# Patient Record
Sex: Female | Born: 1975 | Race: White | Hispanic: No | Marital: Married | Smoking: Former smoker
Health system: Southern US, Community
[De-identification: ages and names within clinical notes are randomized; demographics above are authoritative.]

## PROBLEM LIST (undated history)

## (undated) DIAGNOSIS — E162 Hypoglycemia, unspecified: Secondary | ICD-10-CM

## (undated) DIAGNOSIS — I10 Essential (primary) hypertension: Secondary | ICD-10-CM

## (undated) DIAGNOSIS — J302 Other seasonal allergic rhinitis: Secondary | ICD-10-CM

## (undated) DIAGNOSIS — G43109 Migraine with aura, not intractable, without status migrainosus: Secondary | ICD-10-CM

## (undated) DIAGNOSIS — A630 Anogenital (venereal) warts: Secondary | ICD-10-CM

## (undated) DIAGNOSIS — J45909 Unspecified asthma, uncomplicated: Secondary | ICD-10-CM

## (undated) DIAGNOSIS — Z973 Presence of spectacles and contact lenses: Secondary | ICD-10-CM

## (undated) DIAGNOSIS — E236 Other disorders of pituitary gland: Secondary | ICD-10-CM

## (undated) DIAGNOSIS — T7840XA Allergy, unspecified, initial encounter: Secondary | ICD-10-CM

## (undated) DIAGNOSIS — F419 Anxiety disorder, unspecified: Secondary | ICD-10-CM

## (undated) DIAGNOSIS — F32A Depression, unspecified: Secondary | ICD-10-CM

## (undated) DIAGNOSIS — K219 Gastro-esophageal reflux disease without esophagitis: Secondary | ICD-10-CM

## (undated) DIAGNOSIS — D649 Anemia, unspecified: Secondary | ICD-10-CM

## (undated) DIAGNOSIS — R002 Palpitations: Secondary | ICD-10-CM

## (undated) DIAGNOSIS — J189 Pneumonia, unspecified organism: Secondary | ICD-10-CM

## (undated) HISTORY — DX: Unspecified asthma, uncomplicated: J45.909

## (undated) HISTORY — DX: Allergy, unspecified, initial encounter: T78.40XA

## (undated) HISTORY — DX: Depression, unspecified: F32.A

## (undated) HISTORY — DX: Hypoglycemia, unspecified: E16.2

## (undated) HISTORY — DX: Migraine with aura, not intractable, without status migrainosus: G43.109

## (undated) HISTORY — PX: APPENDECTOMY: SHX54

## (undated) HISTORY — DX: Anogenital (venereal) warts: A63.0

## (undated) HISTORY — DX: Essential (primary) hypertension: I10

## (undated) HISTORY — DX: Gastro-esophageal reflux disease without esophagitis: K21.9

## (undated) HISTORY — PX: TONSILLECTOMY AND ADENOIDECTOMY: SUR1326

## (undated) HISTORY — DX: Anemia, unspecified: D64.9

## (undated) HISTORY — DX: Anxiety disorder, unspecified: F41.9

## (undated) HISTORY — DX: Other seasonal allergic rhinitis: J30.2

## (undated) HISTORY — PX: ABDOMINAL HYSTERECTOMY: SHX81

---
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
## 2001-04-24 HISTORY — PX: APPENDECTOMY: SHX54

## 2018-05-14 ENCOUNTER — Other Ambulatory Visit: Payer: Self-pay | Admitting: Family Medicine

## 2018-05-14 DIAGNOSIS — Z1231 Encounter for screening mammogram for malignant neoplasm of breast: Secondary | ICD-10-CM

## 2018-06-19 ENCOUNTER — Ambulatory Visit
Admission: RE | Admit: 2018-06-19 | Discharge: 2018-06-19 | Disposition: A | Discharge status: Home / Self Care | Payer: BLUE CROSS/BLUE SHIELD | Source: Ambulatory Visit | Attending: Family Medicine | Admitting: Family Medicine

## 2018-06-19 DIAGNOSIS — Z1231 Encounter for screening mammogram for malignant neoplasm of breast: Secondary | ICD-10-CM

## 2018-07-03 DIAGNOSIS — Z0001 Encounter for general adult medical examination with abnormal findings: Secondary | ICD-10-CM | POA: Diagnosis not present

## 2018-07-07 LAB — HM PAP SMEAR

## 2018-10-28 ENCOUNTER — Other Ambulatory Visit: Payer: Self-pay

## 2018-10-28 ENCOUNTER — Encounter: Payer: Self-pay | Admitting: Obstetrics and Gynecology

## 2018-10-28 ENCOUNTER — Ambulatory Visit: Payer: BC Managed Care – PPO | Admitting: Obstetrics and Gynecology

## 2018-10-28 VITALS — BP 120/80 | HR 80 | Temp 97.9°F | Resp 14 | Ht 65.25 in | Wt 180.0 lb

## 2018-10-28 DIAGNOSIS — R140 Abdominal distension (gaseous): Secondary | ICD-10-CM | POA: Diagnosis not present

## 2018-10-28 DIAGNOSIS — R1030 Lower abdominal pain, unspecified: Secondary | ICD-10-CM | POA: Diagnosis not present

## 2018-10-28 LAB — POCT URINALYSIS DIPSTICK
Bilirubin, UA: NEGATIVE
Blood, UA: NEGATIVE
Glucose, UA: NEGATIVE
Ketones, UA: NEGATIVE
Leukocytes, UA: NEGATIVE
Nitrite, UA: NEGATIVE
Protein, UA: NEGATIVE
Urobilinogen, UA: 0.2 E.U./dL
pH, UA: 5 (ref 5.0–8.0)

## 2018-10-28 MED ORDER — NORETHINDRONE 0.35 MG PO TABS: 1.0000 | ORAL_TABLET | Freq: Every day | ORAL | 0 refills | Status: DC

## 2018-10-28 NOTE — Patient Instructions (Signed)
Endometriosis  Endometriosis is a condition in which the tissue that lines the uterus (endometrium) grows outside of its normal location. The tissue may grow in many locations close to the uterus, but it commonly grows on the ovaries, fallopian tubes, vagina, or bowel. When the uterus sheds the endometrium every menstrual cycle, there is bleeding wherever the endometrial tissue is located. This can cause pain because blood is irritating to tissues that are not normally exposed to it. What are the causes? The cause of endometriosis is not known. What increases the risk? You may be more likely to develop endometriosis if you:  Have a family history of endometriosis.  Have never given birth.  Started your period at age 10 or younger.  Have high levels of estrogen in your body.  Were exposed to a certain medicine (diethylstilbestrol) before you were born (in utero).  Had low birth weight.  Were born as a twin, triplet, or other multiple.  Have a BMI of less than 25. BMI is an estimate of body fat and is calculated from height and weight. What are the signs or symptoms? Often, there are no symptoms of this condition. If you do have symptoms, they may:  Vary depending on where your endometrial tissue is growing.  Occur during your menstrual period (most common) or midcycle.  Come and go, or you may go months with no symptoms at all.  Stop with menopause. Symptoms may include:  Pain in the back or abdomen.  Heavier bleeding during periods.  Pain during sex.  Painful bowel movements.  Infertility.  Pelvic pain.  Bleeding more than once a month. How is this diagnosed? This condition is diagnosed based on your symptoms and a physical exam. You may have tests, such as:  Blood tests and urine tests. These may be done to help rule out other possible causes of your symptoms.  Ultrasound, to look for abnormal tissues.  An X-ray of the lower bowel (barium enema).  An  ultrasound that is done through the vagina (transvaginally).  CT scan.  MRI.  Laparoscopy. In this procedure, a lighted, pencil-sized instrument called a laparoscope is inserted into your abdomen through an incision. The laparoscope allows your health care provider to look at the organs inside your body and check for abnormal tissue to confirm the diagnosis. If abnormal tissue is found, your health care provider may remove a small piece of tissue (biopsy) to be examined under a microscope. How is this treated? Treatment for this condition may include:  Medicines to relieve pain, such as NSAIDs.  Hormone therapy. This involves using artificial (synthetic) hormones to reduce endometrial tissue growth. Your health care provider may recommend using a hormonal form of birth control, or other medicines.  Surgery. This may be done to remove abnormal endometrial tissue. ? In some cases, tissue may be removed using a laparoscope and a laser (laparoscopic laser treatment). ? In severe cases, surgery may be done to remove the fallopian tubes, uterus, and ovaries (hysterectomy). Follow these instructions at home:  Take over-the-counter and prescription medicines only as told by your health care provider.  Do not drive or use heavy machinery while taking prescription pain medicine.  Try to avoid activities that cause pain, including sexual activity.  Keep all follow-up visits as told by your health care provider. This is important. Contact a health care provider if:  You have pain in the area between your hip bones (pelvic area) that occurs: ? Before, during, or after your period. ?   In between your period and gets worse during your period. ? During or after sex. ? With bowel movements or urination, especially during your period.  You have problems getting pregnant.  You have a fever. Get help right away if:  You have severe pain that does not get better with medicine.  You have severe  nausea and vomiting, or you cannot eat without vomiting.  You have pain that affects only the lower, right side of your abdomen.  You have abdominal pain that gets worse.  You have abdominal swelling.  You have blood in your stool. This information is not intended to replace advice given to you by your health care provider. Make sure you discuss any questions you have with your health care provider. Document Released: 04/07/2000 Document Revised: 03/23/2017 Document Reviewed: 09/11/2015 Elsevier Patient Education  2020 [place]. Pelvic Pain, Female Pelvic pain is pain in your lower abdomen, below your belly button and between your hips. The pain may start suddenly (be acute), keep coming back (be recurring), or last a long time (become chronic). Pelvic pain that lasts longer than 6 months is considered chronic. Pelvic pain may affect your:  Reproductive organs.  Urinary system.  Digestive tract.  Musculoskeletal system. There are many potential causes of pelvic pain. Sometimes, the pain can be a result of digestive or urinary conditions, strained muscles or ligaments, or reproductive conditions. Sometimes the cause of pelvic pain is not known. Follow these instructions at home:   Take over-the-counter and prescription medicines only as told by your health care provider.  Rest as told by your health care provider.  Do not have sex if it hurts.  Keep a journal of your pelvic pain. Write down: ? When the pain started. ? Where the pain is located. ? What seems to make the pain better or worse, such as food or your period (menstrual cycle). ? Any symptoms you have along with the pain.  Keep all follow-up visits as told by your health care provider. This is important. Contact a health care provider if:  Medicine does not help your pain.  Your pain comes back.  You have new symptoms.  You have abnormal vaginal discharge or bleeding, including bleeding after menopause.   You have a fever or chills.  You are constipated.  You have blood in your urine or stool.  You have foul-smelling urine.  You feel weak or light-headed. Get help right away if:  You have sudden severe pain.  Your pain gets steadily worse.  You have severe pain along with fever, nausea, vomiting, or excessive sweating.  You lose consciousness. Summary  Pelvic pain is pain in your lower abdomen, below your belly button and between your hips.  There are many potential causes of pelvic pain.  Keep a journal of your pelvic pain. This information is not intended to replace advice given to you by your health care provider. Make sure you discuss any questions you have with your health care provider. Document Released: 03/07/2004 Document Revised: 09/26/2017 Document Reviewed: 09/26/2017 Elsevier Patient Education  2020 [employer].

## 2018-10-28 NOTE — Progress Notes (Signed)
GYNECOLOGY  VISIT   HPI: 43 y.o.   Married  Caucasian  female   G2P0002 with Patient's last menstrual period was 10/07/2018.   here as a new patient for lower abdominal and pelvic pain with bloating. Some frequency with urination.    States her symptoms since the beginning of the year.  RLQ pain since pandemic started.  Increased physical activity makes her aware that something is going on.   Some nausea with started her new OCP.  No vomiting.  No diarrhea.  Off and constipation, but not currently.  Lots of heartburn.   States she is not hungry ever.  No aversion to food.  Maintaining her weight.  She feels like her legs are thinning but the scale is the same.  She is running a lot.   Taking OCPs, low dose COCs, for the last 3 weeks.  She is spotting in between periods.  She has switched her pills to try to improve her cycles.  She states she feels like she ovulates every month.   She did a trial of Camilla, but it did not help enough.  She had mood swings and huge clots.  She does not want an IUD.   Had a pelvic US 1.5 years ago and it was normal.   No change in partner.  Same partner for 20 years.   She takes iron and vitamin D every day due to deficiencies of these.  She had labs with Dr. Darron Doom in Jan or Feb, and these were normal.  Vaginal delivery x 2.   Was living in [place] for many years.   Urine: negative  GYNECOLOGIC HISTORY: Patient's last menstrual period was 10/07/2018. Contraception:  Sronyx Menopausal hormone therapy:  none Last mammogram:  06/19/18 BIRADS 1 negative/density b Last pap smear:  About February 2020; normal per patient; done at [facility].         OB History    Gravida  2   Para  2   Term  0   Preterm  0   AB  0   Living  2     SAB  0   TAB  0   Ectopic  0   Multiple  0   Live Births  0              There are no active problems to display for this patient.   Past Medical History:   Diagnosis Date  . Genital warts    as a teen  . Hypoglycemia     Past Surgical History:  Procedure Laterality Date  . APPENDECTOMY    . TONSILLECTOMY AND ADENOIDECTOMY      Current Outpatient Medications  Medication Sig Dispense Refill  . Multiple Vitamin (MULTIVITAMIN) tablet Take 1 tablet by mouth daily.    . SRONYX 0.1-20 MG-MCG tablet Take 1 tablet by mouth daily.     No current facility-administered medications for this visit.      ALLERGIES: Erythromycin  Family History  Problem Relation Age of Onset  . Breast cancer Maternal Aunt   . Diabetes Maternal Aunt   . Hypertension Maternal Aunt   . Breast cancer Maternal Grandmother   . Breast cancer Paternal Grandmother   . Heart attack Father     Social History   Socioeconomic History  . Marital status: Married    Spouse name: Not on file  . Number of children: Not on file  . Years of education: Not on file  .  Highest education level: Not on file  Occupational History  . Not on file  Social Needs  . Financial resource strain: Not on file  . Food insecurity    Worry: Not on file    Inability: Not on file  . Transportation needs    Medical: Not on file    Non-medical: Not on file  Tobacco Use  . Smoking status: Former Smoker    Types: Cigarettes    Quit date: 04/24/2005    Years since quitting: 13.5  . Smokeless tobacco: Never Used  Substance and Sexual Activity  . Alcohol use: Yes    Comment: occ glass of wine  . Drug use: Never  . Sexual activity: Yes    Birth control/protection: Pill, Surgical    Comment: Sronyx/vasectomy  Lifestyle  . Physical activity    Days per week: Not on file    Minutes per session: Not on file  . Stress: Not on file  Relationships  . Social Herbalist on phone: Not on file    Gets together: Not on file    Attends religious service: Not on file    Active member of club or organization: Not on file    Attends meetings of clubs or organizations: Not on file     Relationship status: Not on file  . Intimate partner violence    Fear of current or ex partner: Not on file    Emotionally abused: Not on file    Physically abused: Not on file    Forced sexual activity: Not on file  Other Topics Concern  . Not on file  Social History Narrative  . Not on file    Review of Systems  Constitutional: Negative.   HENT: Negative.   Eyes: Negative.   Respiratory: Negative.   Cardiovascular: Negative.   Gastrointestinal: Positive for abdominal pain.  Endocrine: Negative.   Genitourinary: Positive for frequency.  Musculoskeletal: Negative.   Skin: Negative.   Allergic/Immunologic: Negative.   Neurological: Negative.   Hematological: Negative.   Psychiatric/Behavioral: Negative.     PHYSICAL EXAMINATION:    BP 120/80 (BP Location: Right Arm, Patient Position: Sitting, Cuff Size: Normal)   Pulse 80   Temp 97.9 F (36.6 C) (Temporal)   Resp 14   Ht 5' 5.25" (1.657 m)   Wt 180 lb (81.6 kg)   LMP 10/07/2018   BMI 29.72 kg/m     General appearance: alert, cooperative and appears stated age Head: Normocephalic, without obvious abnormality, atraumatic Neck: no adenopathy, supple, symmetrical, trachea midline and thyroid normal to inspection and palpation Lungs: clear to auscultation bilaterally Heart: regular rate and rhythm Abdomen: soft, non-tender, no masses,  no organomegaly Extremities: extremities normal, atraumatic, no cyanosis or edema Skin: Skin color, texture, turgor normal. No rashes or lesions No abnormal inguinal nodes palpated Neurologic: Grossly normal  Pelvic: External genitalia:  no lesions              Urethra:  normal appearing urethra with no masses, tenderness or lesions              Bartholins and Skenes: normal                 Vagina: normal appearing vagina with normal color and discharge, no lesions              Cervix: no lesions                Bimanual Exam:  Uterus:  normal size, contour, position, consistency,  mobility, non-tender              Adnexa: no mass, fullness, tenderness          Chaperone was present for exam.  ASSESSMENT  Pelvic pain.  RLQ pain.  Abdominal bloating.  Migraine with aura.   PLAN  We discussed pelvic pain and possible etiologies.  We focused on possible endometriosis.  Stop combined oral contraceptives due to hx of migraine with aura and increased risk of stroke. Return to Micronor, at least temporarily. Alternatives discussed.   Return for pelvic ultrasound.    An After Visit Summary was printed and given to the patient.  __45____ minutes face to face time of which over 50% was spent in counseling.

## 2018-10-29 ENCOUNTER — Other Ambulatory Visit: Payer: Self-pay

## 2018-10-31 ENCOUNTER — Ambulatory Visit (INDEPENDENT_AMBULATORY_CARE_PROVIDER_SITE_OTHER): Payer: BC Managed Care – PPO

## 2018-10-31 ENCOUNTER — Other Ambulatory Visit: Payer: Self-pay

## 2018-10-31 ENCOUNTER — Ambulatory Visit (INDEPENDENT_AMBULATORY_CARE_PROVIDER_SITE_OTHER): Payer: BC Managed Care – PPO | Admitting: Obstetrics and Gynecology

## 2018-10-31 VITALS — BP 126/62 | HR 59 | Ht 65.25 in | Wt 180.2 lb

## 2018-10-31 DIAGNOSIS — R102 Pelvic and perineal pain: Secondary | ICD-10-CM | POA: Diagnosis not present

## 2018-10-31 DIAGNOSIS — N926 Irregular menstruation, unspecified: Secondary | ICD-10-CM | POA: Diagnosis not present

## 2018-10-31 DIAGNOSIS — R1030 Lower abdominal pain, unspecified: Secondary | ICD-10-CM

## 2018-10-31 DIAGNOSIS — R140 Abdominal distension (gaseous): Secondary | ICD-10-CM

## 2018-10-31 DIAGNOSIS — R1031 Right lower quadrant pain: Secondary | ICD-10-CM

## 2018-10-31 MED ORDER — NORETHINDRONE 0.35 MG PO TABS: 1.0000 | ORAL_TABLET | Freq: Every day | ORAL | 0 refills | Status: DC

## 2018-10-31 NOTE — Progress Notes (Signed)
GYNECOLOGY  VISIT   HPI: 43 y.o.   Married  Caucasian  female   G2P0002 with Patient's last menstrual period was 10/07/2018.   here for   Pelvic ultrasound   Has RLQ pain since beginning of year.  Also reports pelvic pain and bloating.  She was on COCs but she has migraine with aura, so I recommend she stop this.   She used Camilla in the past and still had clots and mood swings.  She received an Rx for this at her office visit here this week.   This was to get her through until her [place] visit, which turned out to be today.   She has had some irregular bleeding when she was using her recent low dose COCs.  GYNECOLOGIC HISTORY: Patient's last menstrual period was 10/07/2018. Contraception:  POP. Menopausal hormone therapy:  none Last mammogram:  06/19/18 Bi-rads 1 Neg  Last pap smear:  10/28/18 not resulted.          OB History    Gravida  2   Para  2   Term  0   Preterm  0   AB  0   Living  2     SAB  0   TAB  0   Ectopic  0   Multiple  0   Live Births  0              There are no active problems to display for this patient.   Past Medical History:  Diagnosis Date  . Genital warts    as a teen  . Hypoglycemia     Past Surgical History:  Procedure Laterality Date  . APPENDECTOMY    . TONSILLECTOMY AND ADENOIDECTOMY      Current Outpatient Medications  Medication Sig Dispense Refill  . Multiple Vitamin (MULTIVITAMIN) tablet Take 1 tablet by mouth daily.    . norethindrone (MICRONOR) 0.35 MG tablet Take 1 tablet (0.35 mg total) by mouth daily. 1 Package 0   No current facility-administered medications for this visit.      ALLERGIES: Erythromycin  Family History  Problem Relation Age of Onset  . Breast cancer Maternal Aunt   . Diabetes Maternal Aunt   . Hypertension Maternal Aunt   . Breast cancer Maternal Grandmother   . Breast cancer Paternal Grandmother   . Heart attack Father     Social History   Socioeconomic History  . Marital  status: Married    Spouse name: Not on file  . Number of children: Not on file  . Years of education: Not on file  . Highest education level: Not on file  Occupational History  . Not on file  Social Needs  . Financial resource strain: Not on file  . Food insecurity    Worry: Not on file    Inability: Not on file  . Transportation needs    Medical: Not on file    Non-medical: Not on file  Tobacco Use  . Smoking status: Former Smoker    Types: Cigarettes    Quit date: 04/24/2005    Years since quitting: 13.5  . Smokeless tobacco: Never Used  Substance and Sexual Activity  . Alcohol use: Yes    Comment: occ glass of wine  . Drug use: Never  . Sexual activity: Yes    Birth control/protection: Pill, Surgical    Comment: Sronyx/vasectomy  Lifestyle  . Physical activity    Days per week: Not on file  Minutes per session: Not on file  . Stress: Not on file  Relationships  . Social Herbalist on phone: Not on file    Gets together: Not on file    Attends religious service: Not on file    Active member of club or organization: Not on file    Attends meetings of clubs or organizations: Not on file    Relationship status: Not on file  . Intimate partner violence    Fear of current or ex partner: Not on file    Emotionally abused: Not on file    Physically abused: Not on file    Forced sexual activity: Not on file  Other Topics Concern  . Not on file  Social History Narrative  . Not on file    Review of Systems  See HPI.  PHYSICAL EXAMINATION:    BP 126/62   Pulse (!) 59   Ht 5' 5.25" (1.657 m)   Wt 180 lb 3.2 oz (81.7 kg)   LMP 10/07/2018   SpO2 97%   BMI 29.76 kg/m     General appearance: alert, cooperative and appears stated age  Pelvic US Uterus no masses. EMS 3.67 mm. Left ovary with 21 mm cyst consistent with hemorrhagic CL.  Right ovary normal.  No free fluid.   ASSESSMENT RLQ pain.  Hx appendectomy.  Pelvic pain.  Bloating.  Irregular  menses.  Migraine with aura.   PLAN  We discussed her normal ultrasound findings but still the possibility of endometriosis.  Rx for Camilla 3 month.  Return for EMB if irregular bleeding persists.  She will call back in 3 months to give a progress report.   An After Visit Summary was printed and given to the patient.  __15____ minutes face to face time of which over 50% was spent in counseling.

## 2018-10-31 NOTE — Progress Notes (Signed)
Encounter reviewed by Dr. Aundria Rud.    Addendum; the left ovary w/ thin walled cyst - 21 mm mean,  containing scant debris, all avascular, c/w hemorrhagic c.l.

## 2018-11-03 ENCOUNTER — Encounter: Payer: Self-pay | Admitting: Obstetrics and Gynecology

## 2018-12-16 ENCOUNTER — Other Ambulatory Visit: Payer: Self-pay | Admitting: Obstetrics and Gynecology

## 2018-12-16 ENCOUNTER — Encounter: Payer: Self-pay | Admitting: Obstetrics and Gynecology

## 2018-12-17 MED ORDER — NORETHINDRONE 0.35 MG PO TABS: 1.0000 | ORAL_TABLET | Freq: Every day | ORAL | 0 refills | Status: DC

## 2018-12-17 NOTE — Telephone Encounter (Signed)
Call placed to [facility], spoke with Verdis Frederickson. Confirmed 2 refills on file for Micronor; however, their system is reporting an error, unable to fill, new RX is needed for 2 packages. 10/31/18 Rx cancelled for remaining 2 packages, new RX sent for Micronor 0.35 mg packages #2/0RF.     Call placed to patient. Patient states she is in her 2nd week of her second pack. One pack was sent on 7/6 and 3pkg sent on 7/9. Advised as seen above per pharmacy, patient to f/u with pharmacy for filling, return call to office if any further assistance is needed. OV scheduled for 3 mo f/u on 01/23/19 at 4pm with Dr. Quincy Simmonds.   Routing to provider for final review. Patient is agreeable to disposition. Will close encounter.

## 2018-12-17 NOTE — Telephone Encounter (Signed)
Hi! I wanted to let you know the birth control pills seem to be good. I haven't had any issues so far and my period was better this last month. Also less pain. Can I get a refill on them to last until a yearly visit?  Thank you!  Shirley Fleming

## 2018-12-17 NOTE — Telephone Encounter (Signed)
Please check on the status of this request.  I sent a 3 month prescription in to this pharmacy in July.  Patient needs a recheck prior to finishing her third pack of the Micronor.

## 2018-12-23 DIAGNOSIS — M530 Cervicocranial syndrome: Secondary | ICD-10-CM | POA: Diagnosis not present

## 2018-12-23 DIAGNOSIS — M9901 Segmental and somatic dysfunction of cervical region: Secondary | ICD-10-CM | POA: Diagnosis not present

## 2018-12-24 DIAGNOSIS — M530 Cervicocranial syndrome: Secondary | ICD-10-CM | POA: Diagnosis not present

## 2018-12-24 DIAGNOSIS — M9901 Segmental and somatic dysfunction of cervical region: Secondary | ICD-10-CM | POA: Diagnosis not present

## 2018-12-25 DIAGNOSIS — M9901 Segmental and somatic dysfunction of cervical region: Secondary | ICD-10-CM | POA: Diagnosis not present

## 2018-12-25 DIAGNOSIS — M530 Cervicocranial syndrome: Secondary | ICD-10-CM | POA: Diagnosis not present

## 2018-12-31 DIAGNOSIS — M530 Cervicocranial syndrome: Secondary | ICD-10-CM | POA: Diagnosis not present

## 2018-12-31 DIAGNOSIS — M9901 Segmental and somatic dysfunction of cervical region: Secondary | ICD-10-CM | POA: Diagnosis not present

## 2019-01-01 DIAGNOSIS — M530 Cervicocranial syndrome: Secondary | ICD-10-CM | POA: Diagnosis not present

## 2019-01-01 DIAGNOSIS — M9901 Segmental and somatic dysfunction of cervical region: Secondary | ICD-10-CM | POA: Diagnosis not present

## 2019-01-06 DIAGNOSIS — M530 Cervicocranial syndrome: Secondary | ICD-10-CM | POA: Diagnosis not present

## 2019-01-06 DIAGNOSIS — M9901 Segmental and somatic dysfunction of cervical region: Secondary | ICD-10-CM | POA: Diagnosis not present

## 2019-01-08 DIAGNOSIS — M9901 Segmental and somatic dysfunction of cervical region: Secondary | ICD-10-CM | POA: Diagnosis not present

## 2019-01-08 DIAGNOSIS — M530 Cervicocranial syndrome: Secondary | ICD-10-CM | POA: Diagnosis not present

## 2019-01-13 DIAGNOSIS — M530 Cervicocranial syndrome: Secondary | ICD-10-CM | POA: Diagnosis not present

## 2019-01-13 DIAGNOSIS — M9901 Segmental and somatic dysfunction of cervical region: Secondary | ICD-10-CM | POA: Diagnosis not present

## 2019-01-15 DIAGNOSIS — M530 Cervicocranial syndrome: Secondary | ICD-10-CM | POA: Diagnosis not present

## 2019-01-15 DIAGNOSIS — M9901 Segmental and somatic dysfunction of cervical region: Secondary | ICD-10-CM | POA: Diagnosis not present

## 2019-01-20 DIAGNOSIS — M9901 Segmental and somatic dysfunction of cervical region: Secondary | ICD-10-CM | POA: Diagnosis not present

## 2019-01-20 DIAGNOSIS — M530 Cervicocranial syndrome: Secondary | ICD-10-CM | POA: Diagnosis not present

## 2019-01-21 ENCOUNTER — Other Ambulatory Visit: Payer: Self-pay

## 2019-01-23 ENCOUNTER — Encounter: Payer: Self-pay | Admitting: Obstetrics and Gynecology

## 2019-01-23 ENCOUNTER — Ambulatory Visit: Payer: BC Managed Care – PPO | Admitting: Obstetrics and Gynecology

## 2019-01-23 ENCOUNTER — Other Ambulatory Visit: Payer: Self-pay

## 2019-01-23 VITALS — BP 120/74 | HR 70 | Temp 98.1°F | Ht 65.25 in | Wt 181.0 lb

## 2019-01-23 DIAGNOSIS — Z3041 Encounter for surveillance of contraceptive pills: Secondary | ICD-10-CM | POA: Diagnosis not present

## 2019-01-23 MED ORDER — NORETHINDRONE 0.35 MG PO TABS: 1.0000 | ORAL_TABLET | Freq: Every day | ORAL | 4 refills | Status: DC

## 2019-01-23 NOTE — Progress Notes (Signed)
GYNECOLOGY  VISIT   HPI: 43 y.o.   Married  Caucasian  female   G2P0002 with Patient's last menstrual period was 01/06/2019 (exact date).   here for 3 month follow up on POP.   Her menses were a week late this month.  No bleeding in between menses.  One day of moderately heavy bleeding.  No pain.  Bloating is better.  Acne is better than when she used other pills.  Moods more stable. Had a migraine x 3 days during her last menses.  Remembering to take her pills on time.   Dealing with vertigo.  Will see PCP tomorrow.  GYNECOLOGIC HISTORY: Patient's last menstrual period was 01/06/2019 (exact date). Contraception:  POP Menopausal hormone therapy:  n/a Last mammogram: 06/19/18 Bi-rads 1 Neg  Last pap smear: 05/2018 normal per patient        OB History    Gravida  2   Para  2   Term  0   Preterm  0   AB  0   Living  2     SAB  0   TAB  0   Ectopic  0   Multiple  0   Live Births  0              There are no active problems to display for this patient.   Past Medical History:  Diagnosis Date  . Genital warts    as a teen  . Hypoglycemia   . Migraine with aura     Past Surgical History:  Procedure Laterality Date  . APPENDECTOMY    . TONSILLECTOMY AND ADENOIDECTOMY      Current Outpatient Medications  Medication Sig Dispense Refill  . cholecalciferol (VITAMIN D3) 25 MCG (1000 UT) tablet Take 1,000 Units by mouth daily.    . ferrous sulfate 324 MG TBEC Take 65 mg by mouth.    . Multiple Vitamin (MULTIVITAMIN) tablet Take 1 tablet by mouth daily.    . norethindrone (MICRONOR) 0.35 MG tablet Take 1 tablet (0.35 mg total) by mouth daily. 1 Package 4   No current facility-administered medications for this visit.      ALLERGIES: Erythromycin  Family History  Problem Relation Age of Onset  . Breast cancer Maternal Aunt   . Diabetes Maternal Aunt   . Hypertension Maternal Aunt   . Breast cancer Maternal Grandmother   . Breast cancer Paternal  Grandmother   . Heart attack Father     Social History   Socioeconomic History  . Marital status: Married    Spouse name: Not on file  . Number of children: Not on file  . Years of education: Not on file  . Highest education level: Not on file  Occupational History  . Not on file  Social Needs  . Financial resource strain: Not on file  . Food insecurity    Worry: Not on file    Inability: Not on file  . Transportation needs    Medical: Not on file    Non-medical: Not on file  Tobacco Use  . Smoking status: Former Smoker    Types: Cigarettes    Quit date: 04/24/2005    Years since quitting: 13.7  . Smokeless tobacco: Never Used  Substance and Sexual Activity  . Alcohol use: Yes    Comment: occ glass of wine  . Drug use: Never  . Sexual activity: Yes    Birth control/protection: Pill, Surgical    Comment: Sronyx/vasectomy  Lifestyle  .  Physical activity    Days per week: Not on file    Minutes per session: Not on file  . Stress: Not on file  Relationships  . Social Herbalist on phone: Not on file    Gets together: Not on file    Attends religious service: Not on file    Active member of club or organization: Not on file    Attends meetings of clubs or organizations: Not on file    Relationship status: Not on file  . Intimate partner violence    Fear of current or ex partner: Not on file    Emotionally abused: Not on file    Physically abused: Not on file    Forced sexual activity: Not on file  Other Topics Concern  . Not on file  Social History Narrative  . Not on file    Review of Systems  All other systems reviewed and are negative.   PHYSICAL EXAMINATION:    BP 120/74   Pulse 70   Temp 98.1 F (36.7 C) (Temporal)   Ht 5' 5.25" (1.657 m)   Wt 181 lb (82.1 kg)   LMP 01/06/2019 (Exact Date)   BMI 29.89 kg/m     General appearance: alert, cooperative and appears stated age   ASSESSMENT  Migraine with aura.  Hx pelvic pain,  controlled on Micronor. Tolerating Micronor well.  Menses regular now.   PLAN  Continue Micronor.  No EMB needed. FU in March for annual exam.    An After Visit Summary was printed and given to the patient.  _15_____ minutes face to face time of which over 50% was spent in counseling.

## 2019-01-24 DIAGNOSIS — H6693 Otitis media, unspecified, bilateral: Secondary | ICD-10-CM | POA: Diagnosis not present

## 2019-01-26 ENCOUNTER — Encounter: Payer: Self-pay | Admitting: Obstetrics and Gynecology

## 2019-01-27 DIAGNOSIS — M9901 Segmental and somatic dysfunction of cervical region: Secondary | ICD-10-CM | POA: Diagnosis not present

## 2019-01-27 DIAGNOSIS — M530 Cervicocranial syndrome: Secondary | ICD-10-CM | POA: Diagnosis not present

## 2019-02-03 DIAGNOSIS — M9901 Segmental and somatic dysfunction of cervical region: Secondary | ICD-10-CM | POA: Diagnosis not present

## 2019-02-03 DIAGNOSIS — M530 Cervicocranial syndrome: Secondary | ICD-10-CM | POA: Diagnosis not present

## 2019-02-04 DIAGNOSIS — Z23 Encounter for immunization: Secondary | ICD-10-CM | POA: Diagnosis not present

## 2019-02-04 DIAGNOSIS — J309 Allergic rhinitis, unspecified: Secondary | ICD-10-CM | POA: Diagnosis not present

## 2019-02-10 DIAGNOSIS — M9901 Segmental and somatic dysfunction of cervical region: Secondary | ICD-10-CM | POA: Diagnosis not present

## 2019-02-10 DIAGNOSIS — M530 Cervicocranial syndrome: Secondary | ICD-10-CM | POA: Diagnosis not present

## 2019-02-12 DIAGNOSIS — M530 Cervicocranial syndrome: Secondary | ICD-10-CM | POA: Diagnosis not present

## 2019-02-12 DIAGNOSIS — M9901 Segmental and somatic dysfunction of cervical region: Secondary | ICD-10-CM | POA: Diagnosis not present

## 2019-05-06 DIAGNOSIS — H8112 Benign paroxysmal vertigo, left ear: Secondary | ICD-10-CM | POA: Diagnosis not present

## 2019-05-20 DIAGNOSIS — I10 Essential (primary) hypertension: Secondary | ICD-10-CM | POA: Diagnosis not present

## 2019-05-20 DIAGNOSIS — R232 Flushing: Secondary | ICD-10-CM | POA: Diagnosis not present

## 2019-05-20 DIAGNOSIS — G43109 Migraine with aura, not intractable, without status migrainosus: Secondary | ICD-10-CM | POA: Diagnosis not present

## 2019-05-20 DIAGNOSIS — F419 Anxiety disorder, unspecified: Secondary | ICD-10-CM | POA: Diagnosis not present

## 2019-05-21 LAB — LIPID PANEL
Cholesterol: 161 (ref 0–200)
HDL: 47 (ref 35–70)
LDL Cholesterol: 97
Triglycerides: 87 (ref 40–160)

## 2019-05-26 DIAGNOSIS — R232 Flushing: Secondary | ICD-10-CM | POA: Diagnosis not present

## 2019-05-29 DIAGNOSIS — R232 Flushing: Secondary | ICD-10-CM | POA: Diagnosis not present

## 2019-06-03 DIAGNOSIS — H8112 Benign paroxysmal vertigo, left ear: Secondary | ICD-10-CM | POA: Diagnosis not present

## 2019-06-24 ENCOUNTER — Ambulatory Visit: Payer: BC Managed Care – PPO | Admitting: Obstetrics and Gynecology

## 2019-06-25 ENCOUNTER — Other Ambulatory Visit: Payer: Self-pay | Admitting: *Deleted

## 2019-06-25 MED ORDER — NORETHINDRONE 0.35 MG PO TABS: 1.0000 | ORAL_TABLET | Freq: Every day | ORAL | 0 refills | Status: DC

## 2019-06-25 NOTE — Telephone Encounter (Signed)
Medication refill request: Norethindrone  Last OV:  01-23-2019 BS Next AEX: 07-08-19 Last MMG (if hormonal medication request): 06-19-2018 density B/BIRADS 1 negative  Refill authorized: Today, please advise.    Medication pended for #1, 0RF. Please refill if appropriate.

## 2019-07-08 ENCOUNTER — Encounter: Payer: Self-pay | Admitting: Obstetrics and Gynecology

## 2019-07-08 ENCOUNTER — Other Ambulatory Visit (HOSPITAL_COMMUNITY)
Admission: RE | Admit: 2019-07-08 | Discharge: 2019-07-08 | Disposition: A | Discharge status: Home / Self Care | Payer: BC Managed Care – PPO | Source: Ambulatory Visit | Attending: Obstetrics and Gynecology | Admitting: Obstetrics and Gynecology

## 2019-07-08 ENCOUNTER — Ambulatory Visit: Payer: BC Managed Care – PPO | Admitting: Obstetrics and Gynecology

## 2019-07-08 ENCOUNTER — Other Ambulatory Visit: Payer: Self-pay

## 2019-07-08 VITALS — BP 124/82 | HR 60 | Temp 97.0°F | Resp 14 | Ht 65.0 in | Wt 190.6 lb

## 2019-07-08 DIAGNOSIS — N760 Acute vaginitis: Secondary | ICD-10-CM | POA: Diagnosis not present

## 2019-07-08 DIAGNOSIS — Z01419 Encounter for gynecological examination (general) (routine) without abnormal findings: Secondary | ICD-10-CM | POA: Insufficient documentation

## 2019-07-08 MED ORDER — NORETHINDRONE 0.35 MG PO TABS: 1.0000 | ORAL_TABLET | Freq: Every day | ORAL | 3 refills | Status: DC

## 2019-07-08 NOTE — Patient Instructions (Signed)

## 2019-07-08 NOTE — Progress Notes (Signed)
44 y.o. G49P0002 Married Caucasian female here for annual exam.    Patient complaining of greenish vaginal discharge. No itching or odor.  Wants evaluation.   Reports midcycle ovulatory mucous usually.   She thinks she had Covid in February, 2020 before testing was available.  She had 2 periods that month.   She is now taking Micronor and having regular menses, but it took a few months to regulate.   PCP: Horald Pollen, MD    Patient's last menstrual period was 06/14/2019 (approximate).           Sexually active: Yes.    The current method of family planning is oral progesterone-only contraceptive.    Exercising: Yes.    walks or runs 2-3 miles daily Smoker:  Former  Health Maintenance: Pap: 2019?     History of abnormal Pap:  Yes, hx cryotherapy age 77.  Last abnormal pap was in her 91s.  She had regular paps following this.  MMG:06-19-18 Neg/density B/BiRads1-- needs to scheduled Colonoscopy:  n/a BMD:   n/a  Result  n/a TDaP: will [facility]--going to get COVID vaccine Gardasil:   no HIV: Neg in pregnancy Hep C:unsure Screening Labs:  PCP.    reports that she quit smoking about 14 years ago. Her smoking use included cigarettes. She has never used smokeless tobacco. She reports current alcohol use of about 2.0 standard drinks of alcohol per week. She reports that she does not use drugs.  Past Medical History:  Diagnosis Date  . Genital warts    as a teen  . Hypertension   . Hypoglycemia   . Migraine with aura     Past Surgical History:  Procedure Laterality Date  . APPENDECTOMY    . TONSILLECTOMY AND ADENOIDECTOMY      Current Outpatient Medications  Medication Sig Dispense Refill  . ALPRAZolam (XANAX) 0.25 MG tablet Take 0.25 mg by mouth every 8 (eight) hours as needed.    Marland Kitchen BYSTOLIC 5 MG tablet Take 5 mg by mouth daily.    . cholecalciferol (VITAMIN D3) 25 MCG (1000 UT) tablet Take 1,000 Units by mouth daily.    . diazepam (VALIUM) 2 MG tablet Take 2 mg by  mouth 3 (three) times daily as needed.    . ferrous sulfate 324 MG TBEC Take 65 mg by mouth.    . Multiple Vitamin (MULTIVITAMIN) tablet Take 1 tablet by mouth daily.    . norethindrone (MICRONOR) 0.35 MG tablet Take 1 tablet (0.35 mg total) by mouth daily. 1 Package 0   No current facility-administered medications for this visit.    Family History  Problem Relation Age of Onset  . Breast cancer Maternal Aunt   . Diabetes Maternal Aunt   . Hypertension Maternal Aunt   . Breast cancer Maternal Grandmother   . Breast cancer Paternal Grandmother   . Heart attack Father     Review of Systems  All other systems reviewed and are negative.   Exam:   BP 124/82   Pulse 60   Temp (!) 97 F (36.1 C) (Temporal)   Resp 14   Ht 5\' 5"  (1.651 m)   Wt 190 lb 9.6 oz (86.5 kg)   LMP 06/14/2019 (Approximate)   BMI 31.72 kg/m     General appearance: alert, cooperative and appears stated age Head: normocephalic, without obvious abnormality, atraumatic Neck: no adenopathy, supple, symmetrical, trachea midline and thyroid normal to inspection and palpation Lungs: clear to auscultation bilaterally Breasts: normal appearance, no  masses or tenderness, No nipple retraction or dimpling, No nipple discharge or bleeding, No axillary adenopathy Heart: regular rate and rhythm Abdomen: soft, non-tender; no masses, no organomegaly Extremities: extremities normal, atraumatic, no cyanosis or edema Skin: skin color, texture, turgor normal. No rashes or lesions Lymph nodes: cervical, supraclavicular, and axillary nodes normal. Neurologic: grossly normal  Pelvic: External genitalia:  no lesions              No abnormal inguinal nodes palpated.              Urethra:  normal appearing urethra with no masses, tenderness or lesions              Bartholins and Skenes: normal                 Vagina: normal appearing vagina with normal color and discharge, no lesions              Cervix: no lesions               Pap taken: Yes.   Bimanual Exam:  Uterus:  normal size, contour, position, consistency, mobility, non-tender              Adnexa: no mass, fullness, tenderness              Rectal exam: Yes.  .  Confirms.              Anus:  normal sphincter tone, no lesions  Chaperone was present for exam.  Assessment:   Well woman visit with normal exam. Hx cryotherapy to cervix in remote past.  Vaginitis.  FH breast cancer.  HTN.  Taking Bystolic.  Anxiety.     Plan: Mammogram screening discussed. Self breast awareness reviewed. Pap and HR HPV as above. Guidelines for Calcium, Vitamin D, regular exercise program including cardiovascular and weight bearing exercise. Refill of Micronor. Rx for 90 day and 3 refills. Affirm. Labs with PCP.  We discussed potential genetic counseling and testing.  She will consider. Follow up annually and prn.   After visit summary provided.

## 2019-07-09 LAB — VAGINITIS/VAGINOSIS, DNA PROBE
Candida Species: NEGATIVE
Gardnerella vaginalis: NEGATIVE
Trichomonas vaginosis: NEGATIVE

## 2019-07-09 LAB — CYTOLOGY - PAP
Comment: NEGATIVE
Diagnosis: NEGATIVE
High risk HPV: NEGATIVE

## 2019-10-28 DIAGNOSIS — F419 Anxiety disorder, unspecified: Secondary | ICD-10-CM | POA: Diagnosis not present

## 2019-11-04 DIAGNOSIS — M9902 Segmental and somatic dysfunction of thoracic region: Secondary | ICD-10-CM | POA: Diagnosis not present

## 2019-11-04 DIAGNOSIS — M47812 Spondylosis without myelopathy or radiculopathy, cervical region: Secondary | ICD-10-CM | POA: Diagnosis not present

## 2019-11-04 DIAGNOSIS — M9901 Segmental and somatic dysfunction of cervical region: Secondary | ICD-10-CM | POA: Diagnosis not present

## 2019-11-04 DIAGNOSIS — M9903 Segmental and somatic dysfunction of lumbar region: Secondary | ICD-10-CM | POA: Diagnosis not present

## 2019-11-05 DIAGNOSIS — M47812 Spondylosis without myelopathy or radiculopathy, cervical region: Secondary | ICD-10-CM | POA: Diagnosis not present

## 2019-11-05 DIAGNOSIS — M9903 Segmental and somatic dysfunction of lumbar region: Secondary | ICD-10-CM | POA: Diagnosis not present

## 2019-11-05 DIAGNOSIS — M9902 Segmental and somatic dysfunction of thoracic region: Secondary | ICD-10-CM | POA: Diagnosis not present

## 2019-11-05 DIAGNOSIS — M9901 Segmental and somatic dysfunction of cervical region: Secondary | ICD-10-CM | POA: Diagnosis not present

## 2019-11-10 DIAGNOSIS — M47812 Spondylosis without myelopathy or radiculopathy, cervical region: Secondary | ICD-10-CM | POA: Diagnosis not present

## 2019-11-10 DIAGNOSIS — M9903 Segmental and somatic dysfunction of lumbar region: Secondary | ICD-10-CM | POA: Diagnosis not present

## 2019-11-10 DIAGNOSIS — M9901 Segmental and somatic dysfunction of cervical region: Secondary | ICD-10-CM | POA: Diagnosis not present

## 2019-11-10 DIAGNOSIS — M9902 Segmental and somatic dysfunction of thoracic region: Secondary | ICD-10-CM | POA: Diagnosis not present

## 2019-11-12 DIAGNOSIS — M47812 Spondylosis without myelopathy or radiculopathy, cervical region: Secondary | ICD-10-CM | POA: Diagnosis not present

## 2019-11-12 DIAGNOSIS — M9901 Segmental and somatic dysfunction of cervical region: Secondary | ICD-10-CM | POA: Diagnosis not present

## 2019-11-12 DIAGNOSIS — M9902 Segmental and somatic dysfunction of thoracic region: Secondary | ICD-10-CM | POA: Diagnosis not present

## 2019-11-12 DIAGNOSIS — M9903 Segmental and somatic dysfunction of lumbar region: Secondary | ICD-10-CM | POA: Diagnosis not present

## 2019-11-24 DIAGNOSIS — M9903 Segmental and somatic dysfunction of lumbar region: Secondary | ICD-10-CM | POA: Diagnosis not present

## 2019-11-24 DIAGNOSIS — M9901 Segmental and somatic dysfunction of cervical region: Secondary | ICD-10-CM | POA: Diagnosis not present

## 2019-11-24 DIAGNOSIS — M47812 Spondylosis without myelopathy or radiculopathy, cervical region: Secondary | ICD-10-CM | POA: Diagnosis not present

## 2019-11-24 DIAGNOSIS — M9902 Segmental and somatic dysfunction of thoracic region: Secondary | ICD-10-CM | POA: Diagnosis not present

## 2019-12-02 DIAGNOSIS — M47812 Spondylosis without myelopathy or radiculopathy, cervical region: Secondary | ICD-10-CM | POA: Diagnosis not present

## 2019-12-02 DIAGNOSIS — M9901 Segmental and somatic dysfunction of cervical region: Secondary | ICD-10-CM | POA: Diagnosis not present

## 2019-12-02 DIAGNOSIS — M9902 Segmental and somatic dysfunction of thoracic region: Secondary | ICD-10-CM | POA: Diagnosis not present

## 2019-12-02 DIAGNOSIS — M9903 Segmental and somatic dysfunction of lumbar region: Secondary | ICD-10-CM | POA: Diagnosis not present

## 2019-12-09 DIAGNOSIS — M9901 Segmental and somatic dysfunction of cervical region: Secondary | ICD-10-CM | POA: Diagnosis not present

## 2019-12-09 DIAGNOSIS — M9903 Segmental and somatic dysfunction of lumbar region: Secondary | ICD-10-CM | POA: Diagnosis not present

## 2019-12-09 DIAGNOSIS — M47812 Spondylosis without myelopathy or radiculopathy, cervical region: Secondary | ICD-10-CM | POA: Diagnosis not present

## 2019-12-09 DIAGNOSIS — M9902 Segmental and somatic dysfunction of thoracic region: Secondary | ICD-10-CM | POA: Diagnosis not present

## 2019-12-16 DIAGNOSIS — M9901 Segmental and somatic dysfunction of cervical region: Secondary | ICD-10-CM | POA: Diagnosis not present

## 2019-12-16 DIAGNOSIS — M9903 Segmental and somatic dysfunction of lumbar region: Secondary | ICD-10-CM | POA: Diagnosis not present

## 2019-12-16 DIAGNOSIS — M9902 Segmental and somatic dysfunction of thoracic region: Secondary | ICD-10-CM | POA: Diagnosis not present

## 2019-12-16 DIAGNOSIS — M47812 Spondylosis without myelopathy or radiculopathy, cervical region: Secondary | ICD-10-CM | POA: Diagnosis not present

## 2019-12-30 DIAGNOSIS — M9902 Segmental and somatic dysfunction of thoracic region: Secondary | ICD-10-CM | POA: Diagnosis not present

## 2019-12-30 DIAGNOSIS — M9901 Segmental and somatic dysfunction of cervical region: Secondary | ICD-10-CM | POA: Diagnosis not present

## 2019-12-30 DIAGNOSIS — M9903 Segmental and somatic dysfunction of lumbar region: Secondary | ICD-10-CM | POA: Diagnosis not present

## 2019-12-30 DIAGNOSIS — M47812 Spondylosis without myelopathy or radiculopathy, cervical region: Secondary | ICD-10-CM | POA: Diagnosis not present

## 2020-01-13 DIAGNOSIS — M9902 Segmental and somatic dysfunction of thoracic region: Secondary | ICD-10-CM | POA: Diagnosis not present

## 2020-01-13 DIAGNOSIS — M47812 Spondylosis without myelopathy or radiculopathy, cervical region: Secondary | ICD-10-CM | POA: Diagnosis not present

## 2020-01-13 DIAGNOSIS — M9901 Segmental and somatic dysfunction of cervical region: Secondary | ICD-10-CM | POA: Diagnosis not present

## 2020-01-13 DIAGNOSIS — M9903 Segmental and somatic dysfunction of lumbar region: Secondary | ICD-10-CM | POA: Diagnosis not present

## 2020-01-14 DIAGNOSIS — M9903 Segmental and somatic dysfunction of lumbar region: Secondary | ICD-10-CM | POA: Diagnosis not present

## 2020-01-14 DIAGNOSIS — M9901 Segmental and somatic dysfunction of cervical region: Secondary | ICD-10-CM | POA: Diagnosis not present

## 2020-01-14 DIAGNOSIS — M47812 Spondylosis without myelopathy or radiculopathy, cervical region: Secondary | ICD-10-CM | POA: Diagnosis not present

## 2020-01-14 DIAGNOSIS — M9902 Segmental and somatic dysfunction of thoracic region: Secondary | ICD-10-CM | POA: Diagnosis not present

## 2020-01-20 DIAGNOSIS — M9902 Segmental and somatic dysfunction of thoracic region: Secondary | ICD-10-CM | POA: Diagnosis not present

## 2020-01-20 DIAGNOSIS — M47812 Spondylosis without myelopathy or radiculopathy, cervical region: Secondary | ICD-10-CM | POA: Diagnosis not present

## 2020-01-20 DIAGNOSIS — M9903 Segmental and somatic dysfunction of lumbar region: Secondary | ICD-10-CM | POA: Diagnosis not present

## 2020-01-20 DIAGNOSIS — M9901 Segmental and somatic dysfunction of cervical region: Secondary | ICD-10-CM | POA: Diagnosis not present

## 2020-02-03 DIAGNOSIS — M9901 Segmental and somatic dysfunction of cervical region: Secondary | ICD-10-CM | POA: Diagnosis not present

## 2020-02-03 DIAGNOSIS — M47812 Spondylosis without myelopathy or radiculopathy, cervical region: Secondary | ICD-10-CM | POA: Diagnosis not present

## 2020-02-03 DIAGNOSIS — M9902 Segmental and somatic dysfunction of thoracic region: Secondary | ICD-10-CM | POA: Diagnosis not present

## 2020-02-03 DIAGNOSIS — M9903 Segmental and somatic dysfunction of lumbar region: Secondary | ICD-10-CM | POA: Diagnosis not present

## 2020-03-04 ENCOUNTER — Telehealth: Payer: Self-pay

## 2020-03-04 NOTE — Telephone Encounter (Signed)
AEX 06/2019- BS  H/o migraines with aura  H/o pelvic pain, PUS 10/2018-normal, with possibility of endometriosis.   Spoke with pt. Pt states having irregular bleeding with having a cycle or BTB vaginal bleeding evert 2 weeks since mid August. Pt states started back on Micronor 10/2018. Pt reports having abd bloating and cramping every day, " all the time". Pt states on BTB days, has light pink bleeding that will last up to 5 days, changing a panty liner or light tampon twice a day. On heavier cycle days, changing pad or tampon every 1-2 hours. Denies any soaking pads or tampons, clots, feeling dizzy or weak. Pt denies any skipped or missed pills. Did miss 1 pill in Sept. Pt denies any pregnancy chances due to husband's vasectomy. Pt denies using any OTC meds for treatment.   Advised pt to have OV for follow up. Pt agreeable. Pt scheduled with Dr Quincy Simmonds on 11/18 at 830 am. Pt agreeable to date and time of appt.  Routing to Dr Quincy Simmonds for update Encounter closed.

## 2020-03-04 NOTE — Telephone Encounter (Signed)
Patient is calling in regards to having irregular bleeding.

## 2020-03-09 NOTE — Progress Notes (Signed)
GYNECOLOGY  VISIT   HPI: 44 y.o.   Married  Caucasian  female   G2P0002 with Patient's last menstrual period was 03/07/2020.   here for irregular bleeding on POP.   Patient began having irregular bleeding late spring, early summer. She continues to have BTB on POP. Menses every 3 - 4 weeks and in between.  Only three days this month she has not bled.   6/7 - 6/14 7/3 - 7/10 7/18 - 7/24 Spotted 7/25 - 7/29 8/14- 8/22 9/9 - 9/16 10/1 - 10/10 Spotting 10/17 - 10/20 10/30 - 11/3 Spotting 11/6 -11/10 Spotting 11/14 - today  Only one missed pill in September, and had spotting that day.  Otherwise no late pills.  Cycles are really heavy off birth control.   Has bloating and cramping all of the time.  Feels uncomfortable.   Normal pelvic US 10/31/19.  She had EMS 3.67 mm.  Left hemorrhagic corpus luteum cyst.  She had breakthrough bleeding on COCs also.  Not interested in Mirena IUD.   Occasional migraine during her menses treated with Excedrin migraine.  Has visual floaters prior to her HA.   Patient was started on antiHTN when she had elevated blood pressure while taking pseudoephedrine.  She takes her antiHTN every 3 days and it makes her feel really tired.   She is looking for a new PCP.   UPT:Neg  GYNECOLOGIC HISTORY: Patient's last menstrual period was 03/07/2020. Contraception: Vasectomy/POP Menopausal hormone therapy:  none Last mammogram: 06-19-18 Neg/density B/BiRads1 Last pap smear: 07-08-19 Neg:Neg HR HPV, 2019? normal        OB History    Gravida  2   Para  2   Term  0   Preterm  0   AB  0   Living  2     SAB  0   TAB  0   Ectopic  0   Multiple  0   Live Births  0              There are no problems to display for this patient.   Past Medical History:  Diagnosis Date  . Genital warts    as a teen  . Hypertension   . Hypoglycemia   . Migraine with aura     Past Surgical History:  Procedure Laterality Date  . APPENDECTOMY     . TONSILLECTOMY AND ADENOIDECTOMY      Current Outpatient Medications  Medication Sig Dispense Refill  . ALPRAZolam (XANAX) 0.25 MG tablet Take 0.25 mg by mouth every 8 (eight) hours as needed.    Marland Kitchen BYSTOLIC 5 MG tablet Take 5 mg by mouth daily.    . cholecalciferol (VITAMIN D3) 25 MCG (1000 UT) tablet Take 1,000 Units by mouth daily.    . diazepam (VALIUM) 2 MG tablet Take 2 mg by mouth 3 (three) times daily as needed.    . ferrous sulfate 324 MG TBEC Take 65 mg by mouth.    . magnesium 30 MG tablet Take 30 mg by mouth daily.    . Multiple Vitamin (MULTIVITAMIN) tablet Take 1 tablet by mouth daily.    . norethindrone (MICRONOR) 0.35 MG tablet Take 1 tablet (0.35 mg total) by mouth daily. 3 Package 3   No current facility-administered medications for this visit.     ALLERGIES: Erythromycin  Family History  Problem Relation Age of Onset  . Breast cancer Maternal Aunt   . Diabetes Maternal Aunt   . Hypertension Maternal Aunt   .  Breast cancer Maternal Grandmother   . Breast cancer Paternal Grandmother   . Heart attack Father     Social History   Socioeconomic History  . Marital status: Married    Spouse name: Not on file  . Number of children: Not on file  . Years of education: Not on file  . Highest education level: Not on file  Occupational History  . Not on file  Tobacco Use  . Smoking status: Former Smoker    Types: Cigarettes    Quit date: 04/24/2005    Years since quitting: 14.8  . Smokeless tobacco: Never Used  Vaping Use  . Vaping Use: Never used  Substance and Sexual Activity  . Alcohol use: Yes    Alcohol/week: 2.0 standard drinks    Types: 2 Glasses of wine per week    Comment: occ glass of wine  . Drug use: Never  . Sexual activity: Yes    Birth control/protection: Pill, Surgical    Comment: Micronor/vasectomy  Other Topics Concern  . Not on file  Social History Narrative  . Not on file   Social Determinants of Health   Financial Resource  Strain:   . Difficulty of Paying Living Expenses: Not on file  Food Insecurity:   . Worried About Charity fundraiser in the Last Year: Not on file  . Ran Out of Food in the Last Year: Not on file  Transportation Needs:   . Lack of Transportation (Medical): Not on file  . Lack of Transportation (Non-Medical): Not on file  Physical Activity:   . Days of Exercise per Week: Not on file  . Minutes of Exercise per Session: Not on file  Stress:   . Feeling of Stress : Not on file  Social Connections:   . Frequency of Communication with Friends and Family: Not on file  . Frequency of Social Gatherings with Friends and Family: Not on file  . Attends Religious Services: Not on file  . Active Member of Clubs or Organizations: Not on file  . Attends Archivist Meetings: Not on file  . Marital Status: Not on file  Intimate Partner Violence:   . Fear of Current or Ex-Partner: Not on file  . Emotionally Abused: Not on file  . Physically Abused: Not on file  . Sexually Abused: Not on file    Review of Systems  All other systems reviewed and are negative.   PHYSICAL EXAMINATION:    BP 100/70   Pulse 72   Ht 5' 5.25" (1.657 m)   Wt 184 lb (83.5 kg)   LMP 03/07/2020   SpO2 98%   BMI 30.39 kg/m     General appearance: alert, cooperative and appears stated age   Pelvic: External genitalia:  no lesions              Urethra:  normal appearing urethra with no masses, tenderness or lesions              Bartholins and Skenes: normal                 Vagina: normal appearing vagina with normal color and discharge, no lesions              Cervix: no lesions.  mucousy blood noted.                 Bimanual Exam:  Uterus:  normal size, contour, position, consistency, mobility, non-tender  Adnexa: no mass, fullness, tenderness               Chaperone was present for exam.  ASSESSMENT  Irregular menses on COCs and POPs.  Abdominal bloating. Migraine with aura? HTN.    PLAN  We discussed possible reasons for abnormal bleeding - polyps, cysts, fibroids, endometriosis.  Will return for sonohysterogram, EMB.  We discussed treatment options - stopping POPs, Depo Provera, endometrial ablation, hysterectomy.  She will continue her POPs for now.  List of PCPs to patient.    28 min total time was spent for this patient encounter, including preparation, face-to-face counseling with the patient, coordination of care, and documentation of the encounter.

## 2020-03-11 ENCOUNTER — Other Ambulatory Visit: Payer: Self-pay

## 2020-03-11 ENCOUNTER — Encounter: Payer: Self-pay | Admitting: Obstetrics and Gynecology

## 2020-03-11 ENCOUNTER — Ambulatory Visit: Payer: BC Managed Care – PPO | Admitting: Obstetrics and Gynecology

## 2020-03-11 VITALS — BP 100/70 | HR 72 | Ht 65.25 in | Wt 184.0 lb

## 2020-03-11 DIAGNOSIS — N921 Excessive and frequent menstruation with irregular cycle: Secondary | ICD-10-CM

## 2020-03-11 LAB — POCT URINE PREGNANCY: Preg Test, Ur: NEGATIVE

## 2020-04-29 DIAGNOSIS — Z20822 Contact with and (suspected) exposure to covid-19: Secondary | ICD-10-CM | POA: Diagnosis not present

## 2020-05-12 DIAGNOSIS — M9901 Segmental and somatic dysfunction of cervical region: Secondary | ICD-10-CM | POA: Diagnosis not present

## 2020-05-12 DIAGNOSIS — S43421A Sprain of right rotator cuff capsule, initial encounter: Secondary | ICD-10-CM | POA: Diagnosis not present

## 2020-05-12 DIAGNOSIS — M5412 Radiculopathy, cervical region: Secondary | ICD-10-CM | POA: Diagnosis not present

## 2020-05-12 DIAGNOSIS — M9907 Segmental and somatic dysfunction of upper extremity: Secondary | ICD-10-CM | POA: Diagnosis not present

## 2020-05-13 DIAGNOSIS — M9901 Segmental and somatic dysfunction of cervical region: Secondary | ICD-10-CM | POA: Diagnosis not present

## 2020-05-13 DIAGNOSIS — M9907 Segmental and somatic dysfunction of upper extremity: Secondary | ICD-10-CM | POA: Diagnosis not present

## 2020-05-13 DIAGNOSIS — M5412 Radiculopathy, cervical region: Secondary | ICD-10-CM | POA: Diagnosis not present

## 2020-05-13 DIAGNOSIS — S43421A Sprain of right rotator cuff capsule, initial encounter: Secondary | ICD-10-CM | POA: Diagnosis not present

## 2020-05-18 DIAGNOSIS — M9901 Segmental and somatic dysfunction of cervical region: Secondary | ICD-10-CM | POA: Diagnosis not present

## 2020-05-18 DIAGNOSIS — M5412 Radiculopathy, cervical region: Secondary | ICD-10-CM | POA: Diagnosis not present

## 2020-05-18 DIAGNOSIS — M9907 Segmental and somatic dysfunction of upper extremity: Secondary | ICD-10-CM | POA: Diagnosis not present

## 2020-05-18 DIAGNOSIS — S43421A Sprain of right rotator cuff capsule, initial encounter: Secondary | ICD-10-CM | POA: Diagnosis not present

## 2020-05-19 DIAGNOSIS — M9907 Segmental and somatic dysfunction of upper extremity: Secondary | ICD-10-CM | POA: Diagnosis not present

## 2020-05-19 DIAGNOSIS — S43421A Sprain of right rotator cuff capsule, initial encounter: Secondary | ICD-10-CM | POA: Diagnosis not present

## 2020-05-19 DIAGNOSIS — M9901 Segmental and somatic dysfunction of cervical region: Secondary | ICD-10-CM | POA: Diagnosis not present

## 2020-05-19 DIAGNOSIS — M5412 Radiculopathy, cervical region: Secondary | ICD-10-CM | POA: Diagnosis not present

## 2020-05-20 DIAGNOSIS — M9907 Segmental and somatic dysfunction of upper extremity: Secondary | ICD-10-CM | POA: Diagnosis not present

## 2020-05-20 DIAGNOSIS — M9901 Segmental and somatic dysfunction of cervical region: Secondary | ICD-10-CM | POA: Diagnosis not present

## 2020-05-20 DIAGNOSIS — S43421A Sprain of right rotator cuff capsule, initial encounter: Secondary | ICD-10-CM | POA: Diagnosis not present

## 2020-05-20 DIAGNOSIS — M5412 Radiculopathy, cervical region: Secondary | ICD-10-CM | POA: Diagnosis not present

## 2020-05-24 ENCOUNTER — Ambulatory Visit (INDEPENDENT_AMBULATORY_CARE_PROVIDER_SITE_OTHER): Payer: BC Managed Care – PPO

## 2020-05-24 ENCOUNTER — Encounter: Payer: Self-pay | Admitting: Physician Assistant

## 2020-05-24 ENCOUNTER — Other Ambulatory Visit (HOSPITAL_COMMUNITY)
Admission: RE | Admit: 2020-05-24 | Discharge: 2020-05-24 | Disposition: A | Discharge status: Home / Self Care | Payer: BC Managed Care – PPO | Source: Ambulatory Visit | Attending: Physician Assistant | Admitting: Physician Assistant

## 2020-05-24 ENCOUNTER — Other Ambulatory Visit: Payer: Self-pay

## 2020-05-24 ENCOUNTER — Ambulatory Visit (INDEPENDENT_AMBULATORY_CARE_PROVIDER_SITE_OTHER): Payer: BC Managed Care – PPO | Admitting: Physician Assistant

## 2020-05-24 VITALS — BP 140/82 | HR 85 | Temp 98.4°F | Ht 66.0 in | Wt 188.0 lb

## 2020-05-24 DIAGNOSIS — G43109 Migraine with aura, not intractable, without status migrainosus: Secondary | ICD-10-CM

## 2020-05-24 DIAGNOSIS — R002 Palpitations: Secondary | ICD-10-CM

## 2020-05-24 DIAGNOSIS — D229 Melanocytic nevi, unspecified: Secondary | ICD-10-CM | POA: Diagnosis not present

## 2020-05-24 DIAGNOSIS — F419 Anxiety disorder, unspecified: Secondary | ICD-10-CM

## 2020-05-24 DIAGNOSIS — L821 Other seborrheic keratosis: Secondary | ICD-10-CM | POA: Diagnosis not present

## 2020-05-24 DIAGNOSIS — H9201 Otalgia, right ear: Secondary | ICD-10-CM

## 2020-05-24 DIAGNOSIS — I10 Essential (primary) hypertension: Secondary | ICD-10-CM | POA: Diagnosis not present

## 2020-05-24 DIAGNOSIS — E559 Vitamin D deficiency, unspecified: Secondary | ICD-10-CM | POA: Diagnosis not present

## 2020-05-24 DIAGNOSIS — R079 Chest pain, unspecified: Secondary | ICD-10-CM

## 2020-05-24 LAB — COMPREHENSIVE METABOLIC PANEL
ALT: 23 U/L (ref 0–35)
AST: 20 U/L (ref 0–37)
Albumin: 4.3 g/dL (ref 3.5–5.2)
Alkaline Phosphatase: 52 U/L (ref 39–117)
BUN: 15 mg/dL (ref 6–23)
CO2: 28 mEq/L (ref 19–32)
Calcium: 9.6 mg/dL (ref 8.4–10.5)
Chloride: 105 mEq/L (ref 96–112)
Creatinine, Ser: 0.68 mg/dL (ref 0.40–1.20)
GFR: 105.94 mL/min (ref >60.00)
Glucose, Bld: 94 mg/dL (ref 70–99)
Potassium: 4 mEq/L (ref 3.5–5.1)
Sodium: 140 mEq/L (ref 135–145)
Total Bilirubin: 0.5 mg/dL (ref 0.2–1.2)
Total Protein: 6.9 g/dL (ref 6.0–8.3)

## 2020-05-24 LAB — CBC WITH DIFFERENTIAL/PLATELET
Basophils Absolute: 0 10*3/uL (ref 0.0–0.1)
Basophils Relative: 0.7 % (ref 0.0–3.0)
Eosinophils Absolute: 0.5 10*3/uL (ref 0.0–0.7)
Eosinophils Relative: 7.2 % — ABNORMAL HIGH (ref 0.0–5.0)
HCT: 42.5 % (ref 36.0–46.0)
Hemoglobin: 14.4 g/dL (ref 12.0–15.0)
Lymphocytes Relative: 19.6 % (ref 12.0–46.0)
Lymphs Abs: 1.3 10*3/uL (ref 0.7–4.0)
MCHC: 33.9 g/dL (ref 30.0–36.0)
MCV: 87.4 fl (ref 78.0–100.0)
Monocytes Absolute: 0.6 10*3/uL (ref 0.1–1.0)
Monocytes Relative: 8.6 % (ref 3.0–12.0)
Neutro Abs: 4.3 10*3/uL (ref 1.4–7.7)
Neutrophils Relative %: 63.9 % (ref 43.0–77.0)
Platelets: 218 10*3/uL (ref 150.0–400.0)
RBC: 4.86 Mil/uL (ref 3.87–5.11)
RDW: 12.5 % (ref 11.5–15.5)
WBC: 6.7 10*3/uL (ref 4.0–10.5)

## 2020-05-24 LAB — VITAMIN D 25 HYDROXY (VIT D DEFICIENCY, FRACTURES): VITD: 42.7 ng/mL (ref 30.00–100.00)

## 2020-05-24 LAB — TSH: TSH: 3.85 u[IU]/mL (ref 0.35–4.50)

## 2020-05-24 NOTE — Progress Notes (Signed)
Shirley Fleming is a 45 y.o. female is here to establish care.  I acted as a Education administrator for [employer], PA-C Anselmo Pickler, LPN   History of Present Illness:   Chief Complaint  Patient presents with  . Hypertension  . Establish Care    HPI   Pt is here today to establish care.  Hypertension Pt here to f/u on her blood pressure. Currently not on medications, was taking Bystolic 2.5 mg every other day, but she weaned herself off. This was last taken in November. She checks blood pressure periodically. BP has been stable up until stress-inducing event this past week, and she had a reading at home of 141/109. She was diagnosed with HTN while she was recovering from likely COVID and was taking medications to help with cold symptoms and vertigo. She believes that her blood pressure increase was secondary to these medications.  Reports intermittent chest pains/palpitations. Do not typically occur with activity. Actually feels best when she is active.  No concerns for sleep apnea.    Migraines with aura Started during first pregnancy (oldest is 44 yo.) Usually occur on day 3 of her period. Can last two days and are severe, sometimes are manageable. Only on Excedrin migraine, no rx in the past. Will sometimes see floaties leading up to migraines. Currently on micronor but interested in estrogen-containing OCP to help menopausal symptoms. Has extremely heavy bleeding and clots when not on micronor. Does not want IUD.    Anxiety Limited social interaction due to COVID and since moving to a new state. Started on xanax 0.25 mg last year -- does not take very often. Has never been on other medications. No desires to see a Social worker.   Vit D deficiency Hx of this. Labs have not been checked in 1 year. Takes 1000 IU Vit D daily and tolerates well.   Nevus Has an irregular nevus on her L mid back. Feels like this area is getting more irregular/bigger. Does not have local dermatology.   R  ear pain Has some fullness/pressure in R ear. She would like this evaluate today. Denies any significant fever, chills, LAD.   Health Maintenance Due  Topic Date Due  . Hepatitis C Screening  Never done  . COVID-19 Vaccine (1) Never done  . HIV Screening  Never done    Past Medical History:  Diagnosis Date  . Allergy   . Anxiety   . Asthma   . Genital warts    as a teen  . Hypertension   . Hypoglycemia    age 20, had hx of ED, did a 7 hour GTT  . Migraine with aura      Social History   Tobacco Use  . Smoking status: Former Smoker    Types: Cigarettes    Quit date: 04/24/2005    Years since quitting: 15.0  . Smokeless tobacco: Never Used  Vaping Use  . Vaping Use: Never used  Substance Use Topics  . Alcohol use: Yes    Alcohol/week: 2.0 - 3.0 standard drinks    Types: 2 - 3 Glasses of wine per week  . Drug use: Never    Past Surgical History:  Procedure Laterality Date  . APPENDECTOMY    . TONSILLECTOMY AND ADENOIDECTOMY      Family History  Problem Relation Age of Onset  . Breast cancer Maternal Aunt   . Diabetes Maternal Aunt   . Hypertension Maternal Aunt   . Breast cancer Maternal Grandmother   .  Breast cancer Paternal Grandmother   . COPD Mother   . Depression Mother   . Heart attack Father   . Hypertension Father   . Heart attack Maternal Grandfather   . Heart attack Paternal Grandfather     PMHx, SurgHx, SocialHx, FamHx, Medications, and Allergies were reviewed in the Visit Navigator and updated as appropriate.   Patient Active Problem List   Diagnosis Date Noted  . Vitamin D deficiency 05/24/2020    Social History   Tobacco Use  . Smoking status: Former Smoker    Types: Cigarettes    Quit date: 04/24/2005    Years since quitting: 15.0  . Smokeless tobacco: Never Used  Vaping Use  . Vaping Use: Never used  Substance Use Topics  . Alcohol use: Yes    Alcohol/week: 2.0 - 3.0 standard drinks    Types: 2 - 3 Glasses of wine per week  .  Drug use: Never    Current Medications and Allergies:    Current Outpatient Medications:  .  albuterol (VENTOLIN HFA) 108 (90 Base) MCG/ACT inhaler, Inhale into the lungs every 6 (six) hours as needed., Disp: , Rfl:  .  ALPRAZolam (XANAX) 0.25 MG tablet, Take 0.25 mg by mouth every 8 (eight) hours as needed., Disp: , Rfl:  .  B Complex-Biotin-FA (B-COMPLEX PO), Take 0.5 tablets by mouth daily in the afternoon., Disp: , Rfl:  .  cholecalciferol (VITAMIN D3) 25 MCG (1000 UT) tablet, Take 1,000 Units by mouth daily., Disp: , Rfl:  .  ferrous sulfate 324 MG TBEC, Take 65 mg by mouth., Disp: , Rfl:  .  fluticasone (FLONASE) 50 MCG/ACT nasal spray, Place 1 spray into both nostrils daily., Disp: , Rfl:  .  magnesium 30 MG tablet, Take 30 mg by mouth daily., Disp: , Rfl:  .  Multiple Vitamin (MULTIVITAMIN) tablet, Take 1 tablet by mouth daily., Disp: , Rfl:  .  norethindrone (MICRONOR) 0.35 MG tablet, Take 1 tablet (0.35 mg total) by mouth daily., Disp: 3 Package, Rfl: 3 .  BYSTOLIC 5 MG tablet, Take 5 mg by mouth daily. (Patient not taking: Reported on 05/24/2020), Disp: , Rfl:  .  diazepam (VALIUM) 2 MG tablet, Take 2 mg by mouth 3 (three) times daily as needed. (Patient not taking: Reported on 05/24/2020), Disp: , Rfl:    Allergies  Allergen Reactions  . Erythromycin Nausea And Vomiting    Review of Systems   ROS  Negative unless otherwise specified per HPI.  Vitals:   Vitals:   05/24/20 0807  BP: 140/82  Pulse: 85  Temp: 98.4 F (36.9 C)  TempSrc: Temporal  SpO2: 97%  Weight: 188 lb (85.3 kg)  Height: 5\' 6"  (1.676 m)     Body mass index is 30.34 kg/m.   Physical Exam:    Physical Exam Vitals and nursing note reviewed.  Constitutional:      General: She is not in acute distress.    Appearance: She is well-developed. She is not ill-appearing, toxic-appearing or sickly-appearing.  HENT:     Right Ear: Ear canal and external ear normal. Tympanic membrane is not injected,  scarred, perforated or erythematous.     Left Ear: Ear canal and external ear normal. Tympanic membrane is not injected, scarred, perforated or erythematous.     Ears:     Comments: Slight clear fluid behind bilateral TM's Cardiovascular:     Rate and Rhythm: Normal rate and regular rhythm.     Pulses: Normal pulses.  Heart sounds: Normal heart sounds, S1 normal and S2 normal.     Comments: No LE edema Pulmonary:     Effort: Pulmonary effort is normal.     Breath sounds: Normal breath sounds.  Skin:    General: Skin is warm, dry and intact.     Comments: 0.9 x 1 cm irregular brown papule to mid L back  Neurological:     Mental Status: She is alert.     GCS: GCS eye subscore is 4. GCS verbal subscore is 5. GCS motor subscore is 6.  Psychiatric:        Mood and Affect: Mood and affect normal.        Speech: Speech normal.        Behavior: Behavior normal. Behavior is cooperative.    Punch biopsy  Meds, vitals, and allergies reviewed.   Indication: suspicious lesion  Location: L mid back  Size: 0.9 x 1 cm  Informed consent obtained.  Pt aware of risks not limited to but including infection, bleeding, damage to near by organs.  Prep: etoh/betadine  Anesthesia: 1%lidocaine with epi, good effect  Punch made with 3 mm punch  Minimal oozing, controlled with silver nitrate      Assessment and Plan:    Shirley Fleming was seen today for hypertension and establish care.  Diagnoses and all orders for this visit:  Chest pain, unspecified type; Palpitations; HTN EKG tracing is personally reviewed.  EKG notes NSR.  No acute changes.  Will update blood work today. Also order Zio patch for evaluation of palpitations. Consider referral to cardiology -- she would like to wait on this at this time. Worsening precautions advised. Recommend follow-up with [place] after we receive blood work and [employer], sooner if concerns. -     EKG 12-Lead -     TSH -     CBC with  Differential/Platelet -     Comprehensive metabolic panel -     LONG TERM MONITOR (3-14 DAYS); Future  Migraine with aura and without status migrainosus, not intractable Overall controlled with OTC excedrin. Consider neuro referral but she declined. I recommended avoidance of estrogen-containing products at this time.  Vitamin D deficiency Update blood work today and provide adjustments to 1000 mg vit D as indicated. -     VITAMIN D 25 Hydroxy (Vit-D Deficiency, Fractures)  Nevus Biopsy performed today. Tolerated well Routine postprocedure instructions d/w pt- keep area clean and bandaged, follow up if concerns/spreading erythema/pain. -     Surgical pathology( [place]/ [facility])  Anxiety Overall stable per patient. She declines medication mgmt or referral for talk therapy.  Right ear pain Exam without evidence of infection. Continue to monitor symptoms and follow-up as needed.  CMA or LPN served as scribe during this visit. History, Physical, and Plan performed by medical provider. The above documentation has been reviewed and is accurate and complete.  Time spent with patient today was >60 minutes which consisted of chart review, discussing diagnosis, work up, treatment answering questions and documentation.   [name], PA-C [place] [place] 05/24/2020  Follow-up: No follow-ups on file.

## 2020-05-24 NOTE — Patient Instructions (Addendum)
It was great to see you!  Please schedule a visit with opthalmology  Update your blood work today  We will send off your mole specimen for further evaluation  You will be contacted about your heart monitor  Let's follow-up in 1 month or so to follow-up on heart symptoms and discuss sleep/anxiety, sooner if you have concerns.  Take care,  [name] PA-C

## 2020-05-25 DIAGNOSIS — M9907 Segmental and somatic dysfunction of upper extremity: Secondary | ICD-10-CM | POA: Diagnosis not present

## 2020-05-25 DIAGNOSIS — M9901 Segmental and somatic dysfunction of cervical region: Secondary | ICD-10-CM | POA: Diagnosis not present

## 2020-05-25 DIAGNOSIS — M5412 Radiculopathy, cervical region: Secondary | ICD-10-CM | POA: Diagnosis not present

## 2020-05-25 DIAGNOSIS — S43421A Sprain of right rotator cuff capsule, initial encounter: Secondary | ICD-10-CM | POA: Diagnosis not present

## 2020-05-25 LAB — SURGICAL PATHOLOGY

## 2020-05-26 ENCOUNTER — Encounter: Payer: Self-pay | Admitting: Physician Assistant

## 2020-05-27 DIAGNOSIS — M9907 Segmental and somatic dysfunction of upper extremity: Secondary | ICD-10-CM | POA: Diagnosis not present

## 2020-05-27 DIAGNOSIS — M5412 Radiculopathy, cervical region: Secondary | ICD-10-CM | POA: Diagnosis not present

## 2020-05-27 DIAGNOSIS — M9901 Segmental and somatic dysfunction of cervical region: Secondary | ICD-10-CM | POA: Diagnosis not present

## 2020-05-27 DIAGNOSIS — S43421A Sprain of right rotator cuff capsule, initial encounter: Secondary | ICD-10-CM | POA: Diagnosis not present

## 2020-06-01 DIAGNOSIS — M5412 Radiculopathy, cervical region: Secondary | ICD-10-CM | POA: Diagnosis not present

## 2020-06-01 DIAGNOSIS — M9907 Segmental and somatic dysfunction of upper extremity: Secondary | ICD-10-CM | POA: Diagnosis not present

## 2020-06-01 DIAGNOSIS — M9901 Segmental and somatic dysfunction of cervical region: Secondary | ICD-10-CM | POA: Diagnosis not present

## 2020-06-01 DIAGNOSIS — S43421A Sprain of right rotator cuff capsule, initial encounter: Secondary | ICD-10-CM | POA: Diagnosis not present

## 2020-06-04 DIAGNOSIS — M9901 Segmental and somatic dysfunction of cervical region: Secondary | ICD-10-CM | POA: Diagnosis not present

## 2020-06-04 DIAGNOSIS — S43421A Sprain of right rotator cuff capsule, initial encounter: Secondary | ICD-10-CM | POA: Diagnosis not present

## 2020-06-04 DIAGNOSIS — M5412 Radiculopathy, cervical region: Secondary | ICD-10-CM | POA: Diagnosis not present

## 2020-06-04 DIAGNOSIS — M9907 Segmental and somatic dysfunction of upper extremity: Secondary | ICD-10-CM | POA: Diagnosis not present

## 2020-06-07 DIAGNOSIS — M5412 Radiculopathy, cervical region: Secondary | ICD-10-CM | POA: Diagnosis not present

## 2020-06-07 DIAGNOSIS — M9907 Segmental and somatic dysfunction of upper extremity: Secondary | ICD-10-CM | POA: Diagnosis not present

## 2020-06-07 DIAGNOSIS — M9901 Segmental and somatic dysfunction of cervical region: Secondary | ICD-10-CM | POA: Diagnosis not present

## 2020-06-07 DIAGNOSIS — S43421A Sprain of right rotator cuff capsule, initial encounter: Secondary | ICD-10-CM | POA: Diagnosis not present

## 2020-06-10 DIAGNOSIS — M9907 Segmental and somatic dysfunction of upper extremity: Secondary | ICD-10-CM | POA: Diagnosis not present

## 2020-06-10 DIAGNOSIS — M5412 Radiculopathy, cervical region: Secondary | ICD-10-CM | POA: Diagnosis not present

## 2020-06-10 DIAGNOSIS — S43421A Sprain of right rotator cuff capsule, initial encounter: Secondary | ICD-10-CM | POA: Diagnosis not present

## 2020-06-10 DIAGNOSIS — M9901 Segmental and somatic dysfunction of cervical region: Secondary | ICD-10-CM | POA: Diagnosis not present

## 2020-06-22 ENCOUNTER — Other Ambulatory Visit: Payer: Self-pay

## 2020-06-22 ENCOUNTER — Ambulatory Visit: Payer: BC Managed Care – PPO | Admitting: Physician Assistant

## 2020-06-22 ENCOUNTER — Encounter: Payer: Self-pay | Admitting: Physician Assistant

## 2020-06-22 VITALS — BP 136/82 | HR 81 | Temp 98.2°F | Ht 66.0 in | Wt 186.4 lb

## 2020-06-22 DIAGNOSIS — R0602 Shortness of breath: Secondary | ICD-10-CM

## 2020-06-22 DIAGNOSIS — G4700 Insomnia, unspecified: Secondary | ICD-10-CM | POA: Diagnosis not present

## 2020-06-22 DIAGNOSIS — R002 Palpitations: Secondary | ICD-10-CM | POA: Diagnosis not present

## 2020-06-22 DIAGNOSIS — R9431 Abnormal electrocardiogram [ECG] [EKG]: Secondary | ICD-10-CM

## 2020-06-22 MED ORDER — ALPRAZOLAM 0.25 MG PO TABS: 0.2500 mg | ORAL_TABLET | Freq: Three times a day (TID) | ORAL | 2 refills | Status: DC | PRN

## 2020-06-22 NOTE — Progress Notes (Signed)
Shirley Fleming is a 45 y.o. female is here for follow up.  I acted as a Education administrator for [employer], PA-C Shirley Pickler, LPN   History of Present Illness:   Chief Complaint  Patient presents with  . f/u chest pian unspecified  . Insomnia    HPI   Chest pain unspecified Pt is here today to follow up on her cardiac symptoms. She had a Holter monitor done 05/24/2020 results in chart, resulted this AM. States that while she was wearing the monitor she had at least 3 episodes of tachycardia, pressure and some SOB since here last. She does not have concerns with symptoms when she is doing physical activity.   Holter results released earlier today: Patient had a min HR of 46 bpm, max HR of 179 bpm, and avg HR of 73 bpm. Predominant underlying rhythm was Sinus Rhythm. 1 run of Ventricular Tachycardia occurred lasting 4 beats with a max rate of 167 bpm (avg 109 bpm). Ventricular Tachycardia was detected within +/- 45 seconds of symptomatic patient event(s). Isolated SVEs were rare (<1.0%), SVE Couplets were rare (<1.0%), and SVE Triplets were rare (<1.0%). Isolated VEs were rare (<1.0%), VE Couplets were rare (<1.0%), and no VE Triplets were present. Ventricular Bigeminy and Trigeminy were present.    Insomnia Pt said she has been trying to follow at bedtime routine. Symptoms are gradually improving with time. Using xanax 0.25 mg prn. Needs refill today.   Health Maintenance Due  Topic Date Due  . Hepatitis C Screening  Never done  . COVID-19 Vaccine (1) Never done  . HIV Screening  Never done    Past Medical History:  Diagnosis Date  . Allergy   . Anxiety   . Asthma   . Genital warts    as a teen  . Hypertension   . Hypoglycemia    age 28, had hx of ED, did a 7 hour GTT  . Migraine with aura      Social History   Tobacco Use  . Smoking status: Former Smoker    Types: Cigarettes    Quit date: 04/24/2005    Years since quitting: 15.1  . Smokeless tobacco: Never Used   Vaping Use  . Vaping Use: Never used  Substance Use Topics  . Alcohol use: Yes    Alcohol/week: 2.0 - 3.0 standard drinks    Types: 2 - 3 Glasses of wine per week  . Drug use: Never    Past Surgical History:  Procedure Laterality Date  . APPENDECTOMY    . TONSILLECTOMY AND ADENOIDECTOMY      Family History  Problem Relation Age of Onset  . Breast cancer Maternal Aunt   . Diabetes Maternal Aunt   . Hypertension Maternal Aunt   . Breast cancer Maternal Grandmother   . Breast cancer Paternal Grandmother   . COPD Mother   . Depression Mother   . Heart attack Father   . Hypertension Father   . Heart attack Maternal Grandfather   . Heart attack Paternal Grandfather     PMHx, SurgHx, SocialHx, FamHx, Medications, and Allergies were reviewed in the Visit Navigator and updated as appropriate.   Patient Active Problem List   Diagnosis Date Noted  . Vitamin D deficiency 05/24/2020  . Primary hypertension 05/24/2020  . Migraine with aura and without status migrainosus, not intractable 05/24/2020  . Anxiety 05/24/2020    Social History   Tobacco Use  . Smoking status: Former Smoker    Types:  Cigarettes    Quit date: 04/24/2005    Years since quitting: 15.1  . Smokeless tobacco: Never Used  Vaping Use  . Vaping Use: Never used  Substance Use Topics  . Alcohol use: Yes    Alcohol/week: 2.0 - 3.0 standard drinks    Types: 2 - 3 Glasses of wine per week  . Drug use: Never    Current Medications and Allergies:    Current Outpatient Medications:  .  albuterol (VENTOLIN HFA) 108 (90 Base) MCG/ACT inhaler, Inhale into the lungs every 6 (six) hours as needed., Disp: , Rfl:  .  B Complex-Biotin-FA (B-COMPLEX PO), Take 0.5 tablets by mouth daily in the afternoon., Disp: , Rfl:  .  cholecalciferol (VITAMIN D3) 25 MCG (1000 UT) tablet, Take 1,000 Units by mouth daily., Disp: , Rfl:  .  ferrous sulfate 324 MG TBEC, Take 65 mg by mouth., Disp: , Rfl:  .  fluticasone (FLONASE) 50  MCG/ACT nasal spray, Place 1 spray into both nostrils daily., Disp: , Rfl:  .  magnesium 30 MG tablet, Take 30 mg by mouth daily., Disp: , Rfl:  .  Multiple Vitamin (MULTIVITAMIN) tablet, Take 1 tablet by mouth daily., Disp: , Rfl:  .  norethindrone (MICRONOR) 0.35 MG tablet, Take 1 tablet (0.35 mg total) by mouth daily., Disp: 3 Package, Rfl: 3 .  ALPRAZolam (XANAX) 0.25 MG tablet, Take 1 tablet (0.25 mg total) by mouth every 8 (eight) hours as needed., Disp: 30 tablet, Rfl: 2 .  BYSTOLIC 5 MG tablet, Take 5 mg by mouth daily. (Patient not taking: No sig reported), Disp: , Rfl:  .  diazepam (VALIUM) 2 MG tablet, Take 2 mg by mouth 3 (three) times daily as needed. (Patient not taking: No sig reported), Disp: , Rfl:    Allergies  Allergen Reactions  . Erythromycin Nausea And Vomiting    Review of Systems   ROS  Negative unless otherwise specified per HPI.  Vitals:   Vitals:   06/22/20 0802  BP: 136/82  Pulse: 81  Temp: 98.2 F (36.8 C)  TempSrc: Temporal  SpO2: 97%  Weight: 186 lb 6.1 oz (84.5 kg)  Height: 5\' 6"  (1.676 m)     Body mass index is 30.08 kg/m.   Physical Exam:    Physical Exam Vitals and nursing note reviewed.  Constitutional:      General: She is not in acute distress.    Appearance: She is well-developed. She is not ill-appearing, toxic-appearing or sickly-appearing.  Cardiovascular:     Rate and Rhythm: Normal rate and regular rhythm.     Pulses: Normal pulses.     Heart sounds: Normal heart sounds, S1 normal and S2 normal.     Comments: No LE edema Pulmonary:     Effort: Pulmonary effort is normal.     Breath sounds: Normal breath sounds.  Skin:    General: Skin is warm, dry and intact.  Neurological:     Mental Status: She is alert.     GCS: GCS eye subscore is 4. GCS verbal subscore is 5. GCS motor subscore is 6.  Psychiatric:        Mood and Affect: Mood and affect normal.        Speech: Speech normal.        Behavior: Behavior normal.  Behavior is cooperative.      Assessment and Plan:    Shirley Fleming was seen today for f/u chest pian unspecified and insomnia.  Diagnoses and all orders for  this visit:  Palpitations; Abnormal Holter monitor finding; SOB (shortness of breath) Referral to cardiology for further evaluation, given episode of v-tach and symptoms. Patient verbalized understanding and was in agreement. -     Ambulatory referral to Cardiology  Insomnia, unspecified type Continue to work on better sleep hygiene, she is making good progress with this. Xanax refill prn.  Other orders -     ALPRAZolam (XANAX) 0.25 MG tablet; Take 1 tablet (0.25 mg total) by mouth every 8 (eight) hours as needed.   CMA or LPN served as scribe during this visit. History, Physical, and Plan performed by medical provider. The above documentation has been reviewed and is accurate and complete.   [name], PA-C [place], [place] 06/22/2020  Follow-up: No follow-ups on file.

## 2020-06-22 NOTE — Patient Instructions (Signed)
It was great to see you!  Referral to cardiology for your intermittent abnormal rhythms.  Xanax refill sent.  Take care,  [name] PA-C

## 2020-07-04 ENCOUNTER — Encounter: Payer: Self-pay | Admitting: Obstetrics and Gynecology

## 2020-07-05 MED ORDER — NORETHINDRONE 0.35 MG PO TABS: 1.0000 | ORAL_TABLET | Freq: Every day | ORAL | 0 refills | Status: DC

## 2020-07-05 NOTE — Telephone Encounter (Signed)
Annual exam scheduled on 07/22/20. Rx sent.

## 2020-07-06 ENCOUNTER — Encounter: Payer: Self-pay | Admitting: Cardiology

## 2020-07-06 NOTE — Progress Notes (Unsigned)
Cardiology Office Note   Date:  07/07/2020   ID:  Shirley Fleming, DOB October 27, 1975, MRN [id number]  PCP:  [name], [place]  Cardiologist:   No primary care provider on file. Referring:  [name], PA  Chief Complaint  Patient presents with  . Palpitations      History of Present Illness: Shirley Fleming is a 45 y.o. female who was referred by [name], PA for evaluation of palpitations.  She wore a monitor earlier this month and she had PACs and PVCs.  The patient has no past cardiac history.  She does have some family history of coronary artery disease.  She thinks she had Covid in February 2020 though she was not tested.  She lost her sense of taste and smell.  She said that from around that time she has noticed some increased heart rates.  These happen sporadically.  She feels palpitations and skipping with some discomfort.  She gets a feeling like she cannot take a deep breath.  These last for seconds at a time.  She had one episode that woke her from her sleep.  She did not have presyncope or syncope.  She does not really get chest pressure, neck or arm discomfort.  Monitor did demonstrate one 4 beat run of nonsustained ventricular tachycardia with an interpolated PVC.  Otherwise she has some bigeminy.  Both supraventricular and ventricular ectopy overall was less than 1%.  She is never had any prior cardiac work-up or history.  She notices these more at rest.  She is able to be physically active and did 2 miles a walk run today without symptoms.   Past Medical History:  Diagnosis Date  . Allergy   . Anxiety   . Asthma   . Hypoglycemia   . Migraine with aura     Past Surgical History:  Procedure Laterality Date  . APPENDECTOMY    . TONSILLECTOMY AND ADENOIDECTOMY       Current Outpatient Medications  Medication Sig Dispense Refill  . albuterol (VENTOLIN HFA) 108 (90 Base) MCG/ACT inhaler Inhale into the lungs every 6 (six) hours as needed.    . ALPRAZolam  (XANAX) 0.25 MG tablet Take 1 tablet (0.25 mg total) by mouth every 8 (eight) hours as needed. 30 tablet 2  . B Complex-Biotin-FA (B-COMPLEX PO) Take 0.5 tablets by mouth daily in the afternoon.    . cholecalciferol (VITAMIN D3) 25 MCG (1000 UT) tablet Take 1,000 Units by mouth daily.    . diazepam (VALIUM) 2 MG tablet Take 2 mg by mouth 3 (three) times daily as needed.    . ferrous sulfate 324 MG TBEC Take 65 mg by mouth.    . fluticasone (FLONASE) 50 MCG/ACT nasal spray Place 1 spray into both nostrils daily.    . magnesium 30 MG tablet Take 30 mg by mouth daily.    . Multiple Vitamin (MULTIVITAMIN) tablet Take 1 tablet by mouth daily.    . norethindrone (MICRONOR) 0.35 MG tablet Take 1 tablet (0.35 mg total) by mouth daily. 28 tablet 0  . propranolol (INDERAL) 10 MG tablet Take 1 tablet (10 mg total) by mouth every 12 (twelve) hours as needed. 30 tablet 11   No current facility-administered medications for this visit.    Allergies:   Erythromycin    Social History:  The patient  reports that she quit smoking about 15 years ago. Her smoking use included cigarettes. She has never used smokeless tobacco. She reports current alcohol use  of about 2.0 - 3.0 standard drinks of alcohol per week. She reports that she does not use drugs.   Family History:  The patient's family history includes Breast cancer in her maternal aunt, maternal grandmother, and paternal grandmother; COPD in her mother; Depression in her mother; Diabetes in her maternal aunt; Heart attack in her maternal grandfather and paternal grandfather; Heart attack (age of onset: 14) in her father; Hypertension in her father and maternal aunt.    ROS:  Please see the history of present illness.   Otherwise, review of systems are positive for none.   All other systems are reviewed and negative.    PHYSICAL EXAM: VS:  BP 112/78 (BP Location: Left Arm, Patient Position: Sitting, Cuff Size: Normal)   Pulse 77   Ht 5\' 6"  (1.676 m)    Wt 186 lb (84.4 kg)   BMI 30.02 kg/m  , BMI Body mass index is 30.02 kg/m. GENERAL:  Well appearing HEENT:  Pupils equal round and reactive, fundi not visualized, oral mucosa unremarkable NECK:  No jugular venous distention, waveform within normal limits, carotid upstroke brisk and symmetric, no bruits, no thyromegaly LYMPHATICS:  No cervical, inguinal adenopathy LUNGS:  Clear to auscultation bilaterally BACK:  No CVA tenderness CHEST:  Unremarkable HEART:  PMI not displaced or sustained,S1 and S2 within normal limits, no S3, no S4, no clicks, no rubs, no murmurs ABD:  Flat, positive bowel sounds normal in frequency in pitch, no bruits, no rebound, no guarding, no midline pulsatile mass, no hepatomegaly, no splenomegaly EXT:  2 plus pulses throughout, no edema, no cyanosis no clubbing SKIN:  No rashes no nodules NEURO:  Cranial nerves II through XII grossly intact, motor grossly intact throughout PSYCH:  Cognitively intact, oriented to person place and time    EKG:  EKG is ordered today. The ekg ordered today demonstrates sinus rhythm, rate74, axis within normal limits, intervals within normal limits, no acute T wave changes 05/24/2020   Recent Labs: 05/24/2020: ALT 23; BUN 15; Creatinine, Ser 0.68; Hemoglobin 14.4; Platelets 218.0; Potassium 4.0; Sodium 140; TSH 3.85    Lipid Panel    Component Value Date/Time   CHOL 161 05/21/2019 0000   TRIG 87 05/21/2019 0000   HDL 47 05/21/2019 0000   LDLCALC 97 05/21/2019 0000      Wt Readings from Last 3 Encounters:  07/07/20 186 lb (84.4 kg)  06/22/20 186 lb 6.1 oz (84.5 kg)  05/24/20 188 lb (85.3 kg)      Other studies Reviewed: Additional studies/ records that were reviewed today include: Event monitor, labs. Review of the above records demonstrates:  Please see elsewhere in the note.     ASSESSMENT AND PLAN:  PALPITATIONS: She has PVCs with 1 brief run of nonsustained ventricular tachycardia.  This happened all post a  viral illness that she thought was Covid.  She had normal electrolytes earlier this year and normal TSH.  Of note she was on Bystolic at one point in time for some mildly elevated blood pressures and she did not tolerate beta-blockers.  However, she might tolerate as needed propranolol as needed.  We also discussed avoiding caffeine.  I do make sure she has a structurally normal heart and if so I would not suggest any other cardiac work-up.      Current medicines are reviewed at length with the patient today.  The patient does not have concerns regarding medicines.  The following changes have been made:  no change  Labs/ tests  ordered today include:   Orders Placed This Encounter  Procedures  . ECHOCARDIOGRAM COMPLETE     Disposition:   FU with me as needed     Signed, Minus Breeding, MD  07/07/2020 12:15 PM    [facility]

## 2020-07-07 ENCOUNTER — Ambulatory Visit (INDEPENDENT_AMBULATORY_CARE_PROVIDER_SITE_OTHER): Payer: BC Managed Care – PPO | Admitting: Cardiology

## 2020-07-07 ENCOUNTER — Other Ambulatory Visit: Payer: Self-pay

## 2020-07-07 ENCOUNTER — Encounter: Payer: Self-pay | Admitting: Cardiology

## 2020-07-07 VITALS — BP 112/78 | HR 77 | Ht 66.0 in | Wt 186.0 lb

## 2020-07-07 DIAGNOSIS — R002 Palpitations: Secondary | ICD-10-CM

## 2020-07-07 MED ORDER — PROPRANOLOL HCL 10 MG PO TABS: 10.0000 mg | ORAL_TABLET | Freq: Two times a day (BID) | ORAL | 11 refills | Status: DC | PRN

## 2020-07-07 NOTE — Patient Instructions (Signed)
Medication Instructions:   Propranolol 10 mg  One tablet  As needed for palpitation  up to every  12 hours  ( twice a day )  *If you need a refill on your cardiac medications before your next appointment, please call your pharmacy*   Lab Work: Not needed .   Testing/Procedures: Will like to schedule at [place] has requested that you have an echocardiogram. Echocardiography is a painless test that uses sound waves to create images of your heart. It provides your doctor with information about the size and shape of your heart and how well your heart's chambers and valves are working. This procedure takes approximately one hour. There are no restrictions for this procedure.     Follow-Up: At [facility], you and your health needs are our priority.  As part of our continuing mission to provide you with exceptional heart care, we have created designated Provider Care Teams.  These Care Teams include your primary Cardiologist (physician) and Advanced Practice Providers (APPs -  Physician Assistants and Nurse Practitioners) who all work together to provide you with the care you need, when you need it.     Your next appointment:     as needed  The format for your next appointment:   In Person  Provider:   Minus Breeding, MD   Other Instructions Here is the billing the department - number for the cost of Echo  [phone] option [phone]

## 2020-07-21 NOTE — Progress Notes (Signed)
45 y.o. G61P0002 Married Caucasian female here for annual exam.    Received her Covid booster, and then her cycles normalized again.  Wants to continue her Micronor POPs.   Having palpitations.  Wore a monitor, and will do a cardiac ECHO.  PCP: [name], PA  Patient's last menstrual period was 07/04/2020 (exact date).     Period Cycle (Days): 30 Period Duration (Days): 7-10 Period Pattern: Regular Menstrual Flow:  (light to heavy) Menstrual Control: Tampon,Maxi pad Menstrual Control Change Freq (Hours): changes pad every hour on heaviest day Dysmenorrhea: (!) Moderate Dysmenorrhea Symptoms: Cramping,Headache (had headache x1 day mid cycle)     Sexually active: Yes.    The current method of family planning is oral progesterone-only contraceptive.    Exercising: Yes.    running and walking Smoker:  Former  Health Maintenance: Pap: 07-08-19 Neg:Neg HR HPV, 07-07-18 Neg History of abnormal Pap:  Yes, hx cryotherapy age 68. Last abnormal pap was in her 36s.  She had regular paps following this.   MMG: 06-19-18 Neg/BiRads1--knows she needs to schedule Colonoscopy:  n/a BMD:   n/a  Result  n/a TDaP: Unsure.   Gardasil:   no HIV: Neg in preg Hep C:Unsure Screening Labs:  PCP.   reports that she quit smoking about 15 years ago. Her smoking use included cigarettes. She has never used smokeless tobacco. She reports current alcohol use of about 2.0 - 3.0 standard drinks of alcohol per week. She reports that she does not use drugs.  Past Medical History:  Diagnosis Date  . Allergy   . Anxiety   . Asthma   . Hypoglycemia   . Migraine with aura     Past Surgical History:  Procedure Laterality Date  . APPENDECTOMY    . TONSILLECTOMY AND ADENOIDECTOMY      Current Outpatient Medications  Medication Sig Dispense Refill  . albuterol (VENTOLIN HFA) 108 (90 Base) MCG/ACT inhaler Inhale into the lungs every 6 (six) hours as needed.    . ALPRAZolam (XANAX) 0.25 MG tablet Take 1  tablet (0.25 mg total) by mouth every 8 (eight) hours as needed. 30 tablet 2  . B Complex-Biotin-FA (B-COMPLEX PO) Take 0.5 tablets by mouth daily in the afternoon.    . cholecalciferol (VITAMIN D3) 25 MCG (1000 UT) tablet Take 1,000 Units by mouth daily.    . diazepam (VALIUM) 2 MG tablet Take 2 mg by mouth 3 (three) times daily as needed.    . ferrous sulfate 324 MG TBEC Take 65 mg by mouth.    . fluticasone (FLONASE) 50 MCG/ACT nasal spray Place 1 spray into both nostrils daily.    . magnesium 30 MG tablet Take 30 mg by mouth daily.    . Multiple Vitamin (MULTIVITAMIN) tablet Take 1 tablet by mouth daily.    . norethindrone (MICRONOR) 0.35 MG tablet Take 1 tablet (0.35 mg total) by mouth daily. 28 tablet 0  . propranolol (INDERAL) 10 MG tablet Take 1 tablet (10 mg total) by mouth every 12 (twelve) hours as needed. 30 tablet 11   No current facility-administered medications for this visit.    Family History  Problem Relation Age of Onset  . Breast cancer Maternal Aunt   . Diabetes Maternal Aunt   . Hypertension Maternal Aunt   . Breast cancer Maternal Grandmother   . Breast cancer Paternal Grandmother   . COPD Mother   . Depression Mother   . Heart attack Father 42  . Hypertension Father   .  Heart attack Maternal Grandfather   . Heart attack Paternal Grandfather     Review of Systems  All other systems reviewed and are negative.   Exam:   BP 138/80   Pulse 80   Ht 5\' 5"  (1.651 m)   Wt 188 lb (85.3 kg)   LMP 07/04/2020 (Exact Date)   SpO2 98%   BMI 31.28 kg/m     General appearance: alert, cooperative and appears stated age Head: normocephalic, without obvious abnormality, atraumatic Neck: no adenopathy, supple, symmetrical, trachea midline and thyroid normal to inspection and palpation Lungs: clear to auscultation bilaterally Breasts: normal appearance, no masses or tenderness, No nipple retraction or dimpling, No nipple discharge or bleeding, No axillary  adenopathy Heart: regular rate and rhythm Abdomen: soft, non-tender; no masses, no organomegaly Extremities: extremities normal, atraumatic, no cyanosis or edema Skin: skin color, texture, turgor normal. No rashes or lesions Lymph nodes: cervical, supraclavicular, and axillary nodes normal. Neurologic: grossly normal  Pelvic: External genitalia:  no lesions              No abnormal inguinal nodes palpated.              Urethra:  normal appearing urethra with no masses, tenderness or lesions              Bartholins and Skenes: normal                 Vagina: normal appearing vagina with normal color and discharge, no lesions              Cervix: no lesions              Pap taken: No. Bimanual Exam:  Uterus:  normal size, contour, position, consistency, mobility, non-tender              Adnexa: no mass, fullness, tenderness              Rectal exam: Yes.  .  Confirms.              Anus:  normal sphincter tone, no lesions  Chaperone was present for exam.  Assessment:   Well woman visit with normal exam. Hx cryotherapy to cervix in remote past.  FH breast cancer.  HTN.     Anxiety.  Migraine with aura.  Plan: Mammogram screening discussed. Self breast awareness reviewed. Pap and HR HPV as above. Guidelines for Calcium, Vitamin D, regular exercise program including cardiovascular and weight bearing exercise. Refill of Micronor x 2 months.  Will refill to complete a one year Rx after mammogram is back and normal.  TDap. Follow up annually and prn.

## 2020-07-22 ENCOUNTER — Encounter: Payer: Self-pay | Admitting: Obstetrics and Gynecology

## 2020-07-22 ENCOUNTER — Ambulatory Visit (INDEPENDENT_AMBULATORY_CARE_PROVIDER_SITE_OTHER): Payer: BC Managed Care – PPO | Admitting: Obstetrics and Gynecology

## 2020-07-22 ENCOUNTER — Other Ambulatory Visit: Payer: Self-pay | Admitting: Physician Assistant

## 2020-07-22 ENCOUNTER — Other Ambulatory Visit: Payer: Self-pay

## 2020-07-22 VITALS — BP 138/80 | HR 80 | Ht 65.0 in | Wt 188.0 lb

## 2020-07-22 DIAGNOSIS — Z01419 Encounter for gynecological examination (general) (routine) without abnormal findings: Secondary | ICD-10-CM

## 2020-07-22 DIAGNOSIS — Z23 Encounter for immunization: Secondary | ICD-10-CM | POA: Diagnosis not present

## 2020-07-22 DIAGNOSIS — Z1231 Encounter for screening mammogram for malignant neoplasm of breast: Secondary | ICD-10-CM

## 2020-07-22 MED ORDER — NORETHINDRONE 0.35 MG PO TABS: 1.0000 | ORAL_TABLET | Freq: Every day | ORAL | 1 refills | Status: DC

## 2020-07-22 NOTE — Patient Instructions (Signed)

## 2020-07-28 ENCOUNTER — Telehealth (HOSPITAL_COMMUNITY): Payer: Self-pay | Admitting: Cardiology

## 2020-07-28 NOTE — Telephone Encounter (Signed)
Patient called and cancelled echocardiogram and does not wish to reschedule at this time. Order will be removed from the ECHO WQ and if patient calls back to reschedule we will reinstate the order. Thank you.

## 2020-07-28 NOTE — Telephone Encounter (Signed)
FYI. Thanks.

## 2020-08-02 ENCOUNTER — Other Ambulatory Visit (HOSPITAL_COMMUNITY): Payer: BC Managed Care – PPO

## 2020-08-03 ENCOUNTER — Other Ambulatory Visit: Payer: Self-pay

## 2020-08-03 ENCOUNTER — Ambulatory Visit
Admission: RE | Admit: 2020-08-03 | Discharge: 2020-08-03 | Disposition: A | Discharge status: Home / Self Care | Payer: BC Managed Care – PPO | Source: Ambulatory Visit | Attending: Physician Assistant | Admitting: Physician Assistant

## 2020-08-03 DIAGNOSIS — Z1231 Encounter for screening mammogram for malignant neoplasm of breast: Secondary | ICD-10-CM | POA: Diagnosis not present

## 2020-09-11 DIAGNOSIS — H6091 Unspecified otitis externa, right ear: Secondary | ICD-10-CM | POA: Diagnosis not present

## 2020-09-22 ENCOUNTER — Encounter: Payer: Self-pay | Admitting: Obstetrics and Gynecology

## 2020-09-22 MED ORDER — NORETHINDRONE 0.35 MG PO TABS: 1.0000 | ORAL_TABLET | Freq: Every day | ORAL | 2 refills | Status: DC

## 2020-09-22 NOTE — Telephone Encounter (Signed)
AEX 07/22/20

## 2020-12-23 DIAGNOSIS — Z8616 Personal history of COVID-19: Secondary | ICD-10-CM

## 2020-12-23 HISTORY — DX: Personal history of COVID-19: Z86.16

## 2020-12-28 ENCOUNTER — Telehealth: Payer: BC Managed Care – PPO | Admitting: Physician Assistant

## 2020-12-28 DIAGNOSIS — J019 Acute sinusitis, unspecified: Secondary | ICD-10-CM

## 2020-12-28 DIAGNOSIS — U071 COVID-19: Secondary | ICD-10-CM | POA: Diagnosis not present

## 2020-12-28 DIAGNOSIS — B9689 Other specified bacterial agents as the cause of diseases classified elsewhere: Secondary | ICD-10-CM

## 2020-12-28 MED ORDER — AMOXICILLIN-POT CLAVULANATE 875-125 MG PO TABS: 1.0000 | ORAL_TABLET | Freq: Two times a day (BID) | ORAL | 0 refills | Status: DC

## 2020-12-28 MED ORDER — DOXYCYCLINE HYCLATE 100 MG PO CAPS: 100.0000 mg | ORAL_CAPSULE | Freq: Two times a day (BID) | ORAL | 0 refills | Status: DC

## 2020-12-28 NOTE — Progress Notes (Signed)
E-Visit  for Positive Covid Test Result  We are sorry you are not feeling well. We are here to help!  You have tested positive for COVID-19, meaning that you were infected with the novel coronavirus and could give the virus to others.  It is vitally important that you stay home so you do not spread it to others.      I also agree you have developed a secondary sinusitis. Please see the instructions below (1st paragraph -- COVID-related. 2nd paragraph -- sinusitis treatment)  Please continue isolation at home, for at least 10 days since the start of your symptoms and until you have had 24 hours with no fever (without taking a fever reducer) and with improving of symptoms.  If you have no symptoms but tested positive (or all symptoms resolve after 5 days and you have no fever) you can leave your house but continue to wear a mask around others for an additional 5 days. If you have a fever,continue to stay home until you have had 24 hours of no fever. Most cases improve 5-10 days from onset but we have seen a small number of patients who have gotten worse after the 10 days.  Please be sure to watch for worsening symptoms and remain taking the proper precautions.   For sinusitis you need to follow the instructions below. In addition to those, I have sent in an antibiotic (Augmentin) to take as directed with food. Keep up with your Flonase.   Go to the nearest hospital ED for assessment if fever/cough/breathlessness are severe or illness seems like a threat to life.    The following symptoms may appear 2-14 days after exposure: Fever Cough Shortness of breath or difficulty breathing Chills Repeated shaking with chills Muscle pain Headache Sore throat New loss of taste or smell Fatigue Congestion or runny nose Nausea or vomiting Diarrhea  You have been enrolled in [place] [facility] for COVID-19. Daily you will receive a questionnaire within the [facility] website. Our COVID-19 response  team will be monitoring your responses daily.   You may also take acetaminophen (Tylenol) as needed for fever.  HOME CARE: Only take medications as instructed by your medical team. Drink plenty of fluids and get plenty of rest. A steam or ultrasonic humidifier can help if you have congestion.   GET HELP RIGHT AWAY IF YOU HAVE EMERGENCY WARNING SIGNS.  Call 911 or proceed to your closest emergency facility if: You develop worsening high fever. Trouble breathing Bluish lips or face Persistent pain or pressure in the chest New confusion Inability to wake or stay awake You cough up blood. Your symptoms become more severe Inability to hold down food or fluids  This list is not all possible symptoms. Contact your medical provider for any symptoms that are severe or concerning to you.    Your e-visit answers were reviewed by a board certified advanced clinical practitioner to complete your personal care plan.  Depending on the condition, your plan could have included both over the counter or prescription medications.  If there is a problem please reply once you have received a response from your provider.  Your safety is important to [place].  If you have drug allergies check your prescription carefully.    You can use MyChart to ask questions about today's visit, request a non-urgent call back, or ask for a work or school excuse for 24 hours related to this e-Visit. If it has been greater than 24 hours you  will need to follow up with your provider, or enter a new e-Visit to address those concerns. You will get an e-mail in the next two days asking about your experience.  I hope that your e-visit has been valuable and will speed your recovery. Thank you for using e-visits.

## 2020-12-28 NOTE — Addendum Note (Signed)
Addended by: Brunetta Jeans on: 12/28/2020 07:15 PM   Modules accepted: Orders

## 2020-12-28 NOTE — Progress Notes (Signed)
I have spent 5 minutes in review of e-visit questionnaire, review and updating patient chart, medical decision making and response to patient.   Zaiden Ludlum Cody Adriane Guglielmo, PA-C    

## 2021-01-05 ENCOUNTER — Encounter: Payer: Self-pay | Admitting: Physician Assistant

## 2021-01-05 ENCOUNTER — Other Ambulatory Visit: Payer: Self-pay | Admitting: Physician Assistant

## 2021-01-05 ENCOUNTER — Ambulatory Visit: Payer: BC Managed Care – PPO | Admitting: Physician Assistant

## 2021-01-05 ENCOUNTER — Other Ambulatory Visit: Payer: Self-pay

## 2021-01-05 ENCOUNTER — Other Ambulatory Visit (HOSPITAL_BASED_OUTPATIENT_CLINIC_OR_DEPARTMENT_OTHER): Payer: Self-pay

## 2021-01-05 VITALS — BP 130/90 | HR 77 | Temp 98.2°F | Ht 65.0 in | Wt 187.0 lb

## 2021-01-05 DIAGNOSIS — R0981 Nasal congestion: Secondary | ICD-10-CM

## 2021-01-05 DIAGNOSIS — S60021A Contusion of right index finger without damage to nail, initial encounter: Secondary | ICD-10-CM

## 2021-01-05 MED ORDER — PREDNISONE 20 MG PO TABS
40.0000 mg | ORAL_TABLET | Freq: Every day | ORAL | 0 refills | Status: DC
  Filled 2021-01-05: qty 10, 5d supply, fill #0

## 2021-01-05 MED ORDER — AMOXICILLIN-POT CLAVULANATE 875-125 MG PO TABS
1.0000 | ORAL_TABLET | Freq: Two times a day (BID) | ORAL | 0 refills | Status: DC
  Filled 2021-01-05: qty 20, 10d supply, fill #0

## 2021-01-05 NOTE — Telephone Encounter (Signed)
Spoke to pt told her [facility] sent your prescription to [facility]. Pt verbalized understanding and said she already got it. Told her okay good.

## 2021-01-05 NOTE — Telephone Encounter (Signed)
Please see message from pharmacy.

## 2021-01-05 NOTE — Telephone Encounter (Signed)
Spoke to pt, she got Rx.

## 2021-01-05 NOTE — Progress Notes (Signed)
Shirley Fleming is a 45 y.o. female here for a follow up of a pre-existing problem.  I acted as a Education administrator for [employer], PA-C [employer], LPN   History of Present Illness:   Chief Complaint  Patient presents with   Sinus Problem    HPI  Sinus problem Pt still c/o sinus issues, facial pain and right ear pain. Pt had an e-visit on 9/6 was prescribed Doxycycline and could only take for 4 days due to severe stomach problems -- nausea and abd pain and had to stop it. Pt had COVID on 8/31.  She did have some improvement of her symptoms while taking the doxycycline, however after she stopped it she noticed that her ear pressure and right-sided sinus pressure was increasing.  Denies: Fever, chills, malaise   Right index finger problem About 1 month ago she was using a bagel slicer and she bent the most distal joint of her right index finger.  It has been a dull achy pain since this time.  It is not getting worse but it is not improving.  She has not taken anything for this.  She has slight decreased range of motion with this when flexing.     Past Medical History:  Diagnosis Date   Allergy    Anxiety    Asthma    Hypoglycemia    Migraine with aura      Social History   Tobacco Use   Smoking status: Former    Types: Cigarettes    Quit date: 04/24/2005    Years since quitting: 15.7   Smokeless tobacco: Never  Vaping Use   Vaping Use: Never used  Substance Use Topics   Alcohol use: Yes    Alcohol/week: 2.0 - 3.0 standard drinks    Types: 2 - 3 Glasses of wine per week   Drug use: Never    Past Surgical History:  Procedure Laterality Date   APPENDECTOMY     TONSILLECTOMY AND ADENOIDECTOMY      Family History  Problem Relation Age of Onset   Breast cancer Maternal Aunt    Diabetes Maternal Aunt    Hypertension Maternal Aunt    Breast cancer Maternal Grandmother    Breast cancer Paternal Grandmother    COPD Mother    Depression Mother    Heart attack Father  49   Hypertension Father    Heart attack Maternal Grandfather    Heart attack Paternal Grandfather     Allergies  Allergen Reactions   Doxycycline Nausea Only   Erythromycin Nausea And Vomiting    Current Medications:   Current Outpatient Medications:    albuterol (VENTOLIN HFA) 108 (90 Base) MCG/ACT inhaler, Inhale into the lungs every 6 (six) hours as needed., Disp: , Rfl:    ALPRAZolam (XANAX) 0.25 MG tablet, Take 1 tablet (0.25 mg total) by mouth every 8 (eight) hours as needed., Disp: 30 tablet, Rfl: 2   B Complex-Biotin-FA (B-COMPLEX PO), Take 0.5 tablets by mouth daily in the afternoon., Disp: , Rfl:    cholecalciferol (VITAMIN D3) 25 MCG (1000 UT) tablet, Take 1,000 Units by mouth daily., Disp: , Rfl:    diazepam (VALIUM) 2 MG tablet, Take 2 mg by mouth 3 (three) times daily as needed., Disp: , Rfl:    ferrous sulfate 324 MG TBEC, Take 65 mg by mouth., Disp: , Rfl:    fluticasone (FLONASE) 50 MCG/ACT nasal spray, Place 1 spray into both nostrils daily., Disp: , Rfl:    magnesium  30 MG tablet, Take 30 mg by mouth daily., Disp: , Rfl:    Multiple Vitamin (MULTIVITAMIN) tablet, Take 1 tablet by mouth daily., Disp: , Rfl:    norethindrone (MICRONOR) 0.35 MG tablet, Take 1 tablet (0.35 mg total) by mouth daily., Disp: 84 tablet, Rfl: 2   propranolol (INDERAL) 10 MG tablet, Take 1 tablet (10 mg total) by mouth every 12 (twelve) hours as needed., Disp: 30 tablet, Rfl: 11   Review of Systems:   ROS Negative unless otherwise specified per HPI.  Vitals:   Vitals:   01/05/21 1057  BP: 130/90  Pulse: 77  Temp: 98.2 F (36.8 C)  TempSrc: Temporal  SpO2: 96%  Weight: 187 lb (84.8 kg)  Height: '5\' 5"'$  (1.651 m)     Body mass index is 31.12 kg/m.  Physical Exam:   Physical Exam Vitals and nursing note reviewed.  Constitutional:      General: She is not in acute distress.    Appearance: She is well-developed. She is not ill-appearing or toxic-appearing.  HENT:     Head:  Normocephalic and atraumatic.     Right Ear: Ear canal and external ear normal. A middle ear effusion is present. Tympanic membrane is not erythematous, retracted or bulging.     Left Ear: Tympanic membrane, ear canal and external ear normal. Tympanic membrane is not erythematous, retracted or bulging.     Nose:     Right Sinus: Maxillary sinus tenderness and frontal sinus tenderness present.     Left Sinus: No maxillary sinus tenderness or frontal sinus tenderness.     Mouth/Throat:     Pharynx: Uvula midline. No posterior oropharyngeal erythema.  Eyes:     General: Lids are normal.     Conjunctiva/sclera: Conjunctivae normal.  Neck:     Trachea: Trachea normal.  Cardiovascular:     Rate and Rhythm: Normal rate and regular rhythm.     Heart sounds: Normal heart sounds, S1 normal and S2 normal.  Pulmonary:     Effort: Pulmonary effort is normal.     Breath sounds: Normal breath sounds. No decreased breath sounds, wheezing, rhonchi or rales.  Musculoskeletal:     Comments: Tenderness to palpation of right index finger DIP joint With flexion of finger has slight decreased range of motion  Lymphadenopathy:     Cervical: No cervical adenopathy.  Skin:    General: Skin is warm and dry.  Neurological:     Mental Status: She is alert.  Psychiatric:        Speech: Speech normal.        Behavior: Behavior normal. Behavior is cooperative.    Assessment and Plan:   1. Sinus congestion Lack of improvement with doxycycline due to medication side effect and having to end course early No red flags on exam Will start Augmentin and prednisone for her symptoms Continue to push fluid Worsening precautions discussed  2. Contusion of right index finger without damage to nail, initial encounter No red flags on exam Discussed options including x-ray, sports medicine referral, watchful waiting Patient would like to continue to watch symptoms and will reach out if she has change of plan or new  symptoms  CMA or LPN served as scribe during this visit. History, Physical, and Plan performed by medical provider. The above documentation has been reviewed and is accurate and complete.   [name], PA-C

## 2021-01-05 NOTE — Telephone Encounter (Signed)
I have sent patient's prescription to [facility], please notify patient

## 2021-02-14 ENCOUNTER — Encounter: Payer: Self-pay | Admitting: Physician Assistant

## 2021-02-14 ENCOUNTER — Other Ambulatory Visit: Payer: Self-pay | Admitting: Physician Assistant

## 2021-02-14 DIAGNOSIS — S60021A Contusion of right index finger without damage to nail, initial encounter: Secondary | ICD-10-CM

## 2021-02-16 ENCOUNTER — Ambulatory Visit (INDEPENDENT_AMBULATORY_CARE_PROVIDER_SITE_OTHER)
Admission: RE | Admit: 2021-02-16 | Discharge: 2021-02-16 | Disposition: A | Discharge status: Home / Self Care | Payer: BC Managed Care – PPO | Source: Ambulatory Visit | Attending: Physician Assistant | Admitting: Physician Assistant

## 2021-02-16 ENCOUNTER — Other Ambulatory Visit: Payer: Self-pay

## 2021-02-16 DIAGNOSIS — S60021A Contusion of right index finger without damage to nail, initial encounter: Secondary | ICD-10-CM | POA: Diagnosis not present

## 2021-02-16 DIAGNOSIS — S6991XA Unspecified injury of right wrist, hand and finger(s), initial encounter: Secondary | ICD-10-CM | POA: Diagnosis not present

## 2021-02-18 ENCOUNTER — Encounter: Payer: Self-pay | Admitting: Physician Assistant

## 2021-02-18 ENCOUNTER — Other Ambulatory Visit: Payer: Self-pay | Admitting: Physician Assistant

## 2021-02-18 DIAGNOSIS — S60021A Contusion of right index finger without damage to nail, initial encounter: Secondary | ICD-10-CM

## 2021-02-21 ENCOUNTER — Ambulatory Visit (INDEPENDENT_AMBULATORY_CARE_PROVIDER_SITE_OTHER): Payer: BC Managed Care – PPO | Admitting: Family Medicine

## 2021-02-21 ENCOUNTER — Other Ambulatory Visit: Payer: Self-pay

## 2021-02-21 ENCOUNTER — Ambulatory Visit: Payer: Self-pay

## 2021-02-21 ENCOUNTER — Encounter: Payer: Self-pay | Admitting: Family Medicine

## 2021-02-21 VITALS — BP 120/86 | HR 65 | Ht 65.0 in | Wt 190.2 lb

## 2021-02-21 DIAGNOSIS — S63630A Sprain of interphalangeal joint of right index finger, initial encounter: Secondary | ICD-10-CM | POA: Diagnosis not present

## 2021-02-21 NOTE — Patient Instructions (Addendum)
Nice to meet you today.  I've referred you to hand therapy.  Please let us know if you don't hear from them in one week regarding scheduling.  Use the Stax splint for your finger.  Please use Voltaren gel (Generic Diclofenac Gel) up to 4x daily for pain as needed.  This is available over-the-counter as both the name brand Voltaren gel and the generic diclofenac gel.   Follow-up: 6 weeks

## 2021-02-21 NOTE — Progress Notes (Signed)
    Subjective:    CC: R index finger pain  I, Shirley Fleming, LAT, ATC, am serving as scribe for Dr. [name].  HPI: Pt is a 45 y/o female presenting w/ c/o R index finger since early Aug 2022 when she hit her R index finger against a bagel slicer.  She locates her pain to her R index finger DIP.  Swelling: yes Mechanical symptoms: No Aggravating factors: writing; fastening her seatbelt; pressure to her R index finger DIP Treatments tried: ice; IBU  Diagnostic testing: R index finger XR- 02/16/21  Pertinent review of Systems: No fevers or chills  Relevant historical information: Hypertension and migraines.   Objective:    Vitals:   02/21/21 0901  BP: 120/86  Pulse: 65  SpO2: 96%   General: Well Developed, well nourished, and in no acute distress.   MSK: Right index finger slight swelling and ulnar side of the DIP normal-appearing otherwise.  Normal hand motion. Flexion and extension strength is intact. No significant laxity to UCL stress test at the DIP. Tender palpation ulnar side of the second DIP. Pulses capillary refill and sensation are intact distally.  Lab and Radiology Results  Diagnostic Limited MSK Ultrasound of: Right second DIP Intact extensor and flexor tendons.  Joint effusion present at ulnar side of DIP.  Difficult to visualize collateral ligaments. Impression: Traumatic synovitis versus tear of ulnar collateral ligament and second DIP  DG Finger Index Right  Result Date: 02/17/2021 CLINICAL DATA:  Index finger injury with ongoing pain. EXAM: RIGHT INDEX FINGER 2+V COMPARISON:  None. FINDINGS: There is no evidence of fracture or dislocation. There is no evidence of arthropathy or other focal bone abnormality. Soft tissues are unremarkable. IMPRESSION: Negative. Electronically Signed   By: Ronney Asters M.D.   On: 02/17/2021 23:19   I, [name], personally (independently) visualized and performed the interpretation of the images attached in this  note.   Impression and Recommendations:    Assessment and Plan: 45 y.o. female with right second DIP pain after an injury occurring over 2 months ago.  Patient has residual traumatic synovitis to the second DIP.  She is a good candidate for hand therapy.  In the meantime we will protect the finger as needed with a STAX splint and use Voltaren gel.  Check back in 6 weeks.  PDMP not reviewed this encounter. Orders Placed This Encounter  Procedures   [place] LIMITED JOINT SPACE STRUCTURES UP RIGHT(NO LINKED CHARGES)    Order Specific Question:   Reason for Exam (SYMPTOM  OR DIAGNOSIS REQUIRED)    Answer:   R index finger pain    Order Specific Question:   Preferred imaging location?    Answer:   [place]   Ambulatory referral to Physical Therapy    Referral Priority:   Routine    Referral Type:   Physical Medicine    Referral Reason:   Specialty Services Required    Requested Specialty:   Physical Therapy    Number of Visits Requested:   1   No orders of the defined types were placed in this encounter.   Discussed warning test signs or symptoms. Please see discharge instructions. Patient expresses understanding.   The above documentation has been reviewed and is accurate and complete [name], M.D.

## 2021-02-23 DIAGNOSIS — M25541 Pain in joints of right hand: Secondary | ICD-10-CM | POA: Diagnosis not present

## 2021-03-02 DIAGNOSIS — M25541 Pain in joints of right hand: Secondary | ICD-10-CM | POA: Diagnosis not present

## 2021-03-05 ENCOUNTER — Encounter: Payer: Self-pay | Admitting: Physician Assistant

## 2021-03-08 NOTE — Progress Notes (Signed)
Shirley Fleming is a 45 y.o. female here for otalgia.   History of Present Illness:   Chief Complaint  Patient presents with  . Otalgia    Pt has been having pain in right ear x several weeks, getting worse. Feels like fluid in it. Also having facial pain and headaches. She is occasionally taking Ibuprofen and Flonase.    HPI  Otalgia Shirley Fleming presents with c/o otalgia of the right ear that she reports has been presents for several weeks. Initially she states she felt an itchy sensation prior to the bottom of her right eye swelling slightly. As time went on the pain started to worsen as well as presenting headaches, post nasal drainage down her throat, and facial pain. She has been using flonase nasal spray in both nostrils one a day, sometimes BID. Shirley Fleming has not tried any decongestants due to them drying out her sinuses. Denies drainage from ear.   Irregular Menstrual Cycle/Anxiety In addition to her ear pain, Shirley Fleming has been experiencing irregular menstrual cycles as well as unstable moods. Pt reports she had started her cycle on 02/20/21 which has continued for two weeks. She has also been experiencing mood swings, including excessive crying and aggression as well as increased hair loss. She does recall an episode of irregularity earlier this year. During that time she was having spotting for three months which resolved itself after she received her COVID vaccination.   At this moment Shirley Fleming expressed she either believed having COVID in September could be related to her current issue or  it could be a hormonal imbalance/perimenopause. She has admitted to having a fhx of premature menopause and hx of postpartum depression following her first pregnancy as well as newly onset migraines. Shirley Fleming heavily believes her migraines are tied to abnormal hormone changes. In regards to her unstable mood, she is not interested in medication intervention for fear it could mask a potential hormonal issue.     Past Medical History:  Diagnosis Date  . Allergy   . Anxiety   . Asthma   . Hypoglycemia   . Migraine with aura      Social History   Tobacco Use  . Smoking status: Former    Types: Cigarettes    Quit date: 04/24/2005    Years since quitting: 15.8  . Smokeless tobacco: Never  Vaping Use  . Vaping Use: Never used  Substance Use Topics  . Alcohol use: Yes    Alcohol/week: 2.0 - 3.0 standard drinks    Types: 2 - 3 Glasses of wine per week  . Drug use: Never    Past Surgical History:  Procedure Laterality Date  . APPENDECTOMY    . TONSILLECTOMY AND ADENOIDECTOMY      Family History  Problem Relation Age of Onset  . Breast cancer Maternal Aunt   . Diabetes Maternal Aunt   . Hypertension Maternal Aunt   . Breast cancer Maternal Grandmother   . Breast cancer Paternal Grandmother   . COPD Mother   . Depression Mother   . Heart attack Father 57  . Hypertension Father   . Heart attack Maternal Grandfather   . Heart attack Paternal Grandfather     Allergies  Allergen Reactions  . Doxycycline Nausea Only  . Erythromycin Nausea And Vomiting    Current Medications:   Current Outpatient Medications:  .  albuterol (VENTOLIN HFA) 108 (90 Base) MCG/ACT inhaler, Inhale into the lungs every 6 (six) hours as needed., Disp: , Rfl:  .  ALPRAZolam (XANAX) 0.25 MG tablet, Take 1 tablet (0.25 mg total) by mouth every 8 (eight) hours as needed., Disp: 30 tablet, Rfl: 2 .  B Complex-Biotin-FA (B-COMPLEX PO), Take 0.5 tablets by mouth daily in the afternoon., Disp: , Rfl:  .  cholecalciferol (VITAMIN D3) 25 MCG (1000 UT) tablet, Take 1,000 Units by mouth daily., Disp: , Rfl:  .  diazepam (VALIUM) 2 MG tablet, Take 2 mg by mouth 3 (three) times daily as needed., Disp: , Rfl:  .  ferrous sulfate 324 MG TBEC, Take 65 mg by mouth., Disp: , Rfl:  .  fluticasone (FLONASE) 50 MCG/ACT nasal spray, Place 1 spray into both nostrils daily., Disp: , Rfl:  .  magnesium 30 MG tablet, Take 30  mg by mouth daily., Disp: , Rfl:  .  Multiple Vitamin (MULTIVITAMIN) tablet, Take 1 tablet by mouth daily., Disp: , Rfl:  .  norethindrone (MICRONOR) 0.35 MG tablet, Take 1 tablet (0.35 mg total) by mouth daily., Disp: 84 tablet, Rfl: 2 .  propranolol (INDERAL) 10 MG tablet, Take 1 tablet (10 mg total) by mouth every 12 (twelve) hours as needed., Disp: 30 tablet, Rfl: 11   Review of Systems:   ROS Negative unless otherwise specified per HPI. Vitals:   Vitals:   03/09/21 0827  BP: 120/80  Pulse: 68  Temp: 98.1 F (36.7 C)  TempSrc: Temporal  SpO2: 96%  Weight: 187 lb 4 oz (84.9 kg)  Height: 5\' 5"  (1.651 m)     Body mass index is 31.16 kg/m.  Physical Exam:   Physical Exam Vitals and nursing note reviewed.  Constitutional:      General: She is not in acute distress.    Appearance: She is well-developed. She is not ill-appearing or toxic-appearing.  HENT:     Right Ear: A middle ear effusion is present.     Left Ear: A middle ear effusion is present.  Cardiovascular:     Rate and Rhythm: Normal rate and regular rhythm.     Pulses: Normal pulses.     Heart sounds: Normal heart sounds, S1 normal and S2 normal.  Pulmonary:     Effort: Pulmonary effort is normal.     Breath sounds: Normal breath sounds.  Skin:    General: Skin is warm and dry.  Neurological:     Mental Status: She is alert.     GCS: GCS eye subscore is 4. GCS verbal subscore is 5. GCS motor subscore is 6.  Psychiatric:        Speech: Speech normal.        Behavior: Behavior normal. Behavior is cooperative.    Assessment and Plan:   Dysfunction of both eustachian tubes Ongoing Stop flonase and trial astelin May consider adding oral prednisone again if needed Will go ahead and put in ENT referral if this is unsuccessful Follow-up with [place] as needed in the interim  Irregular menstruation Symptoms continue to cause significant disruption to her life She would like to consider low dose COC's even  though she has hx of migraines with aura. She is aware of increase stroke risk while on this medication. I will reach out to her gynecologist to see if they can help her with this.  Anxiety Uncontrolled Emotional support provided Therapist recommendation provided Propranolol 10 mg prn has been helpful Follow-up with [place] prn, declines SSRI at this time   I,Havlyn C Ratchford,acting as a scribe for [employer], PA.,have documented all relevant documentation on the behalf of  [name], PA,as directed by  [name], PA while in the presence of [place], [place].  I, [place], [place], have reviewed all documentation for this visit. The documentation on 03/09/21 for the exam, diagnosis, procedures, and orders are all accurate and complete.   [name], PA-C

## 2021-03-09 ENCOUNTER — Other Ambulatory Visit: Payer: Self-pay

## 2021-03-09 ENCOUNTER — Other Ambulatory Visit (HOSPITAL_BASED_OUTPATIENT_CLINIC_OR_DEPARTMENT_OTHER): Payer: Self-pay

## 2021-03-09 ENCOUNTER — Encounter: Payer: Self-pay | Admitting: Physician Assistant

## 2021-03-09 ENCOUNTER — Ambulatory Visit: Payer: BC Managed Care – PPO | Admitting: Physician Assistant

## 2021-03-09 VITALS — BP 120/80 | HR 68 | Temp 98.1°F | Ht 65.0 in | Wt 187.2 lb

## 2021-03-09 DIAGNOSIS — N926 Irregular menstruation, unspecified: Secondary | ICD-10-CM | POA: Diagnosis not present

## 2021-03-09 DIAGNOSIS — M25541 Pain in joints of right hand: Secondary | ICD-10-CM | POA: Diagnosis not present

## 2021-03-09 DIAGNOSIS — H6993 Unspecified Eustachian tube disorder, bilateral: Secondary | ICD-10-CM

## 2021-03-09 DIAGNOSIS — H6983 Other specified disorders of Eustachian tube, bilateral: Secondary | ICD-10-CM

## 2021-03-09 DIAGNOSIS — F419 Anxiety disorder, unspecified: Secondary | ICD-10-CM | POA: Diagnosis not present

## 2021-03-09 MED ORDER — AZELASTINE HCL 0.1 % NA SOLN
2.0000 | Freq: Two times a day (BID) | NASAL | 12 refills | Status: DC
  Filled 2021-03-09: qty 30, 30d supply, fill #0

## 2021-03-09 NOTE — Patient Instructions (Signed)
It was great to see you!  [email or web address]  --Please look up and reach out to her to consider scheduling  I will message Dr Quincy Simmonds within the next 48 hours  Start Astelin nasal spray instead of flonase I will put in referral for ENT in the meantime If you need prednisone, please let me know  Elbert Ewings in there!  Take care,  [name] PA-C

## 2021-03-30 NOTE — Progress Notes (Signed)
   I, Wendy Poet, LAT, ATC, am serving as scribe for Dr. [name].  Shirley Fleming is a 45 y.o. female who presents to [place] at [facility] today for f/u of a R index finger DIP sprain w/ synovitis after striking her finger against a bagel slicer in early Aug 1975.  She was last seen by Dr. Georgina Snell on 02/21/21 and was referred to hand therapy.  Today, pt reports that her R index finger is feeling much better.  She went to a few hand therapy sessions at [facility] and has been d/c.  She has been doing her HEP per PT.    Diagnostic testing: R index finger XR- 02/16/21  Pertinent review of systems: No fevers or chills  Relevant historical information: Hypertension and migraines.  Vitamin D deficiency   Exam:  BP 140/90 (BP Location: Right Arm, Patient Position: Sitting, Cuff Size: Normal)   Pulse 80   Ht 5\' 5"  (1.651 m)   Wt 188 lb 9.6 oz (85.5 kg)   SpO2 96%   BMI 31.38 kg/m  General: Well Developed, well nourished, and in no acute distress.   MSK: Right index finger normal-appearing nontender normal motion.    Lab and Radiology Results =EXAM: RIGHT INDEX FINGER 2+V   COMPARISON:  None.   FINDINGS: There is no evidence of fracture or dislocation. There is no evidence of arthropathy or other focal bone abnormality. Soft tissues are unremarkable.   IMPRESSION: Negative.     Electronically Signed   By: Ronney Asters M.D.   On: 02/17/2021 23:19   I, [name], personally (independently) visualized and performed the interpretation of the images attached in this note.      Assessment and Plan: 45 y.o. female with right index finger pain at DIP.  Pain thought to be due to a traumatic synovitis.  Patient has had significant improvement with physical therapy and has now been discharged to home exercise program.  Plan to continue home exercise program and recheck back with me as needed.  Precautions reviewed.  Happy to refer back to hand PT if  needed.  Certainly could do a steroid injection into the DIP if needed however her symptoms do not warrant it at this time.     Discussed warning signs or symptoms. Please see discharge instructions. Patient expresses understanding.   The above documentation has been reviewed and is accurate and complete [name], M.D.

## 2021-04-04 ENCOUNTER — Encounter: Payer: Self-pay | Admitting: Family Medicine

## 2021-04-04 ENCOUNTER — Ambulatory Visit (INDEPENDENT_AMBULATORY_CARE_PROVIDER_SITE_OTHER): Payer: BC Managed Care – PPO | Admitting: Family Medicine

## 2021-04-04 ENCOUNTER — Telehealth: Payer: BC Managed Care – PPO | Admitting: Emergency Medicine

## 2021-04-04 ENCOUNTER — Other Ambulatory Visit: Payer: Self-pay

## 2021-04-04 ENCOUNTER — Other Ambulatory Visit (HOSPITAL_BASED_OUTPATIENT_CLINIC_OR_DEPARTMENT_OTHER): Payer: Self-pay

## 2021-04-04 VITALS — BP 140/90 | HR 80 | Ht 65.0 in | Wt 188.6 lb

## 2021-04-04 DIAGNOSIS — M79644 Pain in right finger(s): Secondary | ICD-10-CM

## 2021-04-04 DIAGNOSIS — J019 Acute sinusitis, unspecified: Secondary | ICD-10-CM | POA: Diagnosis not present

## 2021-04-04 DIAGNOSIS — B9689 Other specified bacterial agents as the cause of diseases classified elsewhere: Secondary | ICD-10-CM | POA: Diagnosis not present

## 2021-04-04 MED ORDER — PREDNISONE 10 MG PO TABS
ORAL_TABLET | Freq: Every day | ORAL | 0 refills | Status: DC
  Filled 2021-04-04: qty 42, 12d supply, fill #0

## 2021-04-04 MED ORDER — AMOXICILLIN-POT CLAVULANATE 875-125 MG PO TABS
1.0000 | ORAL_TABLET | Freq: Two times a day (BID) | ORAL | 0 refills | Status: DC
  Filled 2021-04-04: qty 14, 7d supply, fill #0

## 2021-04-04 NOTE — Addendum Note (Signed)
Addended by: Rodney Booze on: 04/04/2021 01:03 PM   Modules accepted: Orders

## 2021-04-04 NOTE — Progress Notes (Signed)

## 2021-04-04 NOTE — Patient Instructions (Signed)
Good to see you today.  Glad you're feeling/doing better.  Keep up the home exercises.  Happy Holidays!  Follow-up as needed.

## 2021-05-16 ENCOUNTER — Encounter: Payer: Self-pay | Admitting: Physician Assistant

## 2021-05-17 NOTE — Progress Notes (Signed)
Shirley Fleming is a 46 y.o. female here for depression.  History of Present Illness:   Chief Complaint  Patient presents with   Depression    Pt c/o feeling depressed x 5 months.    HPI  Depression [name] presents with c/o feeling depressed for the past 5 months. During our previous visit on 03/09/21, pt expressed she was having some issues with depression post covid illness, but wasn't sure she should restart medication. In the past she has trialed prozac, wellbutrin, and zoloft, being on zoloft for the longest. She found the zoloft the most beneficial but discontinued use due to struggle losing weight while on the medication and also feeling like she no longer needed it.   Although she previously questioned if she should restart medication, she has now come to terms that it is time to do so. With the added stress of her son having health issues and participating in talk therapy himself, her mood has taken a significant dive. Currently interested in trialing prozac versus zoloft at this time. Upon further discussion, [name] admits that she has had some SI, but has no plan. She does find support in her husband.   Abnormal Right Ear Sensation Additionally, pt has been experiencing an abnormal sensation in her right ear which she believes to be water from a shower. States this has been present for a couple of days and is annoying. In an effort to find relief, she has tried using a q-tip to soak up the water and using her son's swimmer's ear solution but was provided with no relief. Denies pain or dizziness.   Past Medical History:  Diagnosis Date   Allergy    Anxiety    Asthma    Hypoglycemia    Migraine with aura      Social History   Tobacco Use   Smoking status: Former    Types: Cigarettes    Quit date: 04/24/2005    Years since quitting: 16.0   Smokeless tobacco: Never  Vaping Use   Vaping Use: Never used  Substance Use Topics   Alcohol use: Yes    Alcohol/week: 2.0 - 3.0  standard drinks    Types: 2 - 3 Glasses of wine per week   Drug use: Never    Past Surgical History:  Procedure Laterality Date   APPENDECTOMY     TONSILLECTOMY AND ADENOIDECTOMY      Family History  Problem Relation Age of Onset   Breast cancer Maternal Aunt    Diabetes Maternal Aunt    Hypertension Maternal Aunt    Breast cancer Maternal Grandmother    Breast cancer Paternal Grandmother    COPD Mother    Depression Mother    Heart attack Father 40   Hypertension Father    Heart attack Maternal Grandfather    Heart attack Paternal Grandfather     Allergies  Allergen Reactions   Doxycycline Nausea Only   Erythromycin Nausea And Vomiting    Current Medications:   Current Outpatient Medications:    albuterol (VENTOLIN HFA) 108 (90 Base) MCG/ACT inhaler, Inhale into the lungs every 6 (six) hours as needed., Disp: , Rfl:    ALPRAZolam (XANAX) 0.25 MG tablet, Take 1 tablet (0.25 mg total) by mouth every 8 (eight) hours as needed., Disp: 30 tablet, Rfl: 2   B Complex-Biotin-FA (B-COMPLEX PO), Take 0.5 tablets by mouth daily in the afternoon., Disp: , Rfl:    cholecalciferol (VITAMIN D3) 25 MCG (1000 UT) tablet, Take 1,000 Units  by mouth daily., Disp: , Rfl:    ferrous sulfate 324 MG TBEC, Take 65 mg by mouth., Disp: , Rfl:    fluticasone (FLONASE) 50 MCG/ACT nasal spray, Place 1 spray into both nostrils daily., Disp: , Rfl:    magnesium 30 MG tablet, Take 30 mg by mouth daily., Disp: , Rfl:    Multiple Vitamin (MULTIVITAMIN) tablet, Take 1 tablet by mouth daily., Disp: , Rfl:    norethindrone (MICRONOR) 0.35 MG tablet, Take 1 tablet (0.35 mg total) by mouth daily., Disp: 84 tablet, Rfl: 2   propranolol (INDERAL) 10 MG tablet, Take 1 tablet (10 mg total) by mouth every 12 (twelve) hours as needed., Disp: 30 tablet, Rfl: 11   diazepam (VALIUM) 2 MG tablet, Take 2 mg by mouth 3 (three) times daily as needed. (Patient not taking: Reported on 05/18/2021), Disp: , Rfl:    Review of  Systems:   ROS Negative unless otherwise specified per HPI. Vitals:   Vitals:   05/18/21 0829  BP: 122/80  Pulse: 70  Temp: 97.8 F (36.6 C)  TempSrc: Temporal  SpO2: 96%  Weight: 191 lb 6.1 oz (86.8 kg)  Height: 5\' 6"  (1.676 m)     Body mass index is 30.89 kg/m.  Physical Exam:   Physical Exam Vitals and nursing note reviewed.  Constitutional:      General: She is not in acute distress.    Appearance: She is well-developed. She is not ill-appearing or toxic-appearing.  HENT:     Right Ear: Tympanic membrane and external ear normal. Drainage present. No middle ear effusion.     Left Ear: Tympanic membrane, ear canal and external ear normal.  Cardiovascular:     Rate and Rhythm: Normal rate and regular rhythm.     Pulses: Normal pulses.     Heart sounds: Normal heart sounds, S1 normal and S2 normal.  Pulmonary:     Effort: Pulmonary effort is normal.     Breath sounds: Normal breath sounds.  Skin:    General: Skin is warm and dry.  Neurological:     Mental Status: She is alert.     GCS: GCS eye subscore is 4. GCS verbal subscore is 5. GCS motor subscore is 6.  Psychiatric:        Speech: Speech normal.        Behavior: Behavior normal. Behavior is cooperative.    Assessment and Plan:   Abnormal sensation in right ear No red flags on exam Use Vosol- HC OTIC solution, place 3 drops into both ears 3 times daily  Follow up as needed   Depression, major, single episode, severe ([place]) Uncontrolled Denies active suicidal thoughts or plan at today's visit Start prozac 10 mg daily  Recommended patient participate in talk therapy, will provide recommendation as needed I advised patient that if they develop worsening SI, to tell someone immediately and seek medical attention  Follow up in 3 months, sooner if concerns  I,Havlyn C Ratchford,acting as a scribe for [employer], PA.,have documented all relevant documentation on the behalf of Shirley Coke, PA,as  directed by  Shirley Coke, PA while in the presence of Shirley Fleming, [place].  I, Shirley Fleming, [place], have reviewed all documentation for this visit. The documentation on 05/18/21 for the exam, diagnosis, procedures, and orders are all accurate and complete.   Shirley Coke, PA-C

## 2021-05-18 ENCOUNTER — Encounter: Payer: Self-pay | Admitting: Physician Assistant

## 2021-05-18 ENCOUNTER — Other Ambulatory Visit (HOSPITAL_BASED_OUTPATIENT_CLINIC_OR_DEPARTMENT_OTHER): Payer: Self-pay

## 2021-05-18 ENCOUNTER — Ambulatory Visit: Payer: BC Managed Care – PPO | Admitting: Physician Assistant

## 2021-05-18 ENCOUNTER — Other Ambulatory Visit: Payer: Self-pay

## 2021-05-18 VITALS — BP 122/80 | HR 70 | Temp 97.8°F | Ht 66.0 in | Wt 191.4 lb

## 2021-05-18 DIAGNOSIS — F322 Major depressive disorder, single episode, severe without psychotic features: Secondary | ICD-10-CM

## 2021-05-18 DIAGNOSIS — H938X1 Other specified disorders of right ear: Secondary | ICD-10-CM

## 2021-05-18 MED ORDER — FLUOXETINE HCL 10 MG PO CAPS
10.0000 mg | ORAL_CAPSULE | Freq: Every day | ORAL | 0 refills | Status: DC
  Filled 2021-05-18: qty 30, 30d supply, fill #0
  Filled 2021-12-20: qty 30, 30d supply, fill #1
  Filled 2022-01-15: qty 23, 23d supply, fill #2
  Filled 2022-01-16: qty 7, 7d supply, fill #2

## 2021-05-18 MED ORDER — HYDROCORTISONE-ACETIC ACID 1-2 % OT SOLN
3.0000 [drp] | Freq: Three times a day (TID) | OTIC | 0 refills | Status: DC
  Filled 2021-05-18: qty 10, 22d supply, fill #0

## 2021-05-18 NOTE — Patient Instructions (Addendum)
It was great to see you!  Start 10 mg prozac daily  If you develop suicidal thoughts, please tell someone and immediately proceed to our local 24/7 crisis center, [facility] at the [facility]. [address] [place], [place] [address] [phone].  When you are ready for talk therapy, let me know  Let's follow-up within 3 months, sooner if you have concerns.  Take care,  [name] PA-C

## 2021-06-08 ENCOUNTER — Other Ambulatory Visit: Payer: Self-pay | Admitting: Obstetrics and Gynecology

## 2021-06-08 NOTE — Telephone Encounter (Signed)
AEX 07/22/20. No appt scheduled.   Neg mammo 08/03/20.

## 2021-07-02 ENCOUNTER — Other Ambulatory Visit: Payer: Self-pay | Admitting: Obstetrics and Gynecology

## 2021-07-04 NOTE — Telephone Encounter (Signed)
Last AEX 07/22/20--scheduled for 08/05/21.  ?Last mammo 08/03/20.  ?

## 2021-07-10 ENCOUNTER — Other Ambulatory Visit: Payer: Self-pay | Admitting: Cardiology

## 2021-07-25 NOTE — Progress Notes (Signed)
46 y.o. G48P0002 Married Caucasian female here for annual exam.   ? ?Patient wants to discuss birth control options. ? ?Having irregular cycles since she got Covid in September. ?Occur every 2 - 4 weeks and bleeding in between.  ?Her current period has had a lot of clotting.   ?Cramping now, but not usually so painful. ?Taking her pills regularly.  ?Normal pelvic US 10/31/18. ? ?She does not want to stop her birth control pills due to really heavy bleeding that is uncontrolled.  ? ?Still has migraine with possible aura. ?This occurs only on day 3 with her menstrual cycle. ? ?She declines an IUD.  ?Is not interested.  ? ?Wants to be tested for breast cancer gene. ? ?Prescribed Prozac but has not taken it.  ? ?PCP:   [name], PA ? ?Patient's last menstrual period was 07/31/2021 (exact date).     ?Period Pattern: (!) Irregular ?    ?Sexually active: Yes.    ?The current method of family planning is oral progesterone-only contraceptive/vasectomy.    ?Exercising: Yes.     Personal trainer ?Smoker:  Former ? ?Health Maintenance: ?Pap:   07-08-19 Neg:Neg HR HPV, 07-07-18 Neg ?History of abnormal Pap:  yes,  Cryo at age 25 ?MMG:  08-03-20 Neg/BiRads1--Pt. Knows to schedule ?Colonoscopy:  N/A ?BMD:   N/A  Result:n/a ?TDaP:  2022 ?Gardasil:   no ?HIV:Neg ?Hep C:  ?Screening Labs:  PCP. ? ? reports that she quit smoking about 16 years ago. Her smoking use included cigarettes. She has never used smokeless tobacco. She reports current alcohol use of about 2.0 - 3.0 standard drinks per week. She reports that she does not use drugs. ? ?Past Medical History:  ?Diagnosis Date  ? Allergy   ? Anxiety   ? Asthma   ? History of COVID-19 12/23/2020  ? Hypoglycemia   ? Migraine with aura   ? ? ?Past Surgical History:  ?Procedure Laterality Date  ? APPENDECTOMY    ? TONSILLECTOMY AND ADENOIDECTOMY    ? ? ?Current Outpatient Medications  ?Medication Sig Dispense Refill  ? albuterol (VENTOLIN HFA) 108 (90 Base) MCG/ACT inhaler Inhale into  the lungs every 6 (six) hours as needed.    ? ALPRAZolam (XANAX) 0.25 MG tablet Take 1 tablet (0.25 mg total) by mouth every 8 (eight) hours as needed. 30 tablet 2  ? B Complex-Biotin-FA (B-COMPLEX PO) Take 0.5 tablets by mouth daily in the afternoon.    ? cholecalciferol (VITAMIN D3) 25 MCG (1000 UT) tablet Take 1,000 Units by mouth daily.    ? ferrous sulfate 324 MG TBEC Take 65 mg by mouth.    ? FLUoxetine (PROZAC) 10 MG capsule Take 1 capsule (10 mg total) by mouth daily. 90 capsule 0  ? fluticasone (FLONASE) 50 MCG/ACT nasal spray Place 1 spray into both nostrils daily.    ? magnesium 30 MG tablet Take 30 mg by mouth daily.    ? Multiple Vitamin (MULTIVITAMIN) tablet Take 1 tablet by mouth daily.    ? norethindrone (MICRONOR) 0.35 MG tablet TAKE 1 TABLET BY MOUTH EVERY DAY 28 tablet 1  ? propranolol (INDERAL) 10 MG tablet TAKE 1 TABLET (10 MG TOTAL) BY MOUTH EVERY 12 (TWELVE) HOURS AS NEEDED. 60 tablet 2  ? ?No current facility-administered medications for this visit.  ? ? ?Family History  ?Problem Relation Age of Onset  ? Breast cancer Maternal Aunt   ? Diabetes Maternal Aunt   ? Hypertension Maternal Aunt   ? Breast  cancer Maternal Grandmother   ? Breast cancer Paternal Grandmother   ? COPD Mother   ? Depression Mother   ? Heart attack Father 16  ? Hypertension Father   ? Heart attack Maternal Grandfather   ? Heart attack Paternal Grandfather   ? ? ?Review of Systems  ?Genitourinary:  Positive for menstrual problem (irregular cycles even with POP).  ?All other systems reviewed and are negative. ? ?Exam:   ?BP 112/70   Pulse 63   Ht '5\' 5"'$  (1.651 m)   Wt 192 lb (87.1 kg)   LMP 07/31/2021 (Exact Date)   SpO2 97%   BMI 31.95 kg/m?     ?General appearance: alert, cooperative and appears stated age ?Head: normocephalic, without obvious abnormality, atraumatic ?Neck: no adenopathy, supple, symmetrical, trachea midline and thyroid normal to inspection and palpation ?Lungs: clear to auscultation  bilaterally ?Breasts: normal appearance, no masses or tenderness, No nipple retraction or dimpling, No nipple discharge or bleeding, No axillary adenopathy ?Heart: regular rate and rhythm ?Abdomen: soft, non-tender; no masses, no organomegaly ?Extremities: extremities normal, atraumatic, no cyanosis or edema ?Skin: skin color, texture, turgor normal. No rashes or lesions ?Lymph nodes: cervical, supraclavicular, and axillary nodes normal. ?Neurologic: grossly normal ? ?Pelvic: External genitalia:  no lesions ?             No abnormal inguinal nodes palpated. ?             Urethra:  normal appearing urethra with no masses, tenderness or lesions ?             Bartholins and Skenes: normal    ?             Vagina: normal appearing vagina with normal color and discharge, no lesions ?             Cervix: no lesions.  Menstrual flow noted.  ?             Pap taken:  no ?Bimanual Exam:  Uterus:  normal size, contour, position, consistency, mobility, non-tender ?             Adnexa: no mass, fullness, tenderness ?             Rectal exam: yes.  Confirms. ?             Anus:  normal sphincter tone, no lesions ? ?Chaperone was present for exam:  Estill Bamberg, CMA ? ?Assessment:   ?Well woman visit with gynecologic exam. ?Abnormal uterine bleeding. ?Vasectomy for pregnancy prevention.  ?Possible migraine with aura. ?FH breast cancer, maternal and paternal sides of the family.  ? ?Plan: ?Mammogram screening discussed. ?Self breast awareness reviewed. ?Pap and HR HPV 2026. ?Guidelines for Calcium, Vitamin D, regular exercise program including cardiovascular and weight bearing exercise.  ?Refill of Micronor for one year.  She will continue this for now. ?Referral for genetic testing.  ?Pelvic ultrasound and EMB.  ?TSH and CBC. ?We discussed colon cancer screening.   She will consider.  ?Follow up annually and prn.  ? ?After visit summary provided.  ? ? ?  ?

## 2021-07-28 ENCOUNTER — Other Ambulatory Visit: Payer: Self-pay | Admitting: Obstetrics and Gynecology

## 2021-08-05 ENCOUNTER — Other Ambulatory Visit (HOSPITAL_BASED_OUTPATIENT_CLINIC_OR_DEPARTMENT_OTHER): Payer: Self-pay

## 2021-08-05 ENCOUNTER — Ambulatory Visit (INDEPENDENT_AMBULATORY_CARE_PROVIDER_SITE_OTHER): Payer: BC Managed Care – PPO | Admitting: Obstetrics and Gynecology

## 2021-08-05 ENCOUNTER — Telehealth: Payer: Self-pay | Admitting: Licensed Clinical Social Worker

## 2021-08-05 ENCOUNTER — Encounter: Payer: Self-pay | Admitting: Obstetrics and Gynecology

## 2021-08-05 VITALS — BP 112/70 | HR 63 | Ht 65.0 in | Wt 192.0 lb

## 2021-08-05 DIAGNOSIS — N939 Abnormal uterine and vaginal bleeding, unspecified: Secondary | ICD-10-CM

## 2021-08-05 DIAGNOSIS — Z01419 Encounter for gynecological examination (general) (routine) without abnormal findings: Secondary | ICD-10-CM

## 2021-08-05 DIAGNOSIS — Z803 Family history of malignant neoplasm of breast: Secondary | ICD-10-CM | POA: Diagnosis not present

## 2021-08-05 MED ORDER — NORETHINDRONE 0.35 MG PO TABS
1.0000 | ORAL_TABLET | Freq: Every day | ORAL | 3 refills | Status: DC
  Filled 2021-08-05: qty 84, 84d supply, fill #0
  Filled 2021-08-15 – 2021-08-26 (×2): qty 28, 28d supply, fill #0
  Filled 2021-09-26: qty 28, 28d supply, fill #1
  Filled 2021-10-31: qty 28, 28d supply, fill #2
  Filled 2021-11-29: qty 28, 28d supply, fill #3
  Filled 2021-12-28: qty 28, 28d supply, fill #4
  Filled 2022-01-26: qty 28, 28d supply, fill #5
  Filled 2022-02-20: qty 28, 28d supply, fill #6
  Filled 2022-03-19: qty 28, 28d supply, fill #7
  Filled 2022-04-16: qty 28, 28d supply, fill #8
  Filled 2022-05-19: qty 28, 28d supply, fill #9
  Filled 2022-06-14: qty 28, 28d supply, fill #10
  Filled 2022-07-12: qty 28, 28d supply, fill #11

## 2021-08-05 NOTE — Patient Instructions (Signed)

## 2021-08-05 NOTE — Telephone Encounter (Signed)
Scheduled appointment per 04/14 referral. Patient is aware of appointment date and time. Patient is aware to arrive 15 mins prior to appointment time and to bring updated insurance cards. Patient is aware of location.   ?

## 2021-08-06 LAB — CBC
HCT: 44.4 % (ref 35.0–45.0)
Hemoglobin: 14.8 g/dL (ref 11.7–15.5)
MCH: 30.1 pg (ref 27.0–33.0)
MCHC: 33.3 g/dL (ref 32.0–36.0)
MCV: 90.4 fL (ref 80.0–100.0)
MPV: 11.8 fL (ref 7.5–12.5)
Platelets: 204 10*3/uL (ref 140–400)
RBC: 4.91 10*6/uL (ref 3.80–5.10)
RDW: 12 % (ref 11.0–15.0)
WBC: 6.9 10*3/uL (ref 3.8–10.8)

## 2021-08-06 LAB — TSH: TSH: 2.03 mIU/L

## 2021-08-09 ENCOUNTER — Ambulatory Visit (INDEPENDENT_AMBULATORY_CARE_PROVIDER_SITE_OTHER): Payer: BC Managed Care – PPO

## 2021-08-09 ENCOUNTER — Ambulatory Visit (INDEPENDENT_AMBULATORY_CARE_PROVIDER_SITE_OTHER): Payer: BC Managed Care – PPO | Admitting: Family Medicine

## 2021-08-09 ENCOUNTER — Encounter: Payer: Self-pay | Admitting: Family Medicine

## 2021-08-09 VITALS — BP 140/82 | HR 73 | Ht 65.0 in | Wt 194.2 lb

## 2021-08-09 DIAGNOSIS — G8929 Other chronic pain: Secondary | ICD-10-CM

## 2021-08-09 DIAGNOSIS — M5441 Lumbago with sciatica, right side: Secondary | ICD-10-CM

## 2021-08-09 DIAGNOSIS — M25551 Pain in right hip: Secondary | ICD-10-CM

## 2021-08-09 DIAGNOSIS — M47816 Spondylosis without myelopathy or radiculopathy, lumbar region: Secondary | ICD-10-CM | POA: Diagnosis not present

## 2021-08-09 NOTE — Progress Notes (Deleted)
   I, Wendy Poet, LAT, ATC, am serving as scribe for Dr. [name].  Shirley Fleming is a 46 y.o. female who presents to [place] at [facility] today for R hip pain.  She was last seen by Dr. Georgina Snell on 04/04/21 for f/u of a R index finger DIP sprain.  Today, pt reports R hip pain x .  She locates her pain to .  Radiating pain: R hip mechanical symptoms: Aggravating factors: Treatments tried:    Pertinent review of systems: ***  Relevant historical information: ***   Exam:  LMP 07/31/2021 (Exact Date)  General: Well Developed, well nourished, and in no acute distress.   MSK: ***    Lab and Radiology Results No results found for this or any previous visit (from the past 72 hour(s)). No results found.     Assessment and Plan: 46 y.o. female with ***   PDMP not reviewed this encounter. No orders of the defined types were placed in this encounter.  No orders of the defined types were placed in this encounter.    Discussed warning signs or symptoms. Please see discharge instructions. Patient expresses understanding.   ***

## 2021-08-09 NOTE — Patient Instructions (Addendum)
Good to see you today. ? ?I've referred you to PT at [facility].  Their office will call you to schedule but please let us know if you don't hear from them in one week regarding scheduling. ? ?Please get an Xray today before you leave. ? ?Follow-up: 6 weeks ?

## 2021-08-09 NOTE — Progress Notes (Signed)
? ?I, Wendy Poet, LAT, ATC, am serving as scribe for Dr. [name]. ? ?Shirley Fleming is a 46 y.o. female who presents to [place] at [facility] today for R hip pain. Pt was previously seen by Dr. Georgina Snell on 04/04/21 for a R index finger DIP sprain. Today, pt c/o  chronic R hip pain that has worsened over the last month. Pt locates pain to her R ant-lat hip w/ some radiating pain into her R ant thigh. ? ?Low back pain: yes but chronic ?Radiating pain:yes into her R ant thigh ?R hip mechanical symptoms: yes ?LE numbness/tingling: yes into her R ant thigh ?Aggravates: sitting; R hip flexion; walking; squatting ?Treatments tried: ice; heat; IBU ? ?Pertinent review of systems: No fevers or chills ? ?Relevant historical information: Son has Ehlers-Danlos.  Hypertension. ? ? ?Exam:  ?BP 140/82 (BP Location: Right Arm, Patient Position: Sitting, Cuff Size: Normal)   Pulse 73   Ht '5\' 5"'$  (1.651 m)   Wt 194 lb 3.2 oz (88.1 kg)   LMP 07/31/2021 (Exact Date)   SpO2 96%   BMI 32.32 kg/m?  ?General: Well Developed, well nourished, and in no acute distress.  ? ?MSK: L-spine: Nontender to midline. ?Normal lumbar motion. ?Lower extremity strength is intact except noted below. ? ?Right hip: Normal-appearing ?Range of motion Limited flexion and internal rotation with pain felt in anterior hip. ?Strength abduction 4/5 external rotation 5-/5 ?Hip flexion strength 4+/5 with mild pain. ?Nontender ? ?Left hip normal-appearing normal motion normal strength ?Nontender ? ? ? ?Lab and Radiology Results ? ?X-ray images right hip and L-spine obtained today and today personally and independently interpreted ? ?Right hip: Superior acetabular spur present.  No acute fractures.  No severe DJD.  No AVN. ? ?L-spine: Question transitional sacral vertebrae possible L6 present.  No acute fractures present. ? ?Await formal radiology review ? ? ? ?Assessment and Plan: ?46 y.o. female with right anterior to lateral hip to  anterior thigh pain.  Pain thought less likely due to intra-articular cause of hip pain most likely due to femoral acetabular impingement or labrum tear.  Plan for trial of PT as there is a chance that this is hip flexor strain or tendinitis.  If not improved with PT likely will proceed to MRI arthrogram.  Recheck back in about 6 weeks. ? ? ?PDMP not reviewed this encounter. ?Orders Placed This Encounter  ?Procedures  ? DG Lumbar Spine 2-3 Views  ?  Standing Status:   Future  ?  Number of Occurrences:   1  ?  Standing Expiration Date:   09/08/2021  ?  Order Specific Question:   Reason for Exam (SYMPTOM  OR DIAGNOSIS REQUIRED)  ?  Answer:   low back pain  ?  Order Specific Question:   Is patient pregnant?  ?  Answer:   No  ?  Order Specific Question:   Preferred imaging location?  ?  Answer:   [facility]  ? DG HIP UNILAT W OR W/O PELVIS 2-3 VIEWS RIGHT  ?  Standing Status:   Future  ?  Number of Occurrences:   1  ?  Standing Expiration Date:   09/08/2021  ?  Order Specific Question:   Reason for Exam (SYMPTOM  OR DIAGNOSIS REQUIRED)  ?  Answer:   R hip pain  ?  Order Specific Question:   Is patient pregnant?  ?  Answer:   No  ?  Order Specific Question:   Preferred  imaging location?  ?  Answer:   [facility]  ? Ambulatory referral to Physical Therapy  ?  Referral Priority:   Routine  ?  Referral Type:   Physical Medicine  ?  Referral Reason:   Specialty Services Required  ?  Requested Specialty:   Physical Therapy  ?  Number of Visits Requested:   1  ? ?No orders of the defined types were placed in this encounter. ? ? ? ?Discussed warning signs or symptoms. Please see discharge instructions. Patient expresses understanding. ? ? ?The above documentation has been reviewed and is accurate and complete [name], M.D. ? ? ?

## 2021-08-10 ENCOUNTER — Ambulatory Visit: Payer: BC Managed Care – PPO | Admitting: Family Medicine

## 2021-08-11 ENCOUNTER — Ambulatory Visit (INDEPENDENT_AMBULATORY_CARE_PROVIDER_SITE_OTHER): Payer: BC Managed Care – PPO | Admitting: Physical Therapy

## 2021-08-11 ENCOUNTER — Encounter: Payer: Self-pay | Admitting: Physical Therapy

## 2021-08-11 DIAGNOSIS — M25551 Pain in right hip: Secondary | ICD-10-CM | POA: Diagnosis not present

## 2021-08-11 DIAGNOSIS — G8929 Other chronic pain: Secondary | ICD-10-CM | POA: Diagnosis not present

## 2021-08-11 DIAGNOSIS — M545 Low back pain, unspecified: Secondary | ICD-10-CM | POA: Diagnosis not present

## 2021-08-11 NOTE — Progress Notes (Signed)
Lumbar spine x-ray shows mild arthritis changes.

## 2021-08-11 NOTE — Progress Notes (Signed)
Right hip x-ray looks normal to radiology

## 2021-08-11 NOTE — Therapy (Signed)
?OUTPATIENT PHYSICAL THERAPY THORACOLUMBAR EVALUATION ? ? ?Patient Name: Shirley Fleming ?MRN: [id number] ?DOB:1975/07/17, 46 y.o., female ?Today's Date: 08/11/2021 ? ? PT End of Session - 08/11/21 1209   ? ? Visit Number 1   ? Number of Visits 16   ? Date for PT Re-Evaluation 10/06/21   ? Authorization Type BCBS   ? PT Start Time 1216   ? PT Stop Time 1258   ? PT Time Calculation (min) 42 min   ? Activity Tolerance Patient tolerated treatment well   ? Behavior During Therapy [facility] for tasks assessed/performed   ? ?  ?  ? ?  ? ? ?Past Medical History:  ?Diagnosis Date  ? Allergy   ? Anxiety   ? Asthma   ? History of COVID-19 12/23/2020  ? Hypoglycemia   ? Migraine with aura   ? ?Past Surgical History:  ?Procedure Laterality Date  ? APPENDECTOMY    ? TONSILLECTOMY AND ADENOIDECTOMY    ? ?Patient Active Problem List  ? Diagnosis Date Noted  ? Vitamin D deficiency 05/24/2020  ? Primary hypertension 05/24/2020  ? Migraine with aura and without status migrainosus, not intractable 05/24/2020  ? Anxiety 05/24/2020  ? ? ?PCP: [name], PA ? ?REFERRING PROVIDER: Gregor Hams, MD ? ?REFERRING DIAG: R hip pain, low back pain ? ?THERAPY DIAG:  ?Chronic bilateral low back pain without sciatica ? ?Pain in right hip ? ?ONSET DATE: 2 years ago ? ?SUBJECTIVE:                                                                                                                                                                                          ? ?SUBJECTIVE STATEMENT: ? ?Pt states long standing low back pain , thinks it started with child birth with son, and also had sacral fracture with delivery. Back pain has been on/off for years.  ?More recently, she has had increased pain in R hip. Reports mild tingling into R thigh at times.  ?Increased pain: walking, Less pain with standing and jogging. Has some pain at rest with sitting. She has been able to increase exercise recently, but with increased pain in hip. Running up to 2-3 miles,  struggles with endurance/breathing. She is also working out with trainer at [employer].  ?Pt works part time, Standing, Architectural technologist.  ? ?PERTINENT HISTORY:  ?None ? ?PAIN:  Has had pain daily, only down to 4/10 with a more restful day.  ?Are you having pain? Yes: NPRS scale: up to 6-7 /10 ?Pain location: R anterior hip ?Pain description: achey ?Aggravating factors: walking, increased activity, running,  ?Relieving factors: rest,  ice ? ? ?PRECAUTIONS: None ? ?WEIGHT BEARING RESTRICTIONS No ? ?FALLS:  ?Has patient fallen in last 6 months? No ? ? ?PLOF: Independent ? ?PATIENT GOALS : Decreased pain in hip, continue exercise with less pain.  ? ? ?OBJECTIVE:  ? ?DIAGNOSTIC FINDINGS:  ?Neg X-ray for hip.  ? ? ?COGNITION: ? Overall cognitive status: Within functional limits for tasks assessed  ? ?POSTURE:  ?unremarkable ? ?PALPATION: ?Minimal pain with palpation of groin line, anterior hip, hip flexor or glute. Mild tenderness in R SI,  Minimal pain in l-spine with PA or palpation.  ? ? ?LUMBAR ROM:  ?Hip ROM: WNL ?Lumbar: WNL, some hypermobility ? ?Active  A/PROM  ?08/11/2021  ?Flexion WNL  ?Extension WNL  ?Right lateral flexion WNL  ?Left lateral flexion WNL  ?Right rotation   ?Left rotation   ? (Blank rows = not tested) ? ? ?LE MMT: ? ?MMT Right ?08/11/2021 Left ?08/11/2021  ?Hip flexion 4 4  ?Hip extension    ?Hip abduction 4 4  ?Hip adduction    ?Hip internal rotation    ?Hip external rotation 4 4  ?Knee flexion 5 5  ?Knee extension 5 5  ?Ankle dorsiflexion    ?Ankle plantarflexion    ?Ankle inversion    ?Ankle eversion    ? (Blank rows = not tested) ? ?LUMBAR SPECIAL TESTS:  ?Increased pain with FADIR, FABER, Pain with end range hip flexion  ? ? ?TODAY'S TREATMENT  ? Eval: Ther ex: see below for HEP ? ? ?PATIENT EDUCATION:  ?Education details: PT POC, Exam findings, HEP, activities/motions  to avoid for hip pain.  ?Person educated: Patient ?Education method: Explanation, Demonstration, Tactile  cues, Verbal cues, and Handouts ?Education comprehension: verbalized understanding, returned demonstration, verbal cues required, tactile cues required, and needs further education ? ? ?HOME EXERCISE PROGRAM: ?Access Code: [email or web address] ?URL: [email or web address] ?Date: 08/11/2021 ?Prepared by: Lyndee Hensen ? ?Exercises ?- Supine Bridge  - 1 x daily - 2 sets - 10 reps ?- Sidelying Hip Abduction  - 1 x daily - 2 sets - 10 reps ?- Hooklying Clamshell with Resistance  - 1 x daily - 2 sets - 10 reps ?- Cat Cow  - 1 x daily - 1 sets - 10 reps - 5 hold ? ?ASSESSMENT: ? ?CLINICAL IMPRESSION: ?Pt presents with primary complaint of increased pain in R hip. She also has pain in low back. She has most pain in anterior hip, reproduced with movement, minimal soreness with surrounding musculature. She has symptoms consistent with possible labral pathology. She does have weakness and instability in hips and core, and will benefit from education on effective HEP for this. Pt with decreased ability for full functional activities, exercise and community activities,  due to pain and dysfunction. She will benefit from skilled PT to improve.  ? ? ?OBJECTIVE IMPAIRMENTS decreased activity tolerance, decreased balance, decreased endurance, decreased knowledge of use of DME, decreased strength, increased muscle spasms, improper body mechanics, and pain.  ? ?ACTIVITY LIMITATIONS cleaning, community activity, meal prep, occupation, laundry, yard work, and shopping.  ? ?PERSONAL FACTORS  none  are also affecting patient's functional outcome.  ? ? ?REHAB POTENTIAL: Good ? ?CLINICAL DECISION MAKING: Stable/uncomplicated ? ?EVALUATION COMPLEXITY: Low ? ? ?GOALS: ?Goals reviewed with patient? Yes ? ?SHORT TERM GOALS: Target date: 08/25/2021 ? ?Pt to be independent with initial HEP ? ?Goal status: INITIAL ? ? ? ?LONG TERM GOALS: Target date: 10/06/2021 ? ?Pt to be independent with final HEP ? ?Goal status: INITIAL ? ?  2. Pt to report  decreased pain in R hip to 0-2/10 with standing and walking activity, up to 1 hr.  ? ?Goal status: INITIAL ? ?3.  Pt to report decreased pain in R hip, to 0-2/10 with exercise/strengthening class for at least 45 min.  ? ?Goal status: INITIAL ? ?4.  Pt to demo improved hip strength to at least 4+/5 mmt, and WFL for standing and SL activity.  ? ?Goal status: INITIAL ? ? ? ?PLAN: ?PT FREQUENCY: 1-2x/week ? ?PT DURATION: 8 weeks ? ?PLANNED INTERVENTIONS: Therapeutic exercises, Therapeutic activity, Neuromuscular re-education, Balance training, Gait training, Patient/Family education, Joint manipulation, Joint mobilization, DME instructions, Dry Needling, Electrical stimulation, Spinal manipulation, Spinal mobilization, Cryotherapy, Moist heat, Taping, Traction, Ultrasound, Ionotophoresis '4mg'$ /ml Dexamethasone, and Manual therapy. ? ?PLAN FOR NEXT SESSION: Core and hip strength and stabilization  ? ?Lyndee Hensen, PT, DPT ?1:32 PM  08/11/21 ? ? ?

## 2021-08-15 ENCOUNTER — Other Ambulatory Visit (HOSPITAL_BASED_OUTPATIENT_CLINIC_OR_DEPARTMENT_OTHER): Payer: Self-pay

## 2021-08-16 NOTE — Progress Notes (Signed)
GYNECOLOGY  VISIT ?  ?HPI: ?46 y.o.   Married  Caucasian  female   ?[id number] with Patient's last menstrual period was 07/31/2021 (exact date).   ?here for pelvic ultrasound and EMB ? ?Here due to irregular menses since getting Covid in September.  ?Bleeding every 2 - 4 weeks and in between.  ? ?She is on Micronor, which does help to control her bleeding.  ? ?Has appointment for genetic counseling due to family history of breast cancer.  ?She would like to complete this first before making any change in her GYN care for bleeding.  ? ?GYNECOLOGIC HISTORY: ?Patient's last menstrual period was 07/31/2021 (exact date). ?Contraception:  VAS/POP ?Menopausal hormone therapy:  none ?Last mammogram:   08-03-20 Neg/BiRads1--Pt. Knows to schedule ?Last pap smear:   07-08-19 Neg:Neg HR HPV, 07-07-18 Neg ?       ?OB History   ? ? Gravida  ?2  ? Para  ?2  ? Term  ?0  ? Preterm  ?0  ? AB  ?0  ? Living  ?2  ?  ? ? SAB  ?0  ? IAB  ?0  ? Ectopic  ?0  ? Multiple  ?0  ? Live Births  ?0  ?   ?  ?  ?    ? ?Patient Active Problem List  ? Diagnosis Date Noted  ? Vitamin D deficiency 05/24/2020  ? Primary hypertension 05/24/2020  ? Migraine with aura and without status migrainosus, not intractable 05/24/2020  ? Anxiety 05/24/2020  ? ? ?Past Medical History:  ?Diagnosis Date  ? Allergy   ? Anxiety   ? Asthma   ? History of COVID-19 12/23/2020  ? Hypoglycemia   ? Migraine with aura   ? ? ?Past Surgical History:  ?Procedure Laterality Date  ? APPENDECTOMY    ? TONSILLECTOMY AND ADENOIDECTOMY    ? ? ?Current Outpatient Medications  ?Medication Sig Dispense Refill  ? albuterol (VENTOLIN HFA) 108 (90 Base) MCG/ACT inhaler Inhale into the lungs every 6 (six) hours as needed.    ? ALPRAZolam (XANAX) 0.25 MG tablet Take 1 tablet (0.25 mg total) by mouth every 8 (eight) hours as needed. 30 tablet 2  ? B Complex-Biotin-FA (B-COMPLEX PO) Take 0.5 tablets by mouth daily in the afternoon.    ? cholecalciferol (VITAMIN D3) 25 MCG (1000 UT) tablet Take 1,000  Units by mouth daily.    ? ferrous sulfate 324 MG TBEC Take 65 mg by mouth.    ? FLUoxetine (PROZAC) 10 MG capsule Take 1 capsule (10 mg total) by mouth daily. 90 capsule 0  ? fluticasone (FLONASE) 50 MCG/ACT nasal spray Place 1 spray into both nostrils daily.    ? magnesium 30 MG tablet Take 30 mg by mouth daily.    ? Multiple Vitamin (MULTIVITAMIN) tablet Take 1 tablet by mouth daily.    ? norethindrone (MICRONOR) 0.35 MG tablet Take 1 tablet (0.35 mg total) by mouth daily. 84 tablet 3  ? propranolol (INDERAL) 10 MG tablet TAKE 1 TABLET (10 MG TOTAL) BY MOUTH EVERY 12 (TWELVE) HOURS AS NEEDED. 60 tablet 2  ? ?No current facility-administered medications for this visit.  ?  ? ?ALLERGIES: Doxycycline and Erythromycin ? ?Family History  ?Problem Relation Age of Onset  ? Breast cancer Maternal Aunt   ? Diabetes Maternal Aunt   ? Hypertension Maternal Aunt   ? Breast cancer Maternal Grandmother   ? Breast cancer Paternal Grandmother   ? COPD Mother   ? Depression  Mother   ? Heart attack Father 24  ? Hypertension Father   ? Heart attack Maternal Grandfather   ? Heart attack Paternal Grandfather   ? ? ?Social History  ? ?Socioeconomic History  ? Marital status: Married  ?  Spouse name: Not on file  ? Number of children: Not on file  ? Years of education: Not on file  ? Highest education level: Not on file  ?Occupational History  ? Not on file  ?Tobacco Use  ? Smoking status: Former  ?  Types: Cigarettes  ?  Quit date: 04/24/2005  ?  Years since quitting: 16.3  ?  Passive exposure: Never  ? Smokeless tobacco: Never  ?Vaping Use  ? Vaping Use: Never used  ?Substance and Sexual Activity  ? Alcohol use: Yes  ?  Alcohol/week: 2.0 - 3.0 standard drinks  ?  Types: 2 - 3 Glasses of wine per week  ? Drug use: Never  ? Sexual activity: Yes  ?  Birth control/protection: Pill, Surgical  ?  Comment: Micronor/vasectomy  ?Other Topics Concern  ? Not on file  ?Social History Narrative  ? Married  ? Two children   ? 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 From [place] -- June 2019  ? ?Social Determinants of Health  ? ?Financial Resource Strain: Not on file  ?Food Insecurity: Not on file  ?Transportation Needs: Not on file  ?Physical Activity: Not on file  ?Stress: Not on file  ?Social Connections: Not on file  ?Intimate Partner Violence: Not on file  ? ? ?Review of Systems  See HPI. ? ?PHYSICAL EXAMINATION:   ? ?BP 124/80 (BP Location: Right Arm, Patient Position: Sitting, Cuff Size: Normal)   Ht '5\' 5"'$  (1.651 m)   Wt 192 lb (87.1 kg)   LMP 07/31/2021 (Exact Date)   BMI 31.95 kg/m?     ?General appearance: alert, cooperative and appears stated age ? ?Pelvic US  ?Uterus 9.79 x 6.29 x 4.99 mm.  Heterogeneous myometrium consistent with possible adenomyosis.  ?EMS 4.88 mm.  Symmetrical.  No masses.  ?Left ovary 4.07 x 1.99 x 1.71 cm.  Left ovarian complex follicle 4.23 cm ?Right ovary 4.41 x 2.25 x 1.97 cm.  ?Trace fluid in left adnexa and cul de sac.  ? ?EMB ?Consent done.  ?Hibiclens prep. ?Paracervical block with 10 cc 1% lidocaine.  ?EMB to 7 cm.  ?Pipelle passed x 2.  ?Tissue to pathology.  ?No complications.  ?Minimal EBL. ?Chaperone was present for exam:  Wandra Scot, CMA ? ?ASSESSMENT ? ?Abnormal uterine bleeding.  Possible adenomyosis.  ?Vasectomy for pregnancy prevention.  ?Possible migraine with aura. ?FH breast cancer, maternal and paternal sides of the family.   ? ?PLAN ? ?[place] report and images reviewed.  ?Adenomyosis discussed.  ?EMB performed.  ?Continue Micronor.  ?Mirena IUD and hysterectomy mentioned. ?Complete genetic counseling and possible testing.  ?  ?An After Visit Summary was printed and given to the patient. ? ?20 min  total time was spent for this patient encounter, including preparation, face-to-face counseling with the patient, coordination of care, and documentation of the encounter. ? ?  ? ?

## 2021-08-17 ENCOUNTER — Encounter: Payer: Self-pay | Admitting: Physical Therapy

## 2021-08-17 ENCOUNTER — Ambulatory Visit (INDEPENDENT_AMBULATORY_CARE_PROVIDER_SITE_OTHER): Payer: BC Managed Care – PPO | Admitting: Physical Therapy

## 2021-08-17 DIAGNOSIS — M25551 Pain in right hip: Secondary | ICD-10-CM

## 2021-08-17 DIAGNOSIS — M545 Low back pain, unspecified: Secondary | ICD-10-CM | POA: Diagnosis not present

## 2021-08-17 DIAGNOSIS — G8929 Other chronic pain: Secondary | ICD-10-CM

## 2021-08-17 NOTE — Therapy (Signed)
?OUTPATIENT PHYSICAL THERAPY TREATMENT ? ? ?Patient Name: Shirley Fleming ?MRN: [id number] ?DOB:03-11-1976, 46 y.o., female ?Today's Date: 08/17/2021 ? ? PT End of Session - 08/17/21 0850   ? ? Visit Number 2   ? Number of Visits 16   ? Date for PT Re-Evaluation 10/06/21   ? Authorization Type BCBS   ? PT Start Time [phone]   ? PT Stop Time 0930   ? PT Time Calculation (min) 40 min   ? Activity Tolerance Patient tolerated treatment well   ? Behavior During Therapy [facility] for tasks assessed/performed   ? ?  ?  ? ?  ? ? ?Past Medical History:  ?Diagnosis Date  ? Allergy   ? Anxiety   ? Asthma   ? History of COVID-19 12/23/2020  ? Hypoglycemia   ? Migraine with aura   ? ?Past Surgical History:  ?Procedure Laterality Date  ? APPENDECTOMY    ? TONSILLECTOMY AND ADENOIDECTOMY    ? ?Patient Active Problem List  ? Diagnosis Date Noted  ? Vitamin D deficiency 05/24/2020  ? Primary hypertension 05/24/2020  ? Migraine with aura and without status migrainosus, not intractable 05/24/2020  ? Anxiety 05/24/2020  ? ? ?PCP: [name], PA ? ?REFERRING PROVIDER: [name], PA ? ?REFERRING DIAG: R hip pain, low back pain ? ?THERAPY DIAG:  ?Chronic bilateral low back pain without sciatica ? ?Pain in right hip ? ?ONSET DATE: 2 years ago ? ?SUBJECTIVE:                                                                                                                                                                                          ? ?SUBJECTIVE STATEMENT: ? Pt with no new complaints today.  ? ? ?PERTINENT HISTORY:  ?None ? ?PAIN:  Has had pain daily, only down to 4/10 with a more restful day.  ?Are you having pain? Yes: NPRS scale: up to 6-7 /10 ?Pain location: R anterior hip ?Pain description: achey ?Aggravating factors: walking, increased activity, running,  ?Relieving factors: rest, ice ? ? ?PRECAUTIONS: None ? ?WEIGHT BEARING RESTRICTIONS No ? ?FALLS:  ?Has patient fallen in last 6 months? No ? ? ?PLOF: Independent ? ?PATIENT  GOALS : Decreased pain in hip, continue exercise with less pain.  ? ? ?OBJECTIVE:  ? ?DIAGNOSTIC FINDINGS:  ?Neg X-ray for hip.  ? ? ?COGNITION: ? Overall cognitive status: Within functional limits for tasks assessed  ? ?POSTURE:  ?unremarkable ? ?PALPATION: ?Minimal pain with palpation of groin line, anterior hip, hip flexor or glute. Mild tenderness in R SI,  Minimal pain in l-spine with PA or palpation.  ? ? ?  LUMBAR ROM:  ?Hip ROM: WNL ?Lumbar: WNL, some hypermobility ? ?Active  A/PROM  ?08/11/2021  ?Flexion WNL  ?Extension WNL  ?Right lateral flexion WNL  ?Left lateral flexion WNL  ?Right rotation   ?Left rotation   ? (Blank rows = not tested) ? ? ?LE MMT: ? ?MMT Right ?08/11/2021 Left ?08/11/2021  ?Hip flexion 4 4  ?Hip extension    ?Hip abduction 4 4  ?Hip adduction    ?Hip internal rotation    ?Hip external rotation 4 4  ?Knee flexion 5 5  ?Knee extension 5 5  ?Ankle dorsiflexion    ?Ankle plantarflexion    ?Ankle inversion    ?Ankle eversion    ? (Blank rows = not tested) ? ?LUMBAR SPECIAL TESTS:  ?Increased pain with FADIR, FABER, Pain with end range hip flexion  ? ? ?TODAY'S TREATMENT  ? Eval: Ther ex: see below for HEP ? ? ?PATIENT EDUCATION: updated and reviewed HEP ?Person educated: Patient ?Education method: Explanation, Demonstration, Tactile cues, Verbal cues, and Handouts ?Education comprehension: verbalized understanding, returned demonstration, verbal cues required, tactile cues required, and needs further education ? ? ?HOME EXERCISE PROGRAM: ?Access Code: [email or web address] ? ? ?ASSESSMENT: ? ?CLINICAL IMPRESSION: ?Pt with good ability for ther ex and strengthening today. Pt with improved mechanics after Education and practice on form with squats and LE strength. She will benefit from ongoing strength and stabilization for hip pain.  ? ? ?OBJECTIVE IMPAIRMENTS decreased activity tolerance, decreased balance, decreased endurance, decreased knowledge of use of DME, decreased strength, increased muscle spasms,  improper body mechanics, and pain.  ? ?ACTIVITY LIMITATIONS cleaning, community activity, meal prep, occupation, laundry, yard work, and shopping.  ? ?PERSONAL FACTORS  none  are also affecting patient's functional outcome.  ? ? ?REHAB POTENTIAL: Good ? ?CLINICAL DECISION MAKING: Stable/uncomplicated ? ?EVALUATION COMPLEXITY: Low ? ? ?GOALS: ?Goals reviewed with patient? Yes ? ?SHORT TERM GOALS: Target date: 08/31/2021 ? ?Pt to be independent with initial HEP ? ?Goal status: INITIAL ? ? ? ?LONG TERM GOALS: Target date: 10/12/2021 ? ?Pt to be independent with final HEP ? ?Goal status: INITIAL ? ?2. Pt to report decreased pain in R hip to 0-2/10 with standing and walking activity, up to 1 hr.  ? ?Goal status: INITIAL ? ?3.  Pt to report decreased pain in R hip, to 0-2/10 with exercise/strengthening class for at least 45 min.  ? ?Goal status: INITIAL ? ?4.  Pt to demo improved hip strength to at least 4+/5 mmt, and WFL for standing and SL activity.  ? ?Goal status: INITIAL ? ? ? ?PLAN: ?PT FREQUENCY: 1-2x/week ? ?PT DURATION: 8 weeks ? ?PLANNED INTERVENTIONS: Therapeutic exercises, Therapeutic activity, Neuromuscular re-education, Balance training, Gait training, Patient/Family education, Joint manipulation, Joint mobilization, DME instructions, Dry Needling, Electrical stimulation, Spinal manipulation, Spinal mobilization, Cryotherapy, Moist heat, Taping, Traction, Ultrasound, Ionotophoresis '4mg'$ /ml Dexamethasone, and Manual therapy. ? ?PLAN FOR NEXT SESSION: Core and hip strength and stabilization  ? ?Lyndee Hensen, PT, DPT ?9:00 PM  08/17/21 ? ? ?

## 2021-08-22 ENCOUNTER — Ambulatory Visit (INDEPENDENT_AMBULATORY_CARE_PROVIDER_SITE_OTHER): Payer: BC Managed Care – PPO | Admitting: Physical Therapy

## 2021-08-22 ENCOUNTER — Encounter: Payer: Self-pay | Admitting: Physical Therapy

## 2021-08-22 ENCOUNTER — Other Ambulatory Visit: Payer: Self-pay | Admitting: Obstetrics and Gynecology

## 2021-08-22 DIAGNOSIS — M545 Low back pain, unspecified: Secondary | ICD-10-CM

## 2021-08-22 DIAGNOSIS — M25551 Pain in right hip: Secondary | ICD-10-CM

## 2021-08-22 DIAGNOSIS — G8929 Other chronic pain: Secondary | ICD-10-CM

## 2021-08-22 NOTE — Therapy (Signed)
?OUTPATIENT PHYSICAL THERAPY TREATMENT ? ? ?Patient Name: Shirley Fleming ?MRN: [id number] ?DOB:10/06/1975, 46 y.o., female ?Today's Date: 08/22/2021 ? ? PT End of Session - 08/22/21 7858   ? ? Visit Number 3   ? Number of Visits 16   ? Date for PT Re-Evaluation 10/06/21   ? Authorization Type BCBS   ? PT Start Time 0805   ? PT Stop Time 0845   ? PT Time Calculation (min) 40 min   ? Activity Tolerance Patient tolerated treatment well   ? Behavior During Therapy [facility] for tasks assessed/performed   ? ?  ?  ? ?  ? ? ?Past Medical History:  ?Diagnosis Date  ? Allergy   ? Anxiety   ? Asthma   ? History of COVID-19 12/23/2020  ? Hypoglycemia   ? Migraine with aura   ? ?Past Surgical History:  ?Procedure Laterality Date  ? APPENDECTOMY    ? TONSILLECTOMY AND ADENOIDECTOMY    ? ?Patient Active Problem List  ? Diagnosis Date Noted  ? Vitamin D deficiency 05/24/2020  ? Primary hypertension 05/24/2020  ? Migraine with aura and without status migrainosus, not intractable 05/24/2020  ? Anxiety 05/24/2020  ? ? ?PCP: [name], PA ? ?REFERRING PROVIDER: [name], PA ? ?REFERRING DIAG: R hip pain, low back pain ? ?THERAPY DIAG:  ?Chronic bilateral low back pain without sciatica ? ?Pain in right hip ? ?ONSET DATE: 2 years ago ? ?SUBJECTIVE:                                                                                                                                                                                          ? ?SUBJECTIVE STATEMENT: ? Pt with no new complaints today.  ? ? ?PERTINENT HISTORY:  ?None ? ?PAIN:  Has had pain daily, only down to 4/10 with a more restful day.  ?Are you having pain? Yes: NPRS scale: up to 6-7 /10 ?Pain location: R anterior hip ?Pain description: achey ?Aggravating factors: walking, increased activity, running,  ?Relieving factors: rest, ice ? ? ?PRECAUTIONS: None ? ?WEIGHT BEARING RESTRICTIONS No ? ?FALLS:  ?Has patient fallen in last 6 months? No ? ? ?PLOF: Independent ? ?PATIENT  GOALS : Decreased pain in hip, continue exercise with less pain.  ? ? ?OBJECTIVE:  ? ?DIAGNOSTIC FINDINGS:  ?Neg X-ray for hip.  ? ? ?COGNITION: ? Overall cognitive status: Within functional limits for tasks assessed  ? ?POSTURE:  ?unremarkable ? ?PALPATION: ?Minimal pain with palpation of groin line, anterior hip, hip flexor or glute. Mild tenderness in R SI,  Minimal pain in l-spine with PA or palpation.  ? ? ?  LUMBAR ROM:  ?Hip ROM: WNL ?Lumbar: WNL, some hypermobility ? ?Active  A/PROM  ?08/11/2021  ?Flexion WNL  ?Extension WNL  ?Right lateral flexion WNL  ?Left lateral flexion WNL  ?Right rotation   ?Left rotation   ? (Blank rows = not tested) ? ? ?LE MMT: ? ?MMT Right ?08/11/2021 Left ?08/11/2021  ?Hip flexion 4 4  ?Hip extension    ?Hip abduction 4 4  ?Hip adduction    ?Hip internal rotation    ?Hip external rotation 4 4  ?Knee flexion 5 5  ?Knee extension 5 5  ?Ankle dorsiflexion    ?Ankle plantarflexion    ?Ankle inversion    ?Ankle eversion    ? (Blank rows = not tested) ? ?LUMBAR SPECIAL TESTS:  ?Increased pain with FADIR, FABER, Pain with end range hip flexion  ? ? ?TODAY'S TREATMENT  ?  ?Therapeutic Exercise: ?Aerobic: Bike L2 x 8 min  ?Supine: Bridging with ball squeeze x 20; Side planks 2 x 30 sec bil; High plank 30 sec x 2;  SLR 2x10 bil with TA;  ?Seated: Sit to stand from higher mat table x 15 with 10lb weight;  ?Standing: Band walks Blue TB x 20;  Walk/March, fwd/bwd x 2 ea;  ?Stretches:  Discussed light stretching as needed, and not over stretching hip.  ?Neuromuscular Re-education: ?Manual Therapy: ?Self Care: ? ? ? ?PATIENT EDUCATION: updated and reviewed HEP ?Person educated: Patient ?Education method: Explanation, Demonstration, Tactile cues, Verbal cues, and Handouts ?Education comprehension: verbalized understanding, returned demonstration, verbal cues required, tactile cues required, and needs further education ? ? ?HOME EXERCISE PROGRAM: ?Access Code: [email or web address] ?Updated   08/22/21 ? ? ?ASSESSMENT: ? ?CLINICAL IMPRESSION: ?Pt with good ability for ther ex and strengthening today. Improved ability and awareness for TA with activity. Minimal pain with activities today. Pt to benefit from continued strengthening and stabilization.  ? ?OBJECTIVE IMPAIRMENTS decreased activity tolerance, decreased balance, decreased endurance, decreased knowledge of use of DME, decreased strength, increased muscle spasms, improper body mechanics, and pain.  ? ?ACTIVITY LIMITATIONS cleaning, community activity, meal prep, occupation, laundry, yard work, and shopping.  ? ?PERSONAL FACTORS  none  are also affecting patient's functional outcome.  ? ? ?REHAB POTENTIAL: Good ? ?CLINICAL DECISION MAKING: Stable/uncomplicated ? ?EVALUATION COMPLEXITY: Low ? ? ?GOALS: ?Goals reviewed with patient? Yes ? ?SHORT TERM GOALS: Target date: 09/05/2021 ? ?Pt to be independent with initial HEP ? ?Goal status: INITIAL ? ? ? ?LONG TERM GOALS: Target date: 10/17/2021 ? ?Pt to be independent with final HEP ? ?Goal status: INITIAL ? ?2. Pt to report decreased pain in R hip to 0-2/10 with standing and walking activity, up to 1 hr.  ? ?Goal status: INITIAL ? ?3.  Pt to report decreased pain in R hip, to 0-2/10 with exercise/strengthening class for at least 45 min.  ? ?Goal status: INITIAL ? ?4.  Pt to demo improved hip strength to at least 4+/5 mmt, and WFL for standing and SL activity.  ? ?Goal status: INITIAL ? ? ? ?PLAN: ?PT FREQUENCY: 1-2x/week ? ?PT DURATION: 8 weeks ? ?PLANNED INTERVENTIONS: Therapeutic exercises, Therapeutic activity, Neuromuscular re-education, Balance training, Gait training, Patient/Family education, Joint manipulation, Joint mobilization, DME instructions, Dry Needling, Electrical stimulation, Spinal manipulation, Spinal mobilization, Cryotherapy, Moist heat, Taping, Traction, Ultrasound, Ionotophoresis '4mg'$ /ml Dexamethasone, and Manual therapy. ? ?PLAN FOR NEXT SESSION: Core and hip strength and  stabilization  ? ?Lyndee Hensen, PT, DPT ?11:30 AM  08/22/21 ? ? ?

## 2021-08-23 ENCOUNTER — Encounter: Payer: Self-pay | Admitting: Obstetrics and Gynecology

## 2021-08-23 ENCOUNTER — Other Ambulatory Visit (HOSPITAL_COMMUNITY)
Admission: RE | Admit: 2021-08-23 | Discharge: 2021-08-23 | Disposition: A | Discharge status: Home / Self Care | Payer: BC Managed Care – PPO | Source: Ambulatory Visit | Attending: Obstetrics and Gynecology | Admitting: Obstetrics and Gynecology

## 2021-08-23 ENCOUNTER — Ambulatory Visit (INDEPENDENT_AMBULATORY_CARE_PROVIDER_SITE_OTHER): Payer: BC Managed Care – PPO | Admitting: Obstetrics and Gynecology

## 2021-08-23 ENCOUNTER — Ambulatory Visit: Payer: BC Managed Care – PPO

## 2021-08-23 VITALS — BP 124/80 | Ht 65.0 in | Wt 192.0 lb

## 2021-08-23 DIAGNOSIS — N939 Abnormal uterine and vaginal bleeding, unspecified: Secondary | ICD-10-CM

## 2021-08-23 DIAGNOSIS — N840 Polyp of corpus uteri: Secondary | ICD-10-CM | POA: Diagnosis not present

## 2021-08-23 DIAGNOSIS — N841 Polyp of cervix uteri: Secondary | ICD-10-CM | POA: Diagnosis not present

## 2021-08-23 NOTE — Patient Instructions (Signed)
Endometrial Biopsy  An endometrial biopsy is a procedure to remove tissue samples from the endometrium, which is the lining of the uterus. The tissue that is removed can then be checked under a microscope for disease. This procedure is used to diagnose conditions such as endometrial cancer, endometrial tuberculosis, polyps, or other inflammatory conditions. This procedure may also be used to investigate uterine bleeding to determine where you are in your menstrual cycle or how your hormone levels are affecting the lining of the uterus. Tell a health care provider about: Any allergies you have. All medicines you are taking, including vitamins, herbs, eye drops, creams, and over-the-counter medicines. Any problems you or family members have had with anesthetic medicines. Any blood disorders you have. Any surgeries you have had. Any medical conditions you have. Whether you are pregnant or may be pregnant. What are the risks? Generally, this is a safe procedure. However, problems may occur, including: Bleeding. Pelvic infection. Puncture of the wall of the uterus with the biopsy device (rare). Allergic reactions to medicines. What happens before the procedure? Keep a record of your menstrual cycles as told by your health care provider. You may need to schedule your procedure for a specific time in your cycle. You may want to bring a sanitary pad to wear after the procedure. Plan to have someone take you home from the hospital or clinic. Ask your health care provider about: Changing or stopping your regular medicines. This is especially important if you are taking diabetes medicines, arthritis medicines, or blood thinners. Taking medicines such as aspirin and ibuprofen. These medicines can thin your blood. Do not take these medicines unless your health care provider tells you to take them. Taking over-the-counter medicines, vitamins, herbs, and supplements. What happens during the  procedure? You will lie on an exam table with your feet and legs supported as in a pelvic exam. Your health care provider will insert an instrument (speculum) into your vagina to see your cervix. Your cervix will be cleansed with an antiseptic solution. A medicine (local anesthetic) will be used to numb the cervix. A forceps instrument (tenaculum) will be used to hold your cervix steady for the biopsy. A thin, rod-like instrument (uterine sound) will be inserted through your cervix to determine the length of your uterus and the location where the biopsy sample will be removed. A thin, flexible tube (catheter) will be inserted through your cervix and into the uterus. The catheter will be used to collect the biopsy sample from your endometrial tissue. The catheter and speculum will then be removed, and the tissue sample will be sent to a lab for examination. The procedure may vary among health care providers and hospitals. What can I expect after procedure? You will rest in a recovery area until you are ready to go home. You may have mild cramping and a small amount of vaginal bleeding. This is normal. You may have a small amount of vaginal bleeding for a few days. This is normal. It is up to you to get the results of your procedure. Ask your health care provider, or the department that is doing the procedure, when your results will be ready. Follow these instructions at home: Take over-the-counter and prescription medicines only as told by your health care provider. Do not douche, use tampons, or have sexual intercourse until your health care provider approves. Return to your normal activities as told by your health care provider. Ask your health care provider what activities are safe for you. Follow   instructions from your health care provider about any activity restrictions, such as restrictions on strenuous exercise or heavy lifting. Keep all follow-up visits. This is important. Contact a  health care provider: You have heavy bleeding, or bleed for longer than 2 days after the procedure. You have bad smelling discharge from your vagina. You have a fever or chills. You have a burning sensation when urinating or you have difficulty urinating. You have severe pain in your lower abdomen. Get help right away if you: You have severe cramps in your stomach or back. You pass large blood clots. Your bleeding increases. You become weak or light-headed, or you faint or lose consciousness. Summary An endometrial biopsy is a procedure to remove tissue samples is taken from the endometrium, which is the lining of the uterus. The tissue sample that is removed will be checked under a microscope for disease. This procedure is used to diagnose conditions such as endometrial cancer, endometrial tuberculosis, polyps, or other inflammatory conditions. After the procedure, it is common to have mild cramping and a small amount of vaginal bleeding for a few days. Do not douche, use tampons, or have sexual intercourse until your health care provider approves. Ask your health care provider which activities are safe for you. This information is not intended to replace advice given to you by your health care provider. Make sure you discuss any questions you have with your health care provider. Document Revised: 03/07/2021 Document Reviewed: 11/03/2019 Elsevier Patient Education  2023 [employer]  

## 2021-08-24 ENCOUNTER — Encounter: Payer: Self-pay | Admitting: Physical Therapy

## 2021-08-24 ENCOUNTER — Ambulatory Visit: Payer: BC Managed Care – PPO | Admitting: Physical Therapy

## 2021-08-24 DIAGNOSIS — M25551 Pain in right hip: Secondary | ICD-10-CM | POA: Diagnosis not present

## 2021-08-24 DIAGNOSIS — G8929 Other chronic pain: Secondary | ICD-10-CM | POA: Diagnosis not present

## 2021-08-24 DIAGNOSIS — M545 Low back pain, unspecified: Secondary | ICD-10-CM

## 2021-08-24 NOTE — Therapy (Signed)
?OUTPATIENT PHYSICAL THERAPY TREATMENT ? ? ?Patient Name: Chiyoko Torrico ?MRN: [id number] ?DOB:[birth year], 46 y.o., female ?Today's Date: 08/24/2021 ? ? PT End of Session - 08/24/21 1110   ? ? Visit Number 4   ? Number of Visits 16   ? Date for PT Re-Evaluation 10/06/21   ? Authorization Type BCBS   ? PT Start Time 1105   ? PT Stop Time 7858   ? PT Time Calculation (min) 38 min   ? Activity Tolerance Patient tolerated treatment well   ? Behavior During Therapy [facility] for tasks assessed/performed   ? ?  ?  ? ?  ? ? ? ?Past Medical History:  ?Diagnosis Date  ? Allergy   ? Anxiety   ? Asthma   ? History of COVID-19 12/23/2020  ? Hypoglycemia   ? Migraine with aura   ? ?Past Surgical History:  ?Procedure Laterality Date  ? APPENDECTOMY    ? TONSILLECTOMY AND ADENOIDECTOMY    ? ?Patient Active Problem List  ? Diagnosis Date Noted  ? Vitamin D deficiency 05/24/2020  ? Primary hypertension 05/24/2020  ? Migraine with aura and without status migrainosus, not intractable 05/24/2020  ? Anxiety 05/24/2020  ? ? ?PCP: [name], PA ? ?REFERRING PROVIDER: [name], PA ? ?REFERRING DIAG: R hip pain, low back pain ? ?THERAPY DIAG:  ?Chronic bilateral low back pain without sciatica ? ?Pain in right hip ? ?ONSET DATE: 2 years ago ? ?SUBJECTIVE:                                                                                                                                                                                          ? ?SUBJECTIVE STATEMENT: ? Pt with no new complaints today. Does feels some less soreness with just sitting. Still notes most discomfort with standing/turning, hip ER position, and catching sensations.  ? ? ?PERTINENT HISTORY:  ?None ? ?PAIN:  Has had pain daily, only down to 4/10 with a more restful day.  ?Are you having pain? Yes: NPRS scale: up to 6-7 /10 ?Pain location: R anterior hip ?Pain description: achey ?Aggravating factors: walking, increased activity, running,  ?Relieving factors: rest,  ice ? ? ?PRECAUTIONS: None ? ?WEIGHT BEARING RESTRICTIONS No ? ?FALLS:  ?Has patient fallen in last 6 months? No ? ? ?PLOF: Independent ? ?PATIENT GOALS : Decreased pain in hip, continue exercise with less pain.  ? ? ?OBJECTIVE:  ? ?DIAGNOSTIC FINDINGS:  ?Neg X-ray for hip.  ? ? ?COGNITION: ? Overall cognitive status: Within functional limits for tasks assessed  ? ?POSTURE:  ?unremarkable ? ?PALPATION: ?Minimal pain with palpation of groin line, anterior hip,  hip flexor or glute. Mild tenderness in R SI,  Minimal pain in l-spine with PA or palpation.  ? ? ?LUMBAR ROM:  ?Hip ROM: WNL ?Lumbar: WNL, some hypermobility ? ?Active  A/PROM  ?08/11/2021  ?Flexion WNL  ?Extension WNL  ?Right lateral flexion WNL  ?Left lateral flexion WNL  ?Right rotation   ?Left rotation   ? (Blank rows = not tested) ? ? ?LE MMT: ? ?MMT Right ?08/11/2021 Left ?08/11/2021  ?Hip flexion 4 4  ?Hip extension    ?Hip abduction 4 4  ?Hip adduction    ?Hip internal rotation    ?Hip external rotation 4 4  ?Knee flexion 5 5  ?Knee extension 5 5  ?Ankle dorsiflexion    ?Ankle plantarflexion    ?Ankle inversion    ?Ankle eversion    ? (Blank rows = not tested) ? ?LUMBAR SPECIAL TESTS:  ?Increased pain with FADIR, FABER, Pain with end range hip flexion  ? ? ?TODAY'S TREATMENT  ?  ?Therapeutic Exercise: ?08/24/21: ?Aerobic: Bike L2 x 8 min  ?Supine: Bridging with hip abd x 20;  SLR 2x10 bil with TA;  ?Seated: Sit to stand from reg chair x 15 with 10lb weight;  ?Standing:   Walk/March, fwd/bwd with 10lb unil weight x 2 ea; Squats 10lb x 15; SLS with opp hip ER x 10 bil (at wall) ;  ?Stretches:   ?Neuromuscular Re-education: ?Manual Therapy: ?Self Care: ? ? ? ?PATIENT EDUCATION: updated and reviewed HEP ?Person educated: Patient ?Education method: Explanation, Demonstration, Tactile cues, Verbal cues, and Handouts ?Education comprehension: verbalized understanding, returned demonstration, verbal cues required, tactile cues required, and needs further  education ? ? ?HOME EXERCISE PROGRAM: ?Access Code: [email or web address] ?Updated  08/22/21 ? ? ?ASSESSMENT: ? ?CLINICAL IMPRESSION: ?Pt with good ability for ther ex and strengthening today. No increased pain with progressive strengthening.  Pt to benefit from continued strengthening and stabilization. She still has pain with increases motions for active and passive hip flex, ER and IR in supine today.  ? ?OBJECTIVE IMPAIRMENTS decreased activity tolerance, decreased balance, decreased endurance, decreased knowledge of use of DME, decreased strength, increased muscle spasms, improper body mechanics, and pain.  ? ?ACTIVITY LIMITATIONS cleaning, community activity, meal prep, occupation, laundry, yard work, and shopping.  ? ?PERSONAL FACTORS  none  are also affecting patient's functional outcome.  ? ? ?REHAB POTENTIAL: Good ? ?CLINICAL DECISION MAKING: Stable/uncomplicated ? ?EVALUATION COMPLEXITY: Low ? ? ?GOALS: ?Goals reviewed with patient? Yes ? ?SHORT TERM GOALS: Target date: 09/07/2021 ? ?Pt to be independent with initial HEP ? ?Goal status: INITIAL ? ? ? ?LONG TERM GOALS: Target date: 10/19/2021 ? ?Pt to be independent with final HEP ? ?Goal status: INITIAL ? ?2. Pt to report decreased pain in R hip to 0-2/10 with standing and walking activity, up to 1 hr.  ? ?Goal status: INITIAL ? ?3.  Pt to report decreased pain in R hip, to 0-2/10 with exercise/strengthening class for at least 45 min.  ? ?Goal status: INITIAL ? ?4.  Pt to demo improved hip strength to at least 4+/5 mmt, and WFL for standing and SL activity.  ? ?Goal status: INITIAL ? ? ? ?PLAN: ?PT FREQUENCY: 1-2x/week ? ?PT DURATION: 8 weeks ? ?PLANNED INTERVENTIONS: Therapeutic exercises, Therapeutic activity, Neuromuscular re-education, Balance training, Gait training, Patient/Family education, Joint manipulation, Joint mobilization, DME instructions, Dry Needling, Electrical stimulation, Spinal manipulation, Spinal mobilization, Cryotherapy, Moist heat, Taping,  Traction, Ultrasound, Ionotophoresis '4mg'$ /ml Dexamethasone, and Manual therapy. ? ?PLAN FOR NEXT SESSION:  Core and hip strength and stabilization  ? ?Lyndee Hensen, PT, DPT ?12:12 PM  08/24/21 ? ? ?

## 2021-08-25 ENCOUNTER — Other Ambulatory Visit (HOSPITAL_BASED_OUTPATIENT_CLINIC_OR_DEPARTMENT_OTHER): Payer: Self-pay

## 2021-08-25 LAB — SURGICAL PATHOLOGY

## 2021-08-26 ENCOUNTER — Other Ambulatory Visit (HOSPITAL_BASED_OUTPATIENT_CLINIC_OR_DEPARTMENT_OTHER): Payer: Self-pay

## 2021-08-29 ENCOUNTER — Encounter: Payer: Self-pay | Admitting: Physical Therapy

## 2021-08-29 ENCOUNTER — Ambulatory Visit (INDEPENDENT_AMBULATORY_CARE_PROVIDER_SITE_OTHER): Payer: BC Managed Care – PPO | Admitting: Physical Therapy

## 2021-08-29 DIAGNOSIS — M545 Low back pain, unspecified: Secondary | ICD-10-CM

## 2021-08-29 DIAGNOSIS — G8929 Other chronic pain: Secondary | ICD-10-CM

## 2021-08-29 DIAGNOSIS — M25551 Pain in right hip: Secondary | ICD-10-CM | POA: Diagnosis not present

## 2021-08-29 NOTE — Therapy (Signed)
?OUTPATIENT PHYSICAL THERAPY TREATMENT ? ? ?Patient Name: Shirley Fleming ?MRN: [id number] ?DOB:1975/12/11, 46 y.o., female ?Today's Date: 08/29/2021 ? ? PT End of Session - 08/29/21 1034   ? ? Visit Number 5   ? Number of Visits 16   ? Date for PT Re-Evaluation 10/06/21   ? Authorization Type BCBS   ? PT Start Time 1027   ? PT Stop Time 1105   ? PT Time Calculation (min) 38 min   ? Activity Tolerance Patient tolerated treatment well   ? Behavior During Therapy [facility] for tasks assessed/performed   ? ?  ?  ? ?  ? ? ? ?Past Medical History:  ?Diagnosis Date  ? Allergy   ? Anxiety   ? Asthma   ? History of COVID-19 12/23/2020  ? Hypoglycemia   ? Migraine with aura   ? ?Past Surgical History:  ?Procedure Laterality Date  ? APPENDECTOMY    ? TONSILLECTOMY AND ADENOIDECTOMY    ? ?Patient Active Problem List  ? Diagnosis Date Noted  ? Vitamin D deficiency 05/24/2020  ? Primary hypertension 05/24/2020  ? Migraine with aura and without status migrainosus, not intractable 05/24/2020  ? Anxiety 05/24/2020  ? ? ?PCP: [name], PA ? ?REFERRING PROVIDER: [name], PA ? ?REFERRING DIAG: R hip pain, low back pain ? ?THERAPY DIAG:  ?Chronic bilateral low back pain without sciatica ? ?Pain in right hip ? ?ONSET DATE: 2 years ago ? ?SUBJECTIVE:                                                                                                                                                                                          ? ?SUBJECTIVE STATEMENT: ? Pt  was able to complete 5K with her daughter, minimal pain while running/walking, but did have increased general soreness R>L over the weekend following race. Mild soreness today.  ? ? ?PERTINENT HISTORY:  ?None ? ?PAIN:  Has had pain daily, only down to 4/10 with a more restful day.  ?Are you having pain? Yes: NPRS scale: up to 6-7 /10 ?Pain location: R anterior hip ?Pain description: achey ?Aggravating factors: walking, increased activity, running,  ?Relieving factors: rest,  ice ? ? ?PRECAUTIONS: None ? ?WEIGHT BEARING RESTRICTIONS No ? ?FALLS:  ?Has patient fallen in last 6 months? No ? ? ?PLOF: Independent ? ?PATIENT GOALS : Decreased pain in hip, continue exercise with less pain.  ? ? ?OBJECTIVE:  ? ?DIAGNOSTIC FINDINGS:  ?Neg X-ray for hip.  ? ? ?COGNITION: ? Overall cognitive status: Within functional limits for tasks assessed  ? ?POSTURE:  ?unremarkable ? ?PALPATION: ?Minimal pain with palpation of groin  line, anterior hip, hip flexor or glute. Mild tenderness in R SI,  Minimal pain in l-spine with PA or palpation.  ? ? ?LUMBAR ROM:  ?Hip ROM: WNL ?Lumbar: WNL, some hypermobility ? ?Active  A/PROM  ?08/11/2021  ?Flexion WNL  ?Extension WNL  ?Right lateral flexion WNL  ?Left lateral flexion WNL  ?Right rotation   ?Left rotation   ? (Blank rows = not tested) ? ? ?LE MMT: ? ?MMT Right ?08/11/2021 Left ?08/11/2021  ?Hip flexion 4 4  ?Hip extension    ?Hip abduction 4 4  ?Hip adduction    ?Hip internal rotation    ?Hip external rotation 4 4  ?Knee flexion 5 5  ?Knee extension 5 5  ?Ankle dorsiflexion    ?Ankle plantarflexion    ?Ankle inversion    ?Ankle eversion    ? (Blank rows = not tested) ? ?LUMBAR SPECIAL TESTS:  ?Increased pain with FADIR, FABER, Pain with end range hip flexion  ? ? ?TODAY'S TREATMENT  ?  ?Therapeutic Exercise: ?08/29/21: ?Aerobic: Bike L2 x 8 min  ?Supine: Bridging x 20;  Hip abd Black TB x 20 ?S/L hip abd 2x10  on R;  ?Seated:   ?Standing:   Walk/March, fwd with 10lb weight x 6 ;  ?Squats 20lb x 20;  Band walks Blue TB 10 ft x 4 (slight pain going to the R); Fwd step ups 6 in x 10 bil;  Lat step ups 6 in x 10 bil (mild pain stepping down off step on R);   ?Stretches:   ?Neuromuscular Re-education: ?Manual Therapy: ?Self Care: ? ? ? ?PATIENT EDUCATION: updated and reviewed HEP ?Person educated: Patient ?Education method: Explanation, Demonstration, Tactile cues, Verbal cues, and Handouts ?Education comprehension: verbalized understanding, returned demonstration,  verbal cues required, tactile cues required, and needs further education ? ? ?HOME EXERCISE PROGRAM: ?Access Code: [email or web address] ?Updated  08/22/21 ? ? ?ASSESSMENT: ? ?CLINICAL IMPRESSION: ?Pt with good ability for ther ex today. She does have mild soreness with attempts for functional/multi-directional strengthening in standing today. Does have less pain/no pain with activation strengthening (S/L hip abd). Pt to benefit from continued strength/stabilization as tolerated.   ? ? ?OBJECTIVE IMPAIRMENTS decreased activity tolerance, decreased balance, decreased endurance, decreased knowledge of use of DME, decreased strength, increased muscle spasms, improper body mechanics, and pain.  ? ?ACTIVITY LIMITATIONS cleaning, community activity, meal prep, occupation, laundry, yard work, and shopping.  ? ?PERSONAL FACTORS  none  are also affecting patient's functional outcome.  ? ? ?REHAB POTENTIAL: Good ? ?CLINICAL DECISION MAKING: Stable/uncomplicated ? ?EVALUATION COMPLEXITY: Low ? ? ?GOALS: ?Goals reviewed with patient? Yes ? ?SHORT TERM GOALS: Target date: 09/12/2021 ? ?Pt to be independent with initial HEP ? ?Goal status: INITIAL ? ? ? ?LONG TERM GOALS: Target date: 10/24/2021 ? ?Pt to be independent with final HEP ? ?Goal status: INITIAL ? ?2. Pt to report decreased pain in R hip to 0-2/10 with standing and walking activity, up to 1 hr.  ? ?Goal status: INITIAL ? ?3.  Pt to report decreased pain in R hip, to 0-2/10 with exercise/strengthening class for at least 45 min.  ? ?Goal status: INITIAL ? ?4.  Pt to demo improved hip strength to at least 4+/5 mmt, and WFL for standing and SL activity.  ? ?Goal status: INITIAL ? ? ? ?PLAN: ?PT FREQUENCY: 1-2x/week ? ?PT DURATION: 8 weeks ? ?PLANNED INTERVENTIONS: Therapeutic exercises, Therapeutic activity, Neuromuscular re-education, Balance training, Gait training, Patient/Family education, Joint manipulation, Joint mobilization, DME instructions,  Dry Needling, Electrical stimulation,  Spinal manipulation, Spinal mobilization, Cryotherapy, Moist heat, Taping, Traction, Ultrasound, Ionotophoresis '4mg'$ /ml Dexamethasone, and Manual therapy. ? ?PLAN FOR NEXT SESSION: Core and hip strength and stabilization  ? ?Lyndee Hensen, PT, DPT ?11:15 AM  08/29/21 ? ? ?

## 2021-08-31 ENCOUNTER — Ambulatory Visit (INDEPENDENT_AMBULATORY_CARE_PROVIDER_SITE_OTHER): Payer: BC Managed Care – PPO | Admitting: Physical Therapy

## 2021-08-31 ENCOUNTER — Encounter: Payer: Self-pay | Admitting: Physical Therapy

## 2021-08-31 DIAGNOSIS — M25551 Pain in right hip: Secondary | ICD-10-CM

## 2021-08-31 DIAGNOSIS — G8929 Other chronic pain: Secondary | ICD-10-CM

## 2021-08-31 DIAGNOSIS — M545 Low back pain, unspecified: Secondary | ICD-10-CM

## 2021-08-31 NOTE — Therapy (Signed)
?OUTPATIENT PHYSICAL THERAPY TREATMENT ? ? ?Patient Name: Cylinda Santoli ?MRN: [id number] ?DOB:Jul 07, 1975, 46 y.o., female ?Today's Date: 08/31/2021 ? ? PT End of Session - 08/31/21 1024   ? ? Visit Number 6   ? Number of Visits 16   ? Date for PT Re-Evaluation 10/06/21   ? Authorization Type BCBS   ? PT Start Time 1018   ? PT Stop Time 1100   ? PT Time Calculation (min) 42 min   ? Activity Tolerance Patient tolerated treatment well   ? Behavior During Therapy [facility] for tasks assessed/performed   ? ?  ?  ? ?  ? ? ? ?Past Medical History:  ?Diagnosis Date  ? Allergy   ? Anxiety   ? Asthma   ? History of COVID-19 12/23/2020  ? Hypoglycemia   ? Migraine with aura   ? ?Past Surgical History:  ?Procedure Laterality Date  ? APPENDECTOMY    ? TONSILLECTOMY AND ADENOIDECTOMY    ? ?Patient Active Problem List  ? Diagnosis Date Noted  ? Vitamin D deficiency 05/24/2020  ? Primary hypertension 05/24/2020  ? Migraine with aura and without status migrainosus, not intractable 05/24/2020  ? Anxiety 05/24/2020  ? ? ?PCP: [name], PA ? ?REFERRING PROVIDER: [name], PA ? ?REFERRING DIAG: R hip pain, low back pain ? ?THERAPY DIAG:  ?Chronic bilateral low back pain without sciatica ? ?Pain in right hip ? ?ONSET DATE: 2 years ago ? ?SUBJECTIVE:                                                                                                                                                                                          ? ?SUBJECTIVE STATEMENT: ? Pt  states less pain overall. Still has twinges with certain motions.  ? ? ?PERTINENT HISTORY:  ?None ? ?PAIN:  Has had pain daily, only down to 4/10 with a more restful day.  ?Are you having pain? Yes: NPRS scale: up to 6-7 /10 ?Pain location: R anterior hip ?Pain description: achey ?Aggravating factors: walking, increased activity, running,  ?Relieving factors: rest, ice ? ? ?PRECAUTIONS: None ? ?WEIGHT BEARING RESTRICTIONS No ? ?FALLS:  ?Has patient fallen in last 6 months?  No ? ? ?PLOF: Independent ? ?PATIENT GOALS : Decreased pain in hip, continue exercise with less pain.  ? ? ?OBJECTIVE:  ? ?DIAGNOSTIC FINDINGS:  ?Neg X-ray for hip.  ? ? ?COGNITION: ? Overall cognitive status: Within functional limits for tasks assessed  ? ?POSTURE:  ?unremarkable ? ?PALPATION: ?Minimal pain with palpation of groin line, anterior hip, hip flexor or glute. Mild tenderness in R SI,  Minimal pain in l-spine  with PA or palpation.  ? ? ?LUMBAR ROM:  ?Hip ROM: WNL ?Lumbar: WNL, some hypermobility ? ?Active  A/PROM  ?08/11/2021  ?Flexion WNL  ?Extension WNL  ?Right lateral flexion WNL  ?Left lateral flexion WNL  ?Right rotation   ?Left rotation   ? (Blank rows = not tested) ? ? ?LE MMT: ? ?MMT Right ?08/11/2021 Left ?08/11/2021  ?Hip flexion 4 4  ?Hip extension    ?Hip abduction 4 4  ?Hip adduction    ?Hip internal rotation    ?Hip external rotation 4 4  ?Knee flexion 5 5  ?Knee extension 5 5  ?Ankle dorsiflexion    ?Ankle plantarflexion    ?Ankle inversion    ?Ankle eversion    ? (Blank rows = not tested) ? ?LUMBAR SPECIAL TESTS:  ?Increased pain with FADIR, FABER, Pain with end range hip flexion  ? ? ?TODAY'S TREATMENT  ?  ?Therapeutic Exercise: ?5/10/ 23:  ?Aerobic: Bike L2 x 8 min  ?Supine: Bridging x 20 with Black TB x 20; Hip add ball sq with TA x 15;  ?Seated:   ?Standing:  Walk/March, fwd with 10lb weight x 6 ;  ?Squats on BOSU x 20;  Hip abd YTB 2x10 bil; Lunge (back step) x 10 bil; SLS reach to chair (mod RDL) x 10 bil;   ?Stretches:   ?Neuromuscular Re-education: ?Manual Therapy: ?Self Care: ? ? ?PATIENT EDUCATION: updated and reviewed HEP ?Person educated: Patient ?Education method: Explanation, Demonstration, Tactile cues, Verbal cues, and Handouts ?Education comprehension: verbalized understanding, returned demonstration, verbal cues required, tactile cues required, and needs further education ? ? ?HOME EXERCISE PROGRAM: ?Access Code: [email or web address] ?Updated  08/22/21 ? ? ?ASSESSMENT: ? ?CLINICAL  IMPRESSION: ?Pt with good ability for ther ex today. Very minimal pain with hip strength and stabilization today. Progressing to higher level activities, with good ability. Cued for continued slow, controlled motions. Minimal pain with Hip Fadir/faber testing today as well, which is an improvement.  ? ? ?OBJECTIVE IMPAIRMENTS decreased activity tolerance, decreased balance, decreased endurance, decreased knowledge of use of DME, decreased strength, increased muscle spasms, improper body mechanics, and pain.  ? ?ACTIVITY LIMITATIONS cleaning, community activity, meal prep, occupation, laundry, yard work, and shopping.  ? ?PERSONAL FACTORS  none  are also affecting patient's functional outcome.  ? ? ?REHAB POTENTIAL: Good ? ?CLINICAL DECISION MAKING: Stable/uncomplicated ? ?EVALUATION COMPLEXITY: Low ? ? ?GOALS: ?Goals reviewed with patient? Yes ? ?SHORT TERM GOALS: Target date: 09/14/2021 ? ?Pt to be independent with initial HEP ? ?Goal status: INITIAL ? ? ? ?LONG TERM GOALS: Target date: 10/26/2021 ? ?Pt to be independent with final HEP ? ?Goal status: INITIAL ? ?2. Pt to report decreased pain in R hip to 0-2/10 with standing and walking activity, up to 1 hr.  ? ?Goal status: INITIAL ? ?3.  Pt to report decreased pain in R hip, to 0-2/10 with exercise/strengthening class for at least 45 min.  ? ?Goal status: INITIAL ? ?4.  Pt to demo improved hip strength to at least 4+/5 mmt, and WFL for standing and SL activity.  ? ?Goal status: INITIAL ? ? ? ?PLAN: ?PT FREQUENCY: 1-2x/week ? ?PT DURATION: 8 weeks ? ?PLANNED INTERVENTIONS: Therapeutic exercises, Therapeutic activity, Neuromuscular re-education, Balance training, Gait training, Patient/Family education, Joint manipulation, Joint mobilization, DME instructions, Dry Needling, Electrical stimulation, Spinal manipulation, Spinal mobilization, Cryotherapy, Moist heat, Taping, Traction, Ultrasound, Ionotophoresis '4mg'$ /ml Dexamethasone, and Manual therapy. ? ?PLAN FOR NEXT  SESSION: Core and hip strength and stabilization  ? ?  Lyndee Hensen, PT, DPT ?10:25 AM  08/31/21 ? ? ?

## 2021-09-05 ENCOUNTER — Encounter: Payer: Self-pay | Admitting: Physical Therapy

## 2021-09-05 ENCOUNTER — Other Ambulatory Visit: Payer: Self-pay

## 2021-09-05 ENCOUNTER — Inpatient Hospital Stay: Payer: BC Managed Care – PPO

## 2021-09-05 ENCOUNTER — Encounter: Payer: Self-pay | Admitting: Licensed Clinical Social Worker

## 2021-09-05 ENCOUNTER — Ambulatory Visit (INDEPENDENT_AMBULATORY_CARE_PROVIDER_SITE_OTHER): Payer: BC Managed Care – PPO | Admitting: Physical Therapy

## 2021-09-05 ENCOUNTER — Inpatient Hospital Stay: Payer: BC Managed Care – PPO | Attending: Genetic Counselor | Admitting: Licensed Clinical Social Worker

## 2021-09-05 DIAGNOSIS — Z803 Family history of malignant neoplasm of breast: Secondary | ICD-10-CM | POA: Diagnosis not present

## 2021-09-05 DIAGNOSIS — M25551 Pain in right hip: Secondary | ICD-10-CM

## 2021-09-05 DIAGNOSIS — M545 Low back pain, unspecified: Secondary | ICD-10-CM

## 2021-09-05 DIAGNOSIS — G8929 Other chronic pain: Secondary | ICD-10-CM | POA: Diagnosis not present

## 2021-09-05 NOTE — Progress Notes (Signed)
REFERRING PROVIDER: ?Nunzio Cobbs, MD ?[address] ?[address] ?[place],  [place] [address] ? ?PRIMARY PROVIDER:  ?[name], PA ? ?PRIMARY REASON FOR VISIT:  ?1. Family history of breast cancer   ? ? ? ?HISTORY OF PRESENT ILLNESS:   ?Shirley Fleming, a 46 y.o. female, was seen for a [place] cancer genetics consultation at the request of Dr. Yisroel Ramming due to a family history of breast cancer.  Shirley Fleming presents to clinic today to discuss the possibility of a hereditary predisposition to cancer, genetic testing, and to further clarify her future cancer risks, as well as potential cancer risks for family members.  ? ?CANCER HISTORY:  ?Shirley Fleming is a 46 y.o. female with no personal history of cancer.   ? ?RISK FACTORS:  ?Menarche was at age 27.  ?First live birth at age 40.  ?OCP use for approximately 10 years.  ?Ovaries intact: yes.  ?Hysterectomy: no.  ?Menopausal status: premenopausal.  ?HRT use: 0 years. ?Colonoscopy: no; not examined. ?Mammogram within the last year: yes. ?Number of breast biopsies: 0. ?Up to date with pelvic exams: yes. ? ? ?Past Medical History:  ?Diagnosis Date  ? Allergy   ? Anxiety   ? Asthma   ? History of COVID-19 12/23/2020  ? Hypoglycemia   ? Migraine with aura   ? ? ?Past Surgical History:  ?Procedure Laterality Date  ? APPENDECTOMY    ? TONSILLECTOMY AND ADENOIDECTOMY    ? ?FAMILY HISTORY:  ?We obtained a detailed, 4-generation family history.  Significant diagnoses are listed below: ?Family History  ?Problem Relation Age of Onset  ? COPD Mother   ? Depression Mother   ? Heart attack Father 34  ? Hypertension Father   ? Breast cancer Maternal Aunt   ?     dx mid 82s  ? Diabetes Maternal Aunt   ? Hypertension Maternal Aunt   ? Breast cancer Maternal Grandmother   ?     dx 66s, d. 47s  ? Heart attack Maternal Grandfather   ? Breast cancer Paternal Grandmother   ?     dx 7s; + vaginal cancer 24s, jaw cancer 67s  ? Heart attack Paternal Grandfather   ? ?Ms.  Fleming has 1 son, 27 who was recently diagnosed with hypermobile Drue Dun. She has 1 daughter, 26, and 1 brother 33. ? ?Shirley Fleming's mother is living at 46. Patient has 1 maternal aunt who had breast cancer in her mid-late 24s. Maternal grandmother had breast cancer in her mid 84s and passed in her mid 59s of metastatic cancer. Grandfather passed in his 67s. ? ?Shirley Fleming's father is living at 55. Paternal grandmother had breast cancer in her 72s and had double mastectomy, vaginal cancer in her 68s and jaw cancer in her 39s. Paternal grandfather passed in his 5s, his mother may have had breast cancer.  ? ?Shirley Fleming is unaware of previous family history of genetic testing for hereditary cancer risks.  There is no reported Ashkenazi Jewish ancestry. There is no known consanguinity. ? ? ? ?GENETIC COUNSELING ASSESSMENT: Shirley Fleming is a 46 y.o. female with a family history of breast cancer which is somewhat suggestive of a hereditary cancer syndrome and predisposition to cancer. We, therefore, discussed and recommended the following at today's visit.  ? ?DISCUSSION: We discussed that approximately 10% of breast cancer is hereditary. Most cases of hereditary breast cancer are associated with BRCA1/BRCA2 genes, although there are other genes associated with  hereditary cancer as well. Cancers and risks are gene specific. We discussed that testing is beneficial for several reasons including knowing about cancer risks, identifying potential screening and risk-reduction options that may be appropriate, and to understand if other family members could be at risk for cancer and allow them to undergo genetic testing.  ? ?We reviewed the characteristics, features and inheritance patterns of hereditary cancer syndromes. We also discussed genetic testing, including the appropriate family members to test, the process of testing, insurance coverage and turn-around-time for results. We discussed the implications of a negative,  positive and/or variant of uncertain significant result. We recommended Shirley Fleming pursue genetic testing for the [facility] CancerNext-Expanded+RNA gene panel.  ? ?Based on Shirley Fleming's family history of cancer, she meets medical criteria for genetic testing. Despite that she meets criteria, she may still have an out of pocket cost. We discussed that if her out of pocket cost for testing is over $100, the laboratory will call and confirm whether she wants to proceed with testing.  If the out of pocket cost of testing is less than $100 she will be billed by the genetic testing laboratory.  ? ?We discussed that some people do not want to undergo genetic testing due to fear of genetic discrimination.  A federal law called the Genetic Information Non-Discrimination Act (GINA) of 2008 helps protect individuals against genetic discrimination based on their genetic test results.  It impacts both health insurance and employment.  For health insurance, it protects against increased premiums, being kicked off insurance or being forced to take a test in order to be insured.  For employment it protects against hiring, firing and promoting decisions based on genetic test results.  Health status due to a cancer diagnosis is not protected under GINA.  This law does not protect life insurance, disability insurance, or other types of insurance.  ? ?PLAN: After considering the risks, benefits, and limitations, Shirley Fleming provided informed consent to pursue genetic testing and the blood sample was sent to [facility] for analysis of the CancerNext-Expanded+RNA panel. Results should be available within approximately 2-3 weeks' time, at which point they will be disclosed by telephone to Shirley Fleming, as will any additional recommendations warranted by these results. Shirley Fleming will receive a summary of her genetic counseling visit and a copy of her results once available. This information will also be available in Epic.  ? ?Shirley Fleming's  questions were answered to her satisfaction today. Our contact information was provided should additional questions or concerns arise. Thank you for the referral and allowing [place] to share in the care of your patient.  ? ?Faith Rogue, MS, LCGC ?Genetic Counselor ?[email or web address] ?Phone: [phone] ? ?The patient was seen for a total of 25 minutes in face-to-face genetic counseling.  Dr. Grayland Ormond was available for discussion regarding this case.  ? ?_______________________________________________________________________ ?For Office Staff:  ?Number of people involved in session: 1 ?Was an Intern/ student involved with case: no ? ?

## 2021-09-05 NOTE — Therapy (Signed)
?OUTPATIENT PHYSICAL THERAPY TREATMENT ? ? ?Patient Name: Shirley Fleming ?MRN: [id number] ?DOB:06-23-1975, 46 y.o., female ?Today's Date: 09/05/2021 ? ? PT End of Session - 09/05/21 1045   ? ? Visit Number 7   ? Number of Visits 16   ? Date for PT Re-Evaluation 10/06/21   ? Authorization Type BCBS   ? PT Start Time 1022   ? PT Stop Time 1100   ? PT Time Calculation (min) 38 min   ? Activity Tolerance Patient tolerated treatment well   ? Behavior During Therapy [facility] for tasks assessed/performed   ? ?  ?  ? ?  ? ? ? ?Past Medical History:  ?Diagnosis Date  ? Allergy   ? Anxiety   ? Asthma   ? History of COVID-19 12/23/2020  ? Hypoglycemia   ? Migraine with aura   ? ?Past Surgical History:  ?Procedure Laterality Date  ? APPENDECTOMY    ? TONSILLECTOMY AND ADENOIDECTOMY    ? ?Patient Active Problem List  ? Diagnosis Date Noted  ? Vitamin D deficiency 05/24/2020  ? Primary hypertension 05/24/2020  ? Migraine with aura and without status migrainosus, not intractable 05/24/2020  ? Anxiety 05/24/2020  ? ? ?PCP: [name], PA ? ?REFERRING PROVIDER: [name], PA ? ?REFERRING DIAG: R hip pain, low back pain ? ?THERAPY DIAG:  ?Chronic bilateral low back pain without sciatica ? ?Pain in right hip ? ?ONSET DATE: 2 years ago ? ?SUBJECTIVE:                                                                                                                                                                                          ? ?SUBJECTIVE STATEMENT: ? Pt doing very well.  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   ? ? ?PERTINENT HISTORY:  ?None ? ?PAIN:  Has had pain daily, only down to 4/10 with a more restful day.  ?Are you having pain? Yes: NPRS scale: up to 6-7 /10 ?Pain location: R anterior hip ?Pain description: achey ?Aggravating factors: walking, increased activity, running,  ?Relieving factors: rest, ice ? ? ?PRECAUTIONS: None ? ?WEIGHT BEARING RESTRICTIONS No ? ?FALLS:  ?Has  patient fallen in last 6 months? No ? ? ?PLOF: Independent ? ?PATIENT GOALS : Decreased pain in hip, continue exercise with less pain.  ? ? ?OBJECTIVE:  ? ?DIAGNOSTIC FINDINGS:  ?Neg X-ray for hip.  ? ? ?COGNITION: ? Overall cognitive status: Within functional limits for tasks assessed  ? ?POSTURE:  ?unremarkable ? ?PALPATION: ?Minimal pain with palpation of groin line, anterior hip, hip flexor or glute. Mild tenderness in R SI,  Minimal pain in l-spine with PA or palpation.  ? ? ?LUMBAR ROM:  ?Hip ROM: WNL ?Lumbar: WNL, some hypermobility ? ?Active  A/PROM  ?08/11/2021  ?Flexion WNL  ?Extension WNL  ?Right lateral flexion WNL  ?Left lateral flexion WNL  ?Right rotation   ?Left rotation   ? (Blank rows = not tested) ? ? ?LE MMT: ? ?MMT Right ?08/11/2021 Left ?08/11/2021 R ?09/05/21  ?Hip flexion 4 4 4+  ?Hip extension     ?Hip abduction 4 4 4+  ?Hip adduction     ?Hip internal rotation     ?Hip external rotation 4 4 4+  ?Knee flexion 5 5   ?Knee extension 5 5   ?Ankle dorsiflexion     ?Ankle plantarflexion     ?Ankle inversion     ?Ankle eversion     ? (Blank rows = not tested) ? ?LUMBAR SPECIAL TESTS:  ?Increased pain with FADIR, FABER, Pain with end range hip flexion  ? ? ?TODAY'S TREATMENT  ?  ?Therapeutic Exercise: ?5/ 15 /23:  ?Aerobic: Bike L2 x 8 min  ?Supine: Bridging x 20 with Black TB x 15; x15 with Hip add ball sq  ?Seated:   ?Standing:  Walk/March, fwd/bwd  with 10lb weight x 6 ;  ? Hip abd YTB 2x10 bil;  SL RDL x 10 bil with 10lb weight ;   ?Stretches:   ?Neuromuscular Re-education: ?Manual Therapy: ?Self Care: ? ? ?PATIENT EDUCATION: updated and reviewed HEP ?Person educated: Patient ?Education method: Explanation, Demonstration, Tactile cues, Verbal cues, and Handouts ?Education comprehension: verbalized understanding, returned demonstration, verbal cues required, tactile cues required, and needs further education ? ? ?HOME EXERCISE PROGRAM: ?Access Code: [email or web address] ?Updated   08/22/21 ? ? ?ASSESSMENT: ? ?CLINICAL IMPRESSION: ?Pt making good progress. She will be seen for 1 more visit prior to f/u with MD. She is doing very well with strengthening exercises. She has some soreness with lateral or rotation motions at times, but overall doing very well with function. Likely d/c at next visit.  ? ? ?OBJECTIVE IMPAIRMENTS decreased activity tolerance, decreased balance, decreased endurance, decreased knowledge of use of DME, decreased strength, increased muscle spasms, improper body mechanics, and pain.  ? ?ACTIVITY LIMITATIONS cleaning, community activity, meal prep, occupation, laundry, yard work, and shopping.  ? ?PERSONAL FACTORS  none  are also affecting patient's functional outcome.  ? ? ?REHAB POTENTIAL: Good ? ?CLINICAL DECISION MAKING: Stable/uncomplicated ? ?EVALUATION COMPLEXITY: Low ? ? ?GOALS: ?Goals reviewed with patient? Yes ? ?SHORT TERM GOALS: Target date: 09/19/2021 ? ?Pt to be independent with initial HEP ? ?Goal status: INITIAL ? ? ? ?LONG TERM GOALS: Target date: 10/31/2021 ? ?Pt to be independent with final HEP ? ?Goal status: INITIAL ? ?2. Pt to report decreased pain in R hip to 0-2/10 with standing and walking activity, up to 1 hr.  ? ?Goal status: INITIAL ? ?3.  Pt to report decreased pain in R hip, to 0-2/10 with exercise/strengthening class for at least 45 min.  ? ?Goal status: INITIAL ? ?4.  Pt to demo improved hip strength to at least 4+/5 mmt, and WFL for standing and SL activity.  ? ?Goal status: INITIAL ? ? ? ?PLAN: ?PT FREQUENCY: 1-2x/week ? ?PT DURATION: 8 weeks ? ?PLANNED INTERVENTIONS: Therapeutic exercises, Therapeutic activity, Neuromuscular re-education, Balance training, Gait training, Patient/Family education, Joint manipulation, Joint mobilization, DME instructions, Dry Needling, Electrical stimulation, Spinal manipulation, Spinal mobilization, Cryotherapy, Moist heat, Taping, Traction, Ultrasound, Ionotophoresis '4mg'$ /ml Dexamethasone, and Manual  therapy. ? ?  PLAN FOR NEXT SESSION: Core and hip strength and stabilization  ? ?Lyndee Hensen, PT, DPT ?12:21 PM  09/05/21 ? ? ?

## 2021-09-07 ENCOUNTER — Encounter: Payer: BC Managed Care – PPO | Admitting: Physical Therapy

## 2021-09-20 NOTE — Progress Notes (Deleted)
   I, Wendy Poet, LAT, ATC, am serving as scribe for Dr. [name].  [place] Nunley is a 46 y.o. 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  She was last seen by Dr. Georgina Snell on 08/09/21 and was referred to PT of which she completed 7 visits.  Today, pt reports  Diagnostic testing: R hip and L-spine XR- 08/09/21  Pertinent review of systems: ***  Relevant historical information: ***   Exam:  There were no vitals taken for this visit. General: Well Developed, well nourished, and in no acute distress.   MSK: ***    Lab and Radiology Results No results found for this or any previous visit (from the past 72 hour(s)). No results found.     Assessment and Plan: 46 y.o. female with ***   PDMP not reviewed this encounter. No orders of the defined types were placed in this encounter.  No orders of the defined types were placed in this encounter.    Discussed warning signs or symptoms. Please see discharge instructions. Patient expresses understanding.   ***

## 2021-09-21 ENCOUNTER — Encounter: Payer: BC Managed Care – PPO | Admitting: Physical Therapy

## 2021-09-21 ENCOUNTER — Ambulatory Visit: Payer: BC Managed Care – PPO | Admitting: Family Medicine

## 2021-09-22 ENCOUNTER — Encounter: Payer: Self-pay | Admitting: Licensed Clinical Social Worker

## 2021-09-22 ENCOUNTER — Telehealth: Payer: Self-pay | Admitting: Hematology and Oncology

## 2021-09-22 ENCOUNTER — Ambulatory Visit: Payer: Self-pay | Admitting: Licensed Clinical Social Worker

## 2021-09-22 ENCOUNTER — Telehealth: Payer: Self-pay | Admitting: Licensed Clinical Social Worker

## 2021-09-22 DIAGNOSIS — Z1379 Encounter for other screening for genetic and chromosomal anomalies: Secondary | ICD-10-CM

## 2021-09-22 NOTE — Telephone Encounter (Signed)
Scheduled appt per 6/1 referral. Pt is aware of appt date and time. Pt is aware to arrive 15 mins prior to appt time and to bring and updated insurance card. Pt is aware of appt location.

## 2021-09-22 NOTE — Telephone Encounter (Signed)
Revealed negative genetic testing.  This normal result is reassuring.  It is unlikely that there is an increased risk of cancer due to a mutation in one of these genes.  However, genetic testing is not perfect, and cannot definitively rule out a hereditary cause.  It will be important for her to keep in contact with genetics to learn if any additional testing may be needed in the future.      

## 2021-09-22 NOTE — Progress Notes (Signed)
HPI:  Shirley Fleming was previously seen in the [address] clinic due to a family history of cancer and concerns regarding a hereditary predisposition to cancer. Please refer to our prior cancer genetics clinic note for more information regarding our discussion, assessment and recommendations, at the time. Shirley Fleming recent genetic test results were disclosed to her, as were recommendations warranted by these results. These results and recommendations are discussed in more detail below.  CANCER HISTORY:  Oncology History   No history exists.    FAMILY HISTORY:  We obtained a detailed, 4-generation family history.  Significant diagnoses are listed below: Family History  Problem Relation Age of Onset   COPD Mother    Depression Mother    Heart attack Father 67   Hypertension Father    Breast cancer Maternal Aunt        dx mid 13s   Diabetes Maternal Aunt    Hypertension Maternal Aunt    Breast cancer Maternal Grandmother        dx 26s, d. 106s   Heart attack Maternal Grandfather    Breast cancer Paternal Grandmother        dx 61s; + vaginal cancer 56s, jaw cancer 68s   Heart attack Paternal Grandfather      Ms. Bertucci has 1 son, 23 who was recently diagnosed with hypermobile Drue Dun. She has 1 daughter, 16, and 1 brother 40.   Ms. Sumler mother is living at 52. Patient has 1 maternal aunt who had breast cancer in her mid-late 48s. Maternal grandmother had breast cancer in her mid 60s and passed in her mid 51s of metastatic cancer. Grandfather passed in his 53s.   Ms. Linehan father is living at 15. Paternal grandmother had breast cancer in her 82s and had double mastectomy, vaginal cancer in her 86s and jaw cancer in her 72s. Paternal grandfather passed in his 82s, his mother may have had breast cancer.    Ms. Uffelman is unaware of previous family history of genetic testing for hereditary cancer risks.  There is no reported Ashkenazi Jewish ancestry. There is no known  consanguinity.     GENETIC TEST RESULTS: Genetic testing reported out on 09/20/2021 through the [employer] CancerNext-Expanded+RNA cancer panel found no pathogenic mutations.   The CancerNext-Expanded + RNAinsight gene panel offered by [employer] and includes sequencing and rearrangement analysis for the following 77 genes: IP, ALK, APC*, ATM*, AXIN2, BAP1, BARD1, BLM, BMPR1A, BRCA1*, BRCA2*, BRIP1*, CDC73, CDH1*,CDK4, CDKN1B, CDKN2A, CHEK2*, CTNNA1, DICER1, FANCC, FH, FLCN, GALNT12, KIF1B, LZTR1, MAX, MEN1, MET, MLH1*, MSH2*, MSH3, MSH6*, MUTYH*, NBN, NF1*, NF2, NTHL1, PALB2*, PHOX2B, PMS2*, POT1, PRKAR1A, PTCH1, PTEN*, RAD51C*, RAD51D*,RB1, RECQL, RET, SDHA, SDHAF2, SDHB, SDHC, SDHD, SMAD4, SMARCA4, SMARCB1, SMARCE1, STK11, SUFU, TMEM127, TP53*,TSC1, TSC2, VHL and XRCC2 (sequencing and deletion/duplication); EGFR, EGLN1, HOXB13, KIT, MITF, PDGFRA, POLD1 and POLE (sequencing only); EPCAM and GREM1 (deletion/duplication only).   The test report has been scanned into EPIC and is located under the Molecular Pathology section of the Results Review tab.  A portion of the result report is included below for reference.     We discussed that because current genetic testing is not perfect, it is possible there may be a gene mutation in one of these genes that current testing cannot detect, but that chance is small.  There could be another gene that has not yet been discovered, or that we have not yet tested, that is responsible for the cancer diagnoses in the family. It is  also possible there is a hereditary cause for the cancer in the family that Shirley Fleming did not inherit and therefore was not identified in her testing.  Therefore, it is important to remain in touch with cancer genetics in the future so that we can continue to offer Shirley Fleming the most up to date genetic testing.   ADDITIONAL GENETIC TESTING: We discussed with Shirley Fleming that her genetic testing was fairly extensive.  If there are genes  identified to increase cancer risk that can be analyzed in the future, we would be happy to discuss and coordinate this testing at that time.    CANCER SCREENING RECOMMENDATIONS: Shirley Fleming test result is considered negative (normal).  This means that we have not identified a hereditary cause for her family history of cancer at this time.   While reassuring, this does not definitively rule out a hereditary predisposition to cancer. It is still possible that there could be genetic mutations that are undetectable by current technology. There could be genetic mutations in genes that have not been tested or identified to increase cancer risk.  Therefore, it is recommended she continue to follow the cancer management and screening guidelines provided by her primary healthcare provider.   An individual's cancer risk and medical management are not determined by genetic test results alone. Overall cancer risk assessment incorporates additional factors, including personal medical history, family history, and any available genetic information that may result in a personalized plan for cancer prevention and surveillance.  Shirley Fleming has been determined to be at high risk for breast cancer. her Tyrer-Cuzick risk score is 19.6%.  For women with a greater than 20% lifetime risk of breast cancer, the [employer]  (NCCN) recommends the following:   1.      Clinical encounter every 6-12 months to begin when identified as being at increased risk, but not before age 40  2.      Annual mammograms. Tomosynthesis is recommended starting 10 years earlier than the youngest breast cancer diagnosis in the family or at age 34 (whichever comes first), but not before age 97    3.      Annual breast MRI starting 10 years earlier than the youngest breast cancer diagnosis in the family or at age 18 (whichever comes first), but not before age 44      A referral was placed to the high risk breast clinic  today.   RECOMMENDATIONS FOR FAMILY MEMBERS:  Relatives in this family might be at some increased risk of developing cancer, over the general population risk, simply due to the family history of cancer.  We recommended female relatives in this family have a yearly mammogram beginning at age 23, or 59 years younger than the earliest onset of cancer, an annual clinical breast exam, and perform monthly breast self-exams. Female relatives in this family should also have a gynecological exam as recommended by their primary provider.  All family members should be referred for colonoscopy starting at age 60.    It is also possible there is a hereditary cause for the cancer in Shirley Fleming's family that she did not inherit and therefore was not identified in her.  Based on Shirley Fleming's family history, we recommended maternal and paternal relatives have genetic counseling and testing. Shirley Fleming will let us know if we can be of any assistance in coordinating genetic counseling and/or testing for these family members.  FOLLOW-UP: Lastly, we discussed with Shirley Fleming that cancer genetics is  a rapidly advancing field and it is possible that new genetic tests will be appropriate for her and/or her family members in the future. We encouraged her to remain in contact with cancer genetics on an annual basis so we can update her personal and family histories and let her know of advances in cancer genetics that may benefit this family.   Our contact number was provided. Shirley Fleming questions were answered to her satisfaction, and she knows she is welcome to call us at anytime with additional questions or concerns.   Faith Rogue, MS, [facility] Genetic Counselor [email or web address] Phone: [phone]

## 2021-09-26 NOTE — Progress Notes (Unsigned)
   I, Shirley Fleming, LAT, ATC, am serving as scribe for Dr. [name].  Shirley Fleming is a 46 y.o. female who presents to [place] [facility] today for f/u of R hip and ant thigh pain likely due to femoral acetabular impingement or labral tear.  She was last seen by Dr. Georgina Snell on 08/09/21 and was referred to PT at [facility] of which she's completed 7 visits.  Today, pt reports R hip is feeling better, 75% improvement. Pt notes that the pain is only improved when she is consistent w/ her HEP.  Diagnostic testing: R hip and L-spine XR- 08/09/21  Pertinent review of systems: No fevers or chills  Relevant historical information: Hypertension   Exam:  BP 118/82   Pulse 72   Ht '5\' 5"'$  (1.651 m)   Wt 194 lb 3.2 oz (88.1 kg)   SpO2 98%   BMI 32.32 kg/m  General: Well Developed, well nourished, and in no acute distress.   MSK: Right hip normal.  Normal motion normal gait.    Lab and Radiology Results  EXAM: DG HIP (WITH OR WITHOUT PELVIS) 2-3V RIGHT   COMPARISON:  None.   FINDINGS: There is no evidence of hip fracture or dislocation. There is no evidence of arthropathy or other focal bone abnormality.   IMPRESSION: Negative.     Electronically Signed   By: Claudie Revering M.D.   On: 08/11/2021 09:59    EXAM: LUMBAR SPINE - 2-3 VIEW   COMPARISON:  None.   FINDINGS: Transitional thoracolumbar and lumbosacral vertebrae with 4 intervening non-rib-bearing lumbar vertebrae. Mild anterior spur formation at multiple levels of the lumbar and lower thoracic spine. No fractures, pars defects or subluxations.   IMPRESSION: Mild multilevel degenerative changes.     Electronically Signed   By: Claudie Revering M.D.   On: 08/11/2021 10:01   I, [name], personally (independently) visualized and performed the interpretation of the images attached in this note.     Assessment and Plan: 46 y.o. female with right anterior hip pain thought to be due to hip  impingement.  X-ray per my read is concerning for hip impingement although radiologist did not comment on it.  She is doing well with home exercise program after finishing PT.  Plan for watchful waiting for now.  If not improving or worsening next step would be MRI arthrogram.  Spent time talking about treatment plan and options.  Recheck back as needed. Total encounter time 20 minutes including face-to-face time with the patient and, reviewing past medical record, and charting on the date of service.     PDMP not reviewed this encounter. No orders of the defined types were placed in this encounter.  No orders of the defined types were placed in this encounter.    Discussed warning signs or symptoms. Please see discharge instructions. Patient expresses understanding.   The above documentation has been reviewed and is accurate and complete [name], M.D.

## 2021-09-27 ENCOUNTER — Ambulatory Visit (INDEPENDENT_AMBULATORY_CARE_PROVIDER_SITE_OTHER): Payer: BC Managed Care – PPO | Admitting: Family Medicine

## 2021-09-27 ENCOUNTER — Other Ambulatory Visit (HOSPITAL_BASED_OUTPATIENT_CLINIC_OR_DEPARTMENT_OTHER): Payer: Self-pay

## 2021-09-27 VITALS — BP 118/82 | HR 72 | Ht 65.0 in | Wt 194.2 lb

## 2021-09-27 DIAGNOSIS — M25551 Pain in right hip: Secondary | ICD-10-CM | POA: Diagnosis not present

## 2021-09-27 NOTE — Patient Instructions (Addendum)
Thank you for coming in today.   Recheck as needed.   We can do MRI arthrogram as next step.

## 2021-10-17 NOTE — Progress Notes (Signed)
[place] CONSULT NOTE  Patient Care Team: [name], [place] as PCP - General (Physician Assistant)  CHIEF COMPLAINTS/PURPOSE OF CONSULTATION:  At high risk for breast cancer  HISTORY OF PRESENTING ILLNESS:  Shirley Fleming 46 y.o. female is here because of recent genetic work-up which was negative for any gene mutations but that she was felt to be high risk by family history and she was referred to [place] for further evaluation.  Her son was recently diagnosed with Ehlers-Danlos syndrome and so she has been busy taking care of his appointments and therefore did not get her mammogram this year yet.  She denies any lumps or nodules in the breast.  I reviewed her records extensively and collaborated the history with the patient.    MEDICAL HISTORY:  Past Medical History:  Diagnosis Date   Allergy    Anxiety    Asthma    History of COVID-19 12/23/2020   Hypoglycemia    Migraine with aura     SURGICAL HISTORY: Past Surgical History:  Procedure Laterality Date   APPENDECTOMY     TONSILLECTOMY AND ADENOIDECTOMY      SOCIAL HISTORY: Social History   Socioeconomic History   Marital status: Married    Spouse name: Not on file   Number of children: Not on file   Years of education: Not on file   Highest education level: Not on file  Occupational History   Not on file  Tobacco Use   Smoking status: Former    Types: Cigarettes    Quit date: 04/24/2005    Years since quitting: 16.5    Passive exposure: Never   Smokeless tobacco: Never  Vaping Use   Vaping Use: Never used  Substance and Sexual Activity   Alcohol use: Yes    Alcohol/week: 2.0 - 3.0 standard drinks of alcohol    Types: 2 - 3 Glasses of wine per week   Drug use: Never   Sexual activity: Yes    Birth control/protection: Pill, Surgical    Comment: Micronor/vasectomy  Other Topics Concern   Not on file  Social History Narrative   Married   Two children    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   From [place] -- June 2019   Social Determinants of Radio broadcast assistant Strain: Not on Art therapist Insecurity: Not on file  Transportation Needs: Not on file  Physical Activity: Not on file  Stress: Not on file  Social Connections: Not on file  Intimate Partner Violence: Not on file    FAMILY HISTORY: Family History  Problem Relation Age of Onset   COPD Mother    Depression Mother    Heart attack Father 50   Hypertension Father    Breast cancer Maternal Aunt        dx mid 27s   Diabetes Maternal Aunt    Hypertension Maternal Aunt    Breast cancer Maternal Grandmother        dx 24s, d. 3s   Heart attack Maternal Grandfather    Breast cancer Paternal Grandmother        dx 44s; + vaginal cancer 60s, jaw cancer 52s   Heart attack Paternal Grandfather     ALLERGIES:  is allergic to doxycycline and erythromycin.  MEDICATIONS:  Current Outpatient Medications  Medication Sig Dispense Refill   albuterol (VENTOLIN HFA) 108 (90 Base) MCG/ACT inhaler Inhale into the lungs every 6 (six) hours as needed.  ALPRAZolam (XANAX) 0.25 MG tablet Take 1 tablet (0.25 mg total) by mouth every 8 (eight) hours as needed. 30 tablet 2   B Complex-Biotin-FA (B-COMPLEX PO) Take 0.5 tablets by mouth daily in the afternoon.     cholecalciferol (VITAMIN D3) 25 MCG (1000 UT) tablet Take 1,000 Units by mouth daily.     ferrous sulfate 324 MG TBEC Take 65 mg by mouth.     FLUoxetine (PROZAC) 10 MG capsule Take 1 capsule (10 mg total) by mouth daily. 90 capsule 0   fluticasone (FLONASE) 50 MCG/ACT nasal spray Place 1 spray into both nostrils daily.     magnesium 30 MG tablet Take 30 mg by mouth daily.     Multiple Vitamin (MULTIVITAMIN) tablet Take 1 tablet by mouth daily.     norethindrone (MICRONOR) 0.35 MG tablet Take 1 tablet (0.35 mg total) by mouth daily. 84 tablet 3   propranolol (INDERAL) 10 MG tablet TAKE 1 TABLET (10 MG TOTAL) BY MOUTH EVERY 12 (TWELVE) HOURS AS NEEDED. 60  tablet 0   No current facility-administered medications for this visit.    REVIEW OF SYSTEMS:   Constitutional: Denies fevers, chills or abnormal night sweats   All other systems were reviewed with the patient and are negative.  PHYSICAL EXAMINATION: ECOG PERFORMANCE STATUS: 1 - Symptomatic but completely ambulatory  Vitals:   10/31/21 1256  BP: 135/75  Pulse: 70  Resp: 18  Temp: (!) 97.5 F (36.4 C)  SpO2: 99%   Filed Weights   10/31/21 1256  Weight: 196 lb 3.2 oz (89 kg)      LABORATORY DATA:  I have reviewed the data as listed Lab Results  Component Value Date   WBC 6.9 08/05/2021   HGB 14.8 08/05/2021   HCT 44.4 08/05/2021   MCV 90.4 08/05/2021   PLT 204 08/05/2021   Lab Results  Component Value Date   NA 140 05/24/2020   K 4.0 05/24/2020   CL 105 05/24/2020   CO2 28 05/24/2020    RADIOGRAPHIC STUDIES: I have personally reviewed the radiological reports and agreed with the findings in the report.  ASSESSMENT AND PLAN:  At high risk for breast cancer Genetic testing: Negative Family history: Maternal aunt, paternal grandmother, maternal grandmother had breast cancers  1.  Breast cancer risk: Tyrer-Cuzick: 10-year risk: 5.5% (average risk 2.2%) Lifetime risk:24% (average risk 10%) Gail model: 5-year risk: 1.1% (average risk 1%)  2. Risk reduction strategies: We discussed different risk reducing strategies for future breast cancer risk. We discussed chemoprevention and lifestyle modification. We discussed role of ongoing surveillance with mammograms versus MRIs.   3. Lifestyle modification: We discussed different interventions exercise at least 5 days a week including both aerobic as well as weight-bearing exercises and strength training. He discussed dietary modification including increasing number of servings of fruit and vegetables as well as decreased in number of servings of meat. We discussed the importance of maintaining a good BMI.  Discussed  eliminating or limiting alcohol consumption.    4. Surveillance: Patient certainly would be a good candidate for ongoing mammograms on a yearly basis. she could benefit from a 3-D mammogram. We discussed self breast examination as well    6. Genetics: Negative  7. Followup: Patient will follow-up with her primary care physician for her annual breast MRIs.  I ordered her first breast MRI to be done in January 2024.  Subsequently she will follow with the primary care physician who can order the breast MRIs annually. Patient  prefers that approach and she can be seen by [place] on an as-needed basis.   All questions were answered. The patient knows to call the clinic with any problems, questions or concerns.    Harriette Ohara, MD 10/31/21  I Gardiner Coins am scribing for Dr. Lindi Adie  I have reviewed the above documentation for accuracy and completeness, and I agree with the above.

## 2021-10-18 ENCOUNTER — Other Ambulatory Visit: Payer: Self-pay | Admitting: Cardiology

## 2021-10-31 ENCOUNTER — Other Ambulatory Visit: Payer: Self-pay | Admitting: Obstetrics and Gynecology

## 2021-10-31 ENCOUNTER — Inpatient Hospital Stay: Payer: BC Managed Care – PPO | Attending: Genetic Counselor | Admitting: Hematology and Oncology

## 2021-10-31 ENCOUNTER — Other Ambulatory Visit: Payer: Self-pay | Admitting: Physician Assistant

## 2021-10-31 ENCOUNTER — Other Ambulatory Visit: Payer: Self-pay

## 2021-10-31 VITALS — BP 135/75 | HR 70 | Temp 97.5°F | Resp 18 | Ht 65.0 in | Wt 196.2 lb

## 2021-10-31 DIAGNOSIS — Z1231 Encounter for screening mammogram for malignant neoplasm of breast: Secondary | ICD-10-CM

## 2021-10-31 DIAGNOSIS — Z803 Family history of malignant neoplasm of breast: Secondary | ICD-10-CM | POA: Diagnosis not present

## 2021-10-31 DIAGNOSIS — Z9189 Other specified personal risk factors, not elsewhere classified: Secondary | ICD-10-CM

## 2021-10-31 NOTE — Assessment & Plan Note (Addendum)
Genetic testing: Negative Family history: Maternal aunt, paternal grandmother, maternal grandmother had breast cancers  1.  Breast cancer risk: Tyrer-Cuzick: 10-year risk: 5.5% (average risk 2.2%) Lifetime risk:24% (average risk 10%) Gail model: 5-year risk: 1.1% (average risk 1%)  2. Risk reduction strategies: We discussed different risk reducing strategies for future breast cancer risk. We discussed chemoprevention and lifestyle modification. We discussed role of ongoing surveillance with mammograms versus MRIs.   3. Lifestyle modification: We discussed different interventions exercise at least 5 days a week including both aerobic as well as weight-bearing exercises and strength training. He discussed dietary modification including increasing number of servings of fruit and vegetables as well as decreased in number of servings of meat. We discussed the importance of maintaining a good BMI.  Discussed eliminating or limiting alcohol consumption.    4. Surveillance: Patient certainly would be a good candidate for ongoing mammograms on a yearly basis. she could benefit from a 3-D mammogram. We discussed self breast examination as well    6. Genetics: Negative  7. Followup: Patient will follow-up with her primary care physician for her annual breast MRIs.  I ordered her first breast MRI to be done in January 2024.  Subsequently she will follow with the primary care physician who can order the breast MRIs annually. Patient prefers that approach and she can be seen by [place] on an as-needed basis.

## 2021-11-01 ENCOUNTER — Other Ambulatory Visit (HOSPITAL_BASED_OUTPATIENT_CLINIC_OR_DEPARTMENT_OTHER): Payer: Self-pay

## 2021-11-03 ENCOUNTER — Ambulatory Visit: Payer: BC Managed Care – PPO | Admitting: Physician Assistant

## 2021-11-03 ENCOUNTER — Other Ambulatory Visit (HOSPITAL_BASED_OUTPATIENT_CLINIC_OR_DEPARTMENT_OTHER): Payer: Self-pay

## 2021-11-03 ENCOUNTER — Encounter: Payer: Self-pay | Admitting: Physician Assistant

## 2021-11-03 VITALS — BP 118/74 | HR 78 | Temp 97.9°F | Ht 65.0 in | Wt 195.5 lb

## 2021-11-03 DIAGNOSIS — R0981 Nasal congestion: Secondary | ICD-10-CM

## 2021-11-03 DIAGNOSIS — F419 Anxiety disorder, unspecified: Secondary | ICD-10-CM | POA: Diagnosis not present

## 2021-11-03 DIAGNOSIS — H6983 Other specified disorders of Eustachian tube, bilateral: Secondary | ICD-10-CM

## 2021-11-03 DIAGNOSIS — H6993 Unspecified Eustachian tube disorder, bilateral: Secondary | ICD-10-CM

## 2021-11-03 MED ORDER — PREDNISONE 20 MG PO TABS
40.0000 mg | ORAL_TABLET | Freq: Every day | ORAL | 0 refills | Status: DC
Start: 2021-11-03 — End: 2021-11-29
  Filled 2021-11-03: qty 10, 5d supply, fill #0

## 2021-11-03 MED ORDER — ALPRAZOLAM 0.25 MG PO TABS
0.2500 mg | ORAL_TABLET | Freq: Three times a day (TID) | ORAL | 1 refills | Status: DC | PRN
Start: 2021-11-03 — End: 2023-03-07
  Filled 2021-11-03: qty 30, 10d supply, fill #0
  Filled 2022-04-16 – 2022-04-17 (×2): qty 30, 10d supply, fill #1

## 2021-11-03 NOTE — Progress Notes (Signed)
Shirley Fleming is a 46 y.o. female here for a new problem of sinus problem.   History of Present Illness:   Chief Complaint  Patient presents with  . Ear Pain    Bilateral ear pain, feels like liquid is in right ear Has tried OTC drops, Tylenol and Advil with no relief  . Sinus Problem    Started about 3 weeks ago, green mucus in nose     HPI  Bilateral Ear Pain  Patient presents with complain of bilateral ear pain for the 3 weeks. Has tried OTC ear drops and Advil with no relief. Feels like something is inside her ears. She states she has been experiencing sensation in bilateral ears which she described as  "crawling sensation". No other specific treatment tried. Some dizziness. She has tried several medication in the past with no relief. States pain usually wakes her up from her sleep. Denies any significant fever or chills.   Sinus Problem  Patient also complain of sinus congestion that has been onset for the past 3 weeks. Some green nasal discharge. She has been using Ibuprofen and Flonase with some relief. No other specific treatment tried. Denies Denies fever or chills. Denies malaise. Denies cough or headache. No sore throat.   Anxiety  She is currently compliant with taking Xanax 0.25 mg as needed with no complications. States she takes about quarter of this and has found this to be working well for her. She is requesting refill on Xanax today. Denies any worsening sx. She was prescribed prozac 10 mg by [place] recently but has yet to start this. She does admit that she is starting talk therapy soon. Denies SI/HI.  Past Medical History:  Diagnosis Date  . Allergy   . Anxiety   . Asthma   . History of COVID-19 12/23/2020  . Hypoglycemia   . Migraine with aura      Social History   Tobacco Use  . Smoking status: Former    Types: Cigarettes    Quit date: 04/24/2005    Years since quitting: 16.5    Passive exposure: Never  . Smokeless tobacco: Never  Vaping Use  . Vaping Use:  Never used  Substance Use Topics  . Alcohol use: Yes    Alcohol/week: 2.0 - 3.0 standard drinks of alcohol    Types: 2 - 3 Glasses of wine per week  . Drug use: Never    Past Surgical History:  Procedure Laterality Date  . APPENDECTOMY    . TONSILLECTOMY AND ADENOIDECTOMY      Family History  Problem Relation Age of Onset  . COPD Mother   . Depression Mother   . Heart attack Father 59  . Hypertension Father   . Breast cancer Maternal Aunt        dx mid 28s  . Diabetes Maternal Aunt   . Hypertension Maternal Aunt   . Breast cancer Maternal Grandmother        dx 77s, d. 62s  . Heart attack Maternal Grandfather   . Breast cancer Paternal Grandmother        dx 24s; + vaginal cancer 30s, jaw cancer 78s  . Heart attack Paternal Grandfather     Allergies  Allergen Reactions  . Doxycycline Nausea Only  . Erythromycin Nausea And Vomiting    Current Medications:   Current Outpatient Medications:  .  albuterol (VENTOLIN HFA) 108 (90 Base) MCG/ACT inhaler, Inhale into the lungs every 6 (six) hours as needed., Disp: , Rfl:  .  ALPRAZolam (XANAX) 0.25 MG tablet, Take 1 tablet (0.25 mg total) by mouth every 8 (eight) hours as needed., Disp: 30 tablet, Rfl: 2 .  B Complex-Biotin-FA (B-COMPLEX PO), Take 0.5 tablets by mouth daily in the afternoon., Disp: , Rfl:  .  cholecalciferol (VITAMIN D3) 25 MCG (1000 UT) tablet, Take 1,000 Units by mouth daily., Disp: , Rfl:  .  ferrous sulfate 324 MG TBEC, Take 65 mg by mouth., Disp: , Rfl:  .  FLUoxetine (PROZAC) 10 MG capsule, Take 1 capsule (10 mg total) by mouth daily., Disp: 90 capsule, Rfl: 0 .  fluticasone (FLONASE) 50 MCG/ACT nasal spray, Place 1 spray into both nostrils daily., Disp: , Rfl:  .  magnesium 30 MG tablet, Take 30 mg by mouth daily., Disp: , Rfl:  .  Multiple Vitamin (MULTIVITAMIN) tablet, Take 1 tablet by mouth daily., Disp: , Rfl:  .  norethindrone (MICRONOR) 0.35 MG tablet, Take 1 tablet (0.35 mg total) by mouth daily.,  Disp: 84 tablet, Rfl: 3 .  propranolol (INDERAL) 10 MG tablet, TAKE 1 TABLET (10 MG TOTAL) BY MOUTH EVERY 12 (TWELVE) HOURS AS NEEDED., Disp: 60 tablet, Rfl: 0   Review of Systems:   ROS Negative unless otherwise specified per HPI.  Vitals:   Vitals:   11/03/21 1018  BP: 118/74  Pulse: 78  Temp: 97.9 F (36.6 C)  TempSrc: Temporal  SpO2: 97%  Weight: 195 lb 8 oz (88.7 kg)  Height: '5\' 5"'$  (1.651 m)     Body mass index is 32.53 kg/m.  Physical Exam:   Physical Exam Vitals and nursing note reviewed.  Constitutional:      General: She is not in acute distress.    Appearance: She is well-developed. She is not ill-appearing or toxic-appearing.  HENT:     Head: Normocephalic and atraumatic.     Right Ear: Ear canal and external ear normal. A middle ear effusion is present. Tympanic membrane is not erythematous, retracted or bulging.     Left Ear: Ear canal and external ear normal. A middle ear effusion is present. Tympanic membrane is not erythematous, retracted or bulging.     Nose: Nose normal.     Right Sinus: No maxillary sinus tenderness or frontal sinus tenderness.     Left Sinus: No maxillary sinus tenderness or frontal sinus tenderness.     Mouth/Throat:     Pharynx: Uvula midline. No posterior oropharyngeal erythema.  Eyes:     General: Lids are normal.     Conjunctiva/sclera: Conjunctivae normal.  Neck:     Trachea: Trachea normal.  Cardiovascular:     Rate and Rhythm: Normal rate and regular rhythm.     Heart sounds: Normal heart sounds, S1 normal and S2 normal.  Pulmonary:     Effort: Pulmonary effort is normal.     Breath sounds: Normal breath sounds. No decreased breath sounds, wheezing, rhonchi or rales.  Lymphadenopathy:     Cervical: No cervical adenopathy.  Skin:    General: Skin is warm and dry.  Neurological:     Mental Status: She is alert.  Psychiatric:        Speech: Speech normal.        Behavior: Behavior normal. Behavior is cooperative.      Assessment and Plan:   Sinus congestion No red flags Due to chronicity, referral to ENT Do not feel as though abx are warranted at this time We will try prednisone for her ETD, and if this does not improve  congestion, may need to use abs Refuses antihistamines 2/2 constipation side effect  Dysfunction of both eustachian tubes No red flags Due to chronicity, referral to ENT We will try prednisone for her ETD, and if this does not improve congestion, may need to use abs Refuses antihistamines and decongestants 2/2 constipation side effect Follow-up prn  Anxiety Well controlled Continue xanax prn  Encouraged participation in talk therapy Denies SI/HI Follow-up in 6-12 mo  I,Savera Zaman,acting as a scribe for [employer], PA.,have documented all relevant documentation on the behalf of [name], PA,as directed by  [name], PA while in the presence of [name], [place].   I, [name], [place], have reviewed all documentation for this visit. The documentation on 11/03/21 for the exam, diagnosis, procedures, and orders are all accurate and complete.   [name], PA-C

## 2021-11-08 ENCOUNTER — Telehealth: Payer: Self-pay | Admitting: Physician Assistant

## 2021-11-08 ENCOUNTER — Other Ambulatory Visit: Payer: Self-pay | Admitting: Physician Assistant

## 2021-11-08 ENCOUNTER — Encounter: Payer: Self-pay | Admitting: Physician Assistant

## 2021-11-08 ENCOUNTER — Other Ambulatory Visit (HOSPITAL_BASED_OUTPATIENT_CLINIC_OR_DEPARTMENT_OTHER): Payer: Self-pay

## 2021-11-08 ENCOUNTER — Ambulatory Visit
Admission: RE | Admit: 2021-11-08 | Discharge: 2021-11-08 | Disposition: A | Discharge status: Home / Self Care | Payer: BC Managed Care – PPO | Source: Ambulatory Visit | Attending: Obstetrics and Gynecology | Admitting: Obstetrics and Gynecology

## 2021-11-08 DIAGNOSIS — Z1231 Encounter for screening mammogram for malignant neoplasm of breast: Secondary | ICD-10-CM

## 2021-11-08 MED ORDER — AMOXICILLIN-POT CLAVULANATE 875-125 MG PO TABS
1.0000 | ORAL_TABLET | Freq: Two times a day (BID) | ORAL | 0 refills | Status: DC
  Filled 2021-11-08: qty 20, 10d supply, fill #0

## 2021-11-08 NOTE — Telephone Encounter (Signed)
Patient stated she does not need a Pap for her Physical scheduled for 11/29/21 because Patient has a Editor, commissioning

## 2021-11-08 NOTE — Telephone Encounter (Signed)
Noted  

## 2021-11-09 ENCOUNTER — Emergency Department (HOSPITAL_BASED_OUTPATIENT_CLINIC_OR_DEPARTMENT_OTHER): Payer: BC Managed Care – PPO

## 2021-11-09 ENCOUNTER — Telehealth: Payer: Self-pay | Admitting: Physician Assistant

## 2021-11-09 ENCOUNTER — Emergency Department (HOSPITAL_BASED_OUTPATIENT_CLINIC_OR_DEPARTMENT_OTHER)
Admission: EM | Admit: 2021-11-09 | Discharge: 2021-11-09 | Disposition: A | Discharge status: Home / Self Care | Payer: BC Managed Care – PPO | Attending: Emergency Medicine | Admitting: Emergency Medicine

## 2021-11-09 ENCOUNTER — Other Ambulatory Visit: Payer: Self-pay

## 2021-11-09 ENCOUNTER — Encounter (HOSPITAL_BASED_OUTPATIENT_CLINIC_OR_DEPARTMENT_OTHER): Payer: Self-pay | Admitting: Obstetrics and Gynecology

## 2021-11-09 DIAGNOSIS — R109 Unspecified abdominal pain: Secondary | ICD-10-CM | POA: Diagnosis not present

## 2021-11-09 DIAGNOSIS — M549 Dorsalgia, unspecified: Secondary | ICD-10-CM | POA: Diagnosis not present

## 2021-11-09 DIAGNOSIS — R300 Dysuria: Secondary | ICD-10-CM | POA: Diagnosis not present

## 2021-11-09 DIAGNOSIS — N3000 Acute cystitis without hematuria: Secondary | ICD-10-CM | POA: Insufficient documentation

## 2021-11-09 DIAGNOSIS — M545 Low back pain, unspecified: Secondary | ICD-10-CM | POA: Diagnosis not present

## 2021-11-09 DIAGNOSIS — R1010 Upper abdominal pain, unspecified: Secondary | ICD-10-CM | POA: Diagnosis not present

## 2021-11-09 LAB — CBC WITH DIFFERENTIAL/PLATELET
Abs Immature Granulocytes: 0.04 10*3/uL (ref 0.00–0.07)
Basophils Absolute: 0 10*3/uL (ref 0.0–0.1)
Basophils Relative: 0 %
Eosinophils Absolute: 0 10*3/uL (ref 0.0–0.5)
Eosinophils Relative: 0 %
HCT: 43.2 % (ref 36.0–46.0)
Hemoglobin: 14.8 g/dL (ref 12.0–15.0)
Immature Granulocytes: 0 %
Lymphocytes Relative: 11 %
Lymphs Abs: 1.2 10*3/uL (ref 0.7–4.0)
MCH: 30.5 pg (ref 26.0–34.0)
MCHC: 34.3 g/dL (ref 30.0–36.0)
MCV: 88.9 fL (ref 80.0–100.0)
Monocytes Absolute: 0.5 10*3/uL (ref 0.1–1.0)
Monocytes Relative: 5 %
Neutro Abs: 8.7 10*3/uL — ABNORMAL HIGH (ref 1.7–7.7)
Neutrophils Relative %: 84 %
Platelets: 254 10*3/uL (ref 150–400)
RBC: 4.86 MIL/uL (ref 3.87–5.11)
RDW: 11.9 % (ref 11.5–15.5)
WBC: 10.4 10*3/uL (ref 4.0–10.5)
nRBC: 0 % (ref 0.0–0.2)

## 2021-11-09 LAB — URINALYSIS, ROUTINE W REFLEX MICROSCOPIC
Bilirubin Urine: NEGATIVE
Glucose, UA: NEGATIVE mg/dL
Hgb urine dipstick: NEGATIVE
Ketones, ur: NEGATIVE mg/dL
Nitrite: NEGATIVE
Protein, ur: NEGATIVE mg/dL
Specific Gravity, Urine: 1.013 (ref 1.005–1.030)
pH: 6 (ref 5.0–8.0)

## 2021-11-09 LAB — BASIC METABOLIC PANEL
Anion gap: 9 (ref 5–15)
BUN: 16 mg/dL (ref 6–20)
CO2: 27 mmol/L (ref 22–32)
Calcium: 10 mg/dL (ref 8.9–10.3)
Chloride: 103 mmol/L (ref 98–111)
Creatinine, Ser: 0.79 mg/dL (ref 0.44–1.00)
GFR, Estimated: >60 mL/min (ref >60)
Glucose, Bld: 162 mg/dL — ABNORMAL HIGH (ref 70–99)
Potassium: 3.8 mmol/L (ref 3.5–5.1)
Sodium: 139 mmol/L (ref 135–145)

## 2021-11-09 LAB — PREGNANCY, URINE: Preg Test, Ur: NEGATIVE

## 2021-11-09 MED ORDER — CYCLOBENZAPRINE HCL 5 MG PO TABS
5.0000 mg | ORAL_TABLET | Freq: Once | ORAL | Status: AC
Start: 2021-11-09 — End: 2021-11-09
  Administered 2021-11-09: 5 mg via ORAL
  Filled 2021-11-09: qty 1

## 2021-11-09 MED ORDER — KETOROLAC TROMETHAMINE 60 MG/2ML IM SOLN
30.0000 mg | Freq: Once | INTRAMUSCULAR | Status: AC
  Administered 2021-11-09: 30 mg via INTRAMUSCULAR
  Filled 2021-11-09: qty 2

## 2021-11-09 MED ORDER — NAPROXEN 500 MG PO TABS: 500.0000 mg | ORAL_TABLET | Freq: Two times a day (BID) | ORAL | 0 refills | Status: DC

## 2021-11-09 MED ORDER — LIDOCAINE 5 % EX PTCH
1.0000 | MEDICATED_PATCH | CUTANEOUS | 0 refills | Status: DC
Start: 2021-11-09 — End: 2021-11-29

## 2021-11-09 MED ORDER — CEFPODOXIME PROXETIL 200 MG PO TABS: 200.0000 mg | ORAL_TABLET | Freq: Two times a day (BID) | ORAL | 0 refills | Status: AC

## 2021-11-09 MED ORDER — LIDOCAINE 5 % EX PTCH
1.0000 | MEDICATED_PATCH | CUTANEOUS | Status: DC
Start: 2021-11-09 — End: 2021-11-10
  Administered 2021-11-09: 1 via TRANSDERMAL
  Filled 2021-11-09: qty 1

## 2021-11-09 NOTE — ED Provider Notes (Signed)
[facility] EMERGENCY DEPT Provider Note   CSN: [id number] Arrival date & time: 11/09/21  1608     History  Chief Complaint  Patient presents with   Flank Pain    Shirley Fleming is a 46 y.o. female.   Flank Pain Associated symptoms include abdominal pain.    46 year old female presenting to the emergency department with concern for left-sided flank pain.  The patient feels that she is urinating a large amount.  She has been dribbling urine on her close and her urine has been cloudy with some tenderness and pain suprapubically.  She states that she just started Augmentin and took a dose of the medication this morning.  She denies any fevers or chills.  She has had left-sided flank pain.  She denies any injury to her back.  She denies any weakness or numbness.  Home Medications Prior to Admission medications   Medication Sig Start Date End Date Taking? Authorizing Provider  cefpodoxime (VANTIN) 200 MG tablet Take 1 tablet (200 mg total) by mouth 2 (two) times daily for 10 days. 11/09/21 11/19/21 Yes Regan Lemming, MD  lidocaine (LIDODERM) 5 % Place 1 patch onto the skin daily. Remove & Discard patch within 12 hours or as directed by MD 11/09/21  Yes Regan Lemming, MD  naproxen (NAPROSYN) 500 MG tablet Take 1 tablet (500 mg total) by mouth 2 (two) times daily. 11/09/21  Yes Regan Lemming, MD  albuterol (VENTOLIN HFA) 108 (90 Base) MCG/ACT inhaler Inhale into the lungs every 6 (six) hours as needed.    [provider]  ALPRAZolam (XANAX) 0.25 MG tablet Take 1 tablet (0.25 mg total) by mouth every 8 (eight) hours as needed. 11/03/21   [name], PA  B Complex-Biotin-FA (B-COMPLEX PO) Take 0.5 tablets by mouth daily in the afternoon.    [provider]  cholecalciferol (VITAMIN D3) 25 MCG (1000 UT) tablet Take 1,000 Units by mouth daily.    [provider]  ferrous sulfate 324 MG TBEC Take 65 mg by mouth.    [provider]  FLUoxetine  (PROZAC) 10 MG capsule Take 1 capsule (10 mg total) by mouth daily. 05/18/21   [name], PA  fluticasone (FLONASE) 50 MCG/ACT nasal spray Place 1 spray into both nostrils daily.    [provider]  magnesium 30 MG tablet Take 30 mg by mouth daily.    [provider]  Multiple Vitamin (MULTIVITAMIN) tablet Take 1 tablet by mouth daily.    [provider]  norethindrone (MICRONOR) 0.35 MG tablet Take 1 tablet (0.35 mg total) by mouth daily. 08/05/21   Nunzio Cobbs, MD  predniSONE (DELTASONE) 20 MG tablet Take 2 tablets (40 mg total) by mouth daily. 11/03/21   [name], PA  propranolol (INDERAL) 10 MG tablet TAKE 1 TABLET (10 MG TOTAL) BY MOUTH EVERY 12 (TWELVE) HOURS AS NEEDED. 10/19/21   Minus Breeding, MD      Allergies    Doxycycline and Erythromycin    Review of Systems   Review of Systems  Gastrointestinal:  Positive for abdominal pain.  Genitourinary:  Positive for flank pain and frequency.  All other systems reviewed and are negative.   Physical Exam Updated Vital Signs BP 134/88 (BP Location: Right Arm)   Pulse 73   Temp 98.9 F (37.2 C)   Resp 19   LMP 11/03/2021 Comment: started 7 days ago  SpO2 98%  Physical Exam Vitals and nursing note reviewed.  Constitutional:  General: She is not in acute distress.    Appearance: She is well-developed.  HENT:     Head: Normocephalic and atraumatic.  Eyes:     Conjunctiva/sclera: Conjunctivae normal.  Cardiovascular:     Rate and Rhythm: Normal rate and regular rhythm.     Heart sounds: No murmur heard. Pulmonary:     Effort: Pulmonary effort is normal. No respiratory distress.     Breath sounds: Normal breath sounds.  Abdominal:     Palpations: Abdomen is soft.     Tenderness: There is abdominal tenderness in the suprapubic area. There is no right CVA tenderness, left CVA tenderness, guarding or rebound.     Comments: No CVA tenderness, left-sided musculoskeletal  tenderness to palpation on exam.  No rash.  Musculoskeletal:        General: No swelling.     Cervical back: Neck supple.  Skin:    General: Skin is warm and dry.     Capillary Refill: Capillary refill takes less than 2 seconds.  Neurological:     Mental Status: She is alert.  Psychiatric:        Mood and Affect: Mood normal.     ED Results / Procedures / Treatments   Labs (all labs ordered are listed, but only abnormal results are displayed) Labs Reviewed  URINALYSIS, ROUTINE W REFLEX MICROSCOPIC - Abnormal; Notable for the following components:      Result Value   Leukocytes,Ua TRACE (*)    Bacteria, UA RARE (*)    All other components within normal limits  CBC WITH DIFFERENTIAL/PLATELET - Abnormal; Notable for the following components:   Neutro Abs 8.7 (*)    All other components within normal limits  BASIC METABOLIC PANEL - Abnormal; Notable for the following components:   Glucose, Bld 162 (*)    All other components within normal limits  PREGNANCY, URINE    EKG None  Radiology CT Renal Stone Study  Result Date: 11/09/2021 CLINICAL DATA:  Left flank pain. EXAM: CT ABDOMEN AND PELVIS WITHOUT CONTRAST TECHNIQUE: Multidetector CT imaging of the abdomen and pelvis was performed following the standard protocol without IV contrast. RADIATION DOSE REDUCTION: This exam was performed according to the departmental dose-optimization program which includes automated exposure control, adjustment of the mA and/or kV according to patient size and/or use of iterative reconstruction technique. COMPARISON:  None Available. FINDINGS: Lower chest: The lung bases are clear of acute process. No pleural effusion or pulmonary lesions. The heart is normal in size. No pericardial effusion. The distal esophagus and aorta are unremarkable. Hepatobiliary: No hepatic lesions or intrahepatic biliary dilatation. Gallbladder is contracted. No common bile duct dilatation. Pancreas: No mass, inflammation or  ductal dilatation. Spleen: Normal size.  No focal lesions. Adrenals/Urinary Tract: Adrenal glands and kidneys are unremarkable. No renal or obstructing ureteral calculi. No bladder calculi. No worrisome renal or bladder lesions are identified without contrast. Stomach/Bowel: The stomach, duodenum, small bowel and colon are unremarkable. No acute inflammatory process, mass lesions or obstructive findings. The terminal ileum is unremarkable. The appendix is surgically absent. No findings for colonic diverticulosis or acute diverticulitis. Vascular/Lymphatic: The aorta is normal in caliber. No atheroscerlotic calcifications. No mesenteric of retroperitoneal mass or adenopathy. Small scattered lymph nodes are noted. Reproductive: The uterus and ovaries are unremarkable. Other: No pelvic mass or adenopathy. No free pelvic fluid collections. No inguinal mass or adenopathy. Small periumbilical abdominal wall hernia containing. Musculoskeletal: No significant bony findings. IMPRESSION: 1. No renal or obstructing ureteral calculi.  2. No acute abdominal/pelvic findings, mass lesions or adenopathy. 3. Small periumbilical abdominal wall hernia containing fat. Electronically Signed   By: Marijo Sanes M.D.   On: 11/09/2021 17:41    Procedures Procedures    Medications Ordered in ED Medications  ketorolac (TORADOL) injection 30 mg (has no administration in time range)  lidocaine (LIDODERM) 5 % 1 patch (1 patch Transdermal Patch Applied 11/09/21 1914)  cyclobenzaprine (FLEXERIL) tablet 5 mg (5 mg Oral Given 11/09/21 1913)    ED Course/ Medical Decision Making/ A&P                           Medical Decision Making Amount and/or Complexity of Data Reviewed Labs: ordered. Radiology: ordered.  Risk Prescription drug management.   46 year old female presenting to the emergency department with concern for left-sided flank pain.  The patient feels that she is urinating a large amount.  She has been dribbling urine  on her close and her urine has been cloudy with some tenderness and pain suprapubically.  She states that she just started Augmentin and took a dose of the medication this morning.  She denies any fevers or chills.  She has had left-sided flank pain.  She denies any injury to her back.  She denies any weakness or numbness.  On arrival, the patient was vitally stable.  Presenting with increased urinary frequency, back pain, has suprapubic tenderness on exam.  Differential diagnosis includes UTI, pyelonephritis, nephrolithiasis, musculoskeletal back pain.    Urinalysis was unconvincing for UTI although the patient did take a course of antibiotics earlier today.  CBC without a leukocytosis or anemia, BMP without renal dysfunction, urine pregnancy negative.  Urinalysis showed trace leukocytes, rare bacteria, negative nitrites, 0-5 WBCs.  A CT renal stone study was performed which resulted negative for acute abnormalities.  The patient possibly has UTI with associated musculoskeletal back pain.  Alternatively, she could have a UTI with developing early pyelonephritis although no evidence of pyelonephritis seen on CT and the patient is vitally stable with a normal white blood cell count.  We will go ahead and treat the patient for early pyelonephritis with a 10-day course of Vantin.  Additionally, due to concern for possible musculoskeletal back pain on exam, will treat with lidocaine patch, high-dose NSAIDs for the next few days.  No red flag back pain symptoms.  Overall stable at time of discharge for PCP follow-up.   Final Clinical Impression(s) / ED Diagnoses Final diagnoses:  Acute cystitis without hematuria  Musculoskeletal back pain    Rx / DC Orders ED Discharge Orders          Ordered    cefpodoxime (VANTIN) 200 MG tablet  2 times daily        11/09/21 1909    lidocaine (LIDODERM) 5 %  Every 24 hours        11/09/21 1909    naproxen (NAPROSYN) 500 MG tablet  2 times daily        11/09/21  1909              Regan Lemming, MD 11/09/21 [phone]

## 2021-11-09 NOTE — Telephone Encounter (Signed)
Pt states: -frequent urge to urinate, started this morning (07/19) -within last hour (approximately 12:30 pm), pain started on left side of abdomen, getting worse.   Pt warm referred to [facility], patient coordinator for PCP triage nurse. Awaiting follow up notes.

## 2021-11-09 NOTE — Telephone Encounter (Signed)
Go to ED Now   Patient Name: Shirley Fleming Gender: Female DOB: [birth year] Age: 46 Y 10 M 12 D Return Phone Number: [phone] (Primary) Address: City/ State/ Zip: [place]  [address] Client [facility] Client Site [facility] [place] [facility] Provider Morene Rankins, [place]- PA Contact Type Call Who Is Calling Patient / Member / Family / Caregiver Call Type Triage / Clinical Relationship To Patient Self Return Phone Number [phone] (Primary) Chief Complaint Flank Pain Reason for Call Symptomatic / Request for [place] states that she woke up with urination frequency. She is also c/o of left flank pain that is worsening. Translation No Nurse Assessment Nurse: Velta Addison, RN, [place] Date/Time (Eastern Time): 11/09/2021 1:43:59 PM Confirm and document reason for call. If symptomatic, describe symptoms. ---Caller states that she woke up with urination frequency. She is also c/o of left flank pain that is worsening. Left abd pain started approx 1 hour ago, has now gone up left side and is now in left back between rib cage and hip bone. Woke up with odd abdominal pain, thought she was hungry but this pain started all of a sudden. No fever. States she is taking Augmentin currently for ear infection, just started today. Does the patient have any new or worsening symptoms? ---Yes Will a triage be completed? ---Yes Related visit to physician within the last 2 weeks? ---Yes Does the PT have any chronic conditions? (i.e. diabetes, asthma, this includes High risk factors for pregnancy, etc.) ---No Is the patient pregnant or possibly pregnant? (Ask all females between the ages of 20-55) ---No Is this a behavioral health or substance abuse call? ---No  Guidelines Guideline Title Affirmed Question Affirmed Notes Nurse Date/Time (Eastern Time) Flank Pain Patient sounds very sick or weak to  the triager Velta Addison, RN, [place] 11/09/2021 1:46:40 PM Disp. Time Eilene Ghazi Time) Disposition Final User 11/09/2021 1:50:39 PM Go to ED Now (or PCP triage) Yes Velta Addison, RN, [place] Final Disposition 11/09/2021 1:50:39 PM Go to ED Now (or PCP triage) Yes Velta Addison, RN, [place] Disagree/Comply Comply Caller Understands Yes PreDisposition Call Doctor Care Advice Given Per Guideline GO TO ED NOW (OR PCP TRIAGE): * IF NO PCP (PRIMARY CARE PROVIDER) SECOND-LEVEL TRIAGE: You need to be seen within the next hour. Go to the [facility] at _____________ [place] as soon as you can. ANOTHER ADULT SHOULD DRIVE: * It is better and safer if another adult drives instead of you. CARE ADVICE given per Flank Pain (Adult) guideline. Comments User: Hamilton Capri, RN Date/Time (Eastern Time): 11/09/2021 1:47:40 PM States she is urinating a large amt, feels she is emptying but she stood earlier after urinating and she dribbled urine in her clothes. States her urine is cloudy, no red color to it. User: Hamilton Capri, RN Date/Time Eilene Ghazi Time): 11/09/2021 1:51:57 PM Pain is moderate, leaning to severe at this time. Caller states she is feeling a pulsating sensation in her lower abdomen, or possibly a tingling sensation. States over the past few mins the pain is getting worse and has had sensation that she was dizzy and nauseated. Referrals GO TO FACILITY UNDECIDED

## 2021-11-09 NOTE — Discharge Instructions (Signed)
Your urinalysis was not convincing for urinary tract infection however your symptoms and physical exam are concerning for developing cystitis with increased frequency and suprapubic tenderness.  Given your single dose of antibiotic this morning, it is possible this may be slightly sterilized.  We will treat you for urinary tract infection and developing kidney infection with a 10-day course of cefpodoxime 200 mg p.o. twice daily.  Additionally, you do have some musculoskeletal back pain on exam which we will treat with a lidocaine patch and high-dose NSAIDs for the next few days.  If you develop a rash on your left back, this would be concerning for shingles, recommend you call your PCP for treatment.

## 2021-11-09 NOTE — ED Triage Notes (Signed)
Patient reports to the ER for left sided flank pain. Patient reports the pain is constant and comes in waves. Concern for kidney stone.

## 2021-11-09 NOTE — Telephone Encounter (Signed)
See Triage note, pt is in the ED.

## 2021-11-13 ENCOUNTER — Other Ambulatory Visit: Payer: Self-pay | Admitting: Cardiology

## 2021-11-29 ENCOUNTER — Other Ambulatory Visit (HOSPITAL_BASED_OUTPATIENT_CLINIC_OR_DEPARTMENT_OTHER): Payer: Self-pay

## 2021-11-29 ENCOUNTER — Encounter: Payer: Self-pay | Admitting: Physician Assistant

## 2021-11-29 ENCOUNTER — Ambulatory Visit (INDEPENDENT_AMBULATORY_CARE_PROVIDER_SITE_OTHER): Payer: BC Managed Care – PPO | Admitting: Physician Assistant

## 2021-11-29 VITALS — BP 122/86 | HR 68 | Temp 98.3°F | Ht 65.25 in | Wt 194.2 lb

## 2021-11-29 DIAGNOSIS — R109 Unspecified abdominal pain: Secondary | ICD-10-CM | POA: Diagnosis not present

## 2021-11-29 DIAGNOSIS — Z0001 Encounter for general adult medical examination with abnormal findings: Secondary | ICD-10-CM

## 2021-11-29 DIAGNOSIS — F419 Anxiety disorder, unspecified: Secondary | ICD-10-CM

## 2021-11-29 DIAGNOSIS — E669 Obesity, unspecified: Secondary | ICD-10-CM | POA: Diagnosis not present

## 2021-11-29 DIAGNOSIS — H6983 Other specified disorders of Eustachian tube, bilateral: Secondary | ICD-10-CM | POA: Diagnosis not present

## 2021-11-29 DIAGNOSIS — Z1159 Encounter for screening for other viral diseases: Secondary | ICD-10-CM

## 2021-11-29 DIAGNOSIS — Z114 Encounter for screening for human immunodeficiency virus [HIV]: Secondary | ICD-10-CM | POA: Diagnosis not present

## 2021-11-29 LAB — URINALYSIS, ROUTINE W REFLEX MICROSCOPIC
Bilirubin Urine: NEGATIVE
Hgb urine dipstick: NEGATIVE
Ketones, ur: NEGATIVE
Leukocytes,Ua: NEGATIVE
Nitrite: NEGATIVE
RBC / HPF: NONE SEEN (ref 0–?)
Specific Gravity, Urine: <=1.005 — AB (ref 1.000–1.030)
Total Protein, Urine: NEGATIVE
Urine Glucose: NEGATIVE
Urobilinogen, UA: 0.2 (ref 0.0–1.0)
WBC, UA: NONE SEEN (ref 0–?)
pH: 6.5 (ref 5.0–8.0)

## 2021-11-29 LAB — CBC WITH DIFFERENTIAL/PLATELET
Basophils Absolute: 0 10*3/uL (ref 0.0–0.1)
Basophils Relative: 0.8 % (ref 0.0–3.0)
Eosinophils Absolute: 0.4 10*3/uL (ref 0.0–0.7)
Eosinophils Relative: 6.4 % — ABNORMAL HIGH (ref 0.0–5.0)
HCT: 41.9 % (ref 36.0–46.0)
Hemoglobin: 14.3 g/dL (ref 12.0–15.0)
Lymphocytes Relative: 17 % (ref 12.0–46.0)
Lymphs Abs: 1.1 10*3/uL (ref 0.7–4.0)
MCHC: 34.1 g/dL (ref 30.0–36.0)
MCV: 89.4 fl (ref 78.0–100.0)
Monocytes Absolute: 0.5 10*3/uL (ref 0.1–1.0)
Monocytes Relative: 7.9 % (ref 3.0–12.0)
Neutro Abs: 4.2 10*3/uL (ref 1.4–7.7)
Neutrophils Relative %: 67.9 % (ref 43.0–77.0)
Platelets: 181 10*3/uL (ref 150.0–400.0)
RBC: 4.69 Mil/uL (ref 3.87–5.11)
RDW: 12.7 % (ref 11.5–15.5)
WBC: 6.2 10*3/uL (ref 4.0–10.5)

## 2021-11-29 LAB — COMPREHENSIVE METABOLIC PANEL
ALT: 18 U/L (ref 0–35)
AST: 21 U/L (ref 0–37)
Albumin: 4.3 g/dL (ref 3.5–5.2)
Alkaline Phosphatase: 52 U/L (ref 39–117)
BUN: 12 mg/dL (ref 6–23)
CO2: 28 mEq/L (ref 19–32)
Calcium: 9.4 mg/dL (ref 8.4–10.5)
Chloride: 103 mEq/L (ref 96–112)
Creatinine, Ser: 0.69 mg/dL (ref 0.40–1.20)
GFR: 104.45 mL/min (ref >60.00)
Glucose, Bld: 88 mg/dL (ref 70–99)
Potassium: 4.1 mEq/L (ref 3.5–5.1)
Sodium: 141 mEq/L (ref 135–145)
Total Bilirubin: 0.4 mg/dL (ref 0.2–1.2)
Total Protein: 7 g/dL (ref 6.0–8.3)

## 2021-11-29 LAB — LIPID PANEL
Cholesterol: 173 mg/dL (ref 0–200)
HDL: 47.2 mg/dL (ref >39.00)
LDL Cholesterol: 104 mg/dL — ABNORMAL HIGH (ref 0–99)
NonHDL: 126.28
Total CHOL/HDL Ratio: 4
Triglycerides: 113 mg/dL (ref 0.0–149.0)
VLDL: 22.6 mg/dL (ref 0.0–40.0)

## 2021-11-29 MED ORDER — ALBUTEROL SULFATE HFA 108 (90 BASE) MCG/ACT IN AERS
1.0000 | INHALATION_SPRAY | Freq: Four times a day (QID) | RESPIRATORY_TRACT | 2 refills | Status: AC | PRN
  Filled 2021-11-29: qty 6.7, 25d supply, fill #0

## 2021-11-29 NOTE — Progress Notes (Signed)
Subjective:    Shirley Fleming is a 46 y.o. female and is here for a comprehensive physical exam.   HPI  Health Maintenance Due  Topic Date Due   HIV Screening  Never done   Hepatitis C Screening  Never done   COLONOSCOPY (Pts 45-8yr Insurance coverage will need to be confirmed)  Never done   INFLUENZA VACCINE  11/22/2021    Acute Concerns: Left-sided flank pain  Patient states she has had some left sided flank pain about month ago. She went to urgent care and was advised to ED. Has had back pain as well. She presented to ED on 11/09/2021. She was urinating a lot at that time. Has had work-up in the ED. Urinalysis was negative for UTI. CBC without a leukocytosis or anemia, BMP without renal dysfunction, urine pregnancy negative. Urinalysis showed trace leukocytes, rare bacteria, negative nitrites, 0-5 WBCs. She has CT renal stone study which was negative. She was prescribed 10 day course of Vantin for early pyelonephritis and was discharged home. She was also given Lidocaine patch and NSAIDs for back pain.   Today, she states she has finished her antibiotics. She states that about 2 weeks ago she feels she may have passed a stone as she felt something drop in the toilet and had relief of sx afterwards. However, she states she still has been having some pain. She described this as "pelvic fullness. She has been doing well otherwise. She would like to get urinalysis done today. Denies fever or chills. No weakness or numbness. She is currently on her period.  Chronic Issues: Bilateral Ear Pain Patient still has dealing with bilateral ear pain everyday. States she has made appointment with ENT. Denies any other concerns. Denies any significant fever, chills, LAD.   Anxiety  She is currently compliant with taking Xanax 0.25 mg as needed with no complications. States she takes about quarter of this and denies any other concerns. This has been working well for her. Denies  SI/HI.  Allergies Patient is requesting refill on albuterol inhaler today. States she does feel wheezy in winter mornings. Denies any other concerns.  Health Maintenance: Immunizations -- UTD Colonoscopy -- DUE Mammogram -- UTD PAP -- UTD Bone Density -- N/A Diet -- balanced diet  Sleep habits -- No concern  Exercise -- Working out Weight -- 194 Ib (88.1 kg) Mood -- Stable Weight history: Wt Readings from Last 10 Encounters:  11/29/21 194 lb 4 oz (88.1 kg)  11/03/21 195 lb 8 oz (88.7 kg)  10/31/21 196 lb 3.2 oz (89 kg)  09/27/21 194 lb 3.2 oz (88.1 kg)  08/23/21 192 lb (87.1 kg)  08/09/21 194 lb 3.2 oz (88.1 kg)  08/05/21 192 lb (87.1 kg)  05/18/21 191 lb 6.1 oz (86.8 kg)  04/04/21 188 lb 9.6 oz (85.5 kg)  03/09/21 187 lb 4 oz (84.9 kg)   Body mass index is 32.08 kg/m. Patient's last menstrual period was 11/26/2021. Alcohol use:  reports current alcohol use of about 2.0 - 3.0 standard drinks of alcohol per week. Tobacco use: None Tobacco Use: Medium Risk (11/29/2021)   Patient History    Smoking Tobacco Use: Former    Smokeless Tobacco Use: Never    Passive Exposure: Never        11/03/2021   10:28 AM  Depression screen PHQ 2/9  Decreased Interest 0  Down, Depressed, Hopeless 1  PHQ - 2 Score 1  Altered sleeping 0  Tired, decreased energy 1  Change  in appetite 1  Feeling bad or failure about yourself  1  Trouble concentrating 1  Moving slowly or fidgety/restless 0  Suicidal thoughts 0  PHQ-9 Score 5  Difficult doing work/chores Not difficult at all     Other providers/specialists: Patient Care Team: [name], [place] as PCP - General (Physician Assistant)    PMHx, SurgHx, SocialHx, Medications, and Allergies were reviewed in the Visit Navigator and updated as appropriate.   Past Medical History:  Diagnosis Date   Allergy    Anxiety    Asthma    History of COVID-19 12/23/2020   Hypoglycemia    Migraine with aura      Past Surgical History:   Procedure Laterality Date   APPENDECTOMY     TONSILLECTOMY AND ADENOIDECTOMY       Family History  Problem Relation Age of Onset   COPD Mother    Depression Mother    Heart attack Father 92   Hypertension Father    Breast cancer Maternal Aunt        dx mid 44s   Diabetes Maternal Aunt    Hypertension Maternal Aunt    Breast cancer Maternal Grandmother        dx 38s, d. 57s   Heart attack Maternal Grandfather    Breast cancer Paternal Grandmother        dx 50s; + vaginal cancer 60s, jaw cancer 2s   Heart attack Paternal Grandfather     Social History   Tobacco Use   Smoking status: Former    Types: Cigarettes    Quit date: 04/24/2005    Years since quitting: 16.6    Passive exposure: Never   Smokeless tobacco: Never  Vaping Use   Vaping Use: Never used  Substance Use Topics   Alcohol use: Yes    Alcohol/week: 2.0 - 3.0 standard drinks of alcohol    Types: 2 - 3 Glasses of wine per week   Drug use: Never    Review of Systems:   Review of Systems  Constitutional:  Negative for chills, fever, malaise/fatigue and weight loss.  HENT:  Negative for hearing loss, sinus pain and sore throat.   Respiratory:  Negative for cough and hemoptysis.   Cardiovascular:  Negative for chest pain, palpitations, leg swelling and PND.  Gastrointestinal:  Negative for abdominal pain, constipation, diarrhea, heartburn, nausea and vomiting.  Genitourinary:  Negative for dysuria, frequency and urgency.  Musculoskeletal:  Negative for back pain, myalgias and neck pain.  Skin:  Negative for itching and rash.  Neurological:  Negative for dizziness, tingling, seizures and headaches.  Endo/Heme/Allergies:  Negative for polydipsia.  Psychiatric/Behavioral:  Negative for depression. The patient is not nervous/anxious.     Objective:   BP 122/86 (BP Location: Left Arm, Patient Position: Sitting, Cuff Size: Large)   Pulse 68   Temp 98.3 F (36.8 C) (Temporal)   Ht 5' 5.25" (1.657 m)   Wt  194 lb 4 oz (88.1 kg)   LMP 11/26/2021   SpO2 97%   BMI 32.08 kg/m  Body mass index is 32.08 kg/m.   General Appearance:    Alert, cooperative, no distress, appears stated age  Head:    Normocephalic, without obvious abnormality, atraumatic  Eyes:    PERRL, conjunctiva/corneas clear, EOM's intact, fundi    benign, both eyes  Ears:    Normal TM's and external ear canals, both ears  Nose:   Nares normal, septum midline, mucosa normal, no drainage    or  sinus tenderness  Throat:   Lips, mucosa, and tongue normal; teeth and gums normal  Neck:   Supple, symmetrical, trachea midline, no adenopathy;    thyroid:  no enlargement/tenderness/nodules; no carotid   bruit or JVD  Back:     Symmetric, no curvature, ROM normal, no CVA tenderness  Lungs:     Clear to auscultation bilaterally, respirations unlabored  Chest Wall:    No tenderness or deformity   Heart:    Regular rate and rhythm, S1 and S2 normal, no murmur, rub or gallop  Breast Exam:    Deferred  Abdomen:     Soft, non-tender, bowel sounds active all four quadrants,    no masses, no organomegaly  Genitalia:    Deferred  Extremities:   Extremities normal, atraumatic, no cyanosis or edema  Pulses:   2+ and symmetric all extremities  Skin:   Skin color, texture, turgor normal, no rashes or lesions  Lymph nodes:   Cervical, supraclavicular, and axillary nodes normal  Neurologic:   CNII-XII intact, normal strength, sensation and reflexes    throughout    Assessment/Plan:   Encounter for general adult medical examination with abnormal findings Today patient counseled on age appropriate routine health concerns for screening and prevention, each reviewed and up to date or declined. Immunizations reviewed and up to date or declined. Labs ordered and reviewed. Risk factors for depression reviewed and negative. Hearing function and visual acuity are intact. ADLs screened and addressed as needed. Functional ability and level of safety  reviewed and appropriate. Education, counseling and referrals performed based on assessed risks today. Patient provided with a copy of personalized plan for preventive services.  Dysfunction of both eustachian tubes No red flags requiring intervention Continue plan to see ENT  Flank pain Repeat UA per patient request ER visit reviewed with patient If new/worsening symptoms, recommend follow-up in our office  Encounter for screening for other viral diseases Update hep C  Screening for HIV (human immunodeficiency virus) Update HIV screening  Obesity, unspecified classification, unspecified obesity type, unspecified whether serious comorbidity present Continue to work on healthy diet and exercise as able  Anxiety Uncontrolled Denies SI/HI Continue with plan to start prozac 10 mg daily  Patient Counseling: '[x]'$    Nutrition: Stressed importance of moderation in sodium/caffeine intake, saturated fat and cholesterol, caloric balance, sufficient intake of fresh fruits, vegetables, fiber, calcium, iron, and 1 mg of folate supplement per day (for females capable of pregnancy).  '[x]'$    Stressed the importance of regular exercise.   '[x]'$    Substance Abuse: Discussed cessation/primary prevention of tobacco, alcohol, or other drug use; driving or other dangerous activities under the influence; availability of treatment for abuse.   '[x]'$    Injury prevention: Discussed safety belts, safety helmets, smoke detector, smoking near bedding or upholstery.   '[x]'$    Sexuality: Discussed sexually transmitted diseases, partner selection, use of condoms, avoidance of unintended pregnancy  and contraceptive alternatives.  '[x]'$    Dental health: Discussed importance of regular tooth brushing, flossing, and dental visits.  '[x]'$    Health maintenance and immunizations reviewed. Please refer to Health maintenance section.    I,Savera Zaman,acting as a Education administrator for [employer], PA.,have documented all relevant  documentation on the behalf of [name], PA,as directed by  [name], PA while in the presence of [name], [place].   I, [name], [place], have reviewed all documentation for this visit. The documentation on 11/29/21 for the exam, diagnosis, procedures, and orders are all accurate  and complete.   [name], PA-C [place]

## 2021-11-29 NOTE — Patient Instructions (Addendum)
It was great to see you!  Please go to the lab for blood work.   Send me a Pharmacist, community message about 1 month in to the prozac to let me know how its going!  Our office will call you with your results unless you have chosen to receive results via MyChart.  If your blood work is normal we will follow-up each year for physicals and as scheduled for chronic medical problems.  If anything is abnormal we will treat accordingly and get you in for a follow-up.  Take care,  Aldona Bar

## 2021-11-30 LAB — HIV ANTIBODY (ROUTINE TESTING W REFLEX): HIV 1&2 Ab, 4th Generation: NONREACTIVE

## 2021-11-30 LAB — HEPATITIS C ANTIBODY: Hepatitis C Ab: NONREACTIVE

## 2021-12-09 ENCOUNTER — Other Ambulatory Visit: Payer: Self-pay | Admitting: Cardiology

## 2021-12-20 ENCOUNTER — Other Ambulatory Visit (HOSPITAL_BASED_OUTPATIENT_CLINIC_OR_DEPARTMENT_OTHER): Payer: Self-pay

## 2021-12-28 ENCOUNTER — Other Ambulatory Visit (HOSPITAL_BASED_OUTPATIENT_CLINIC_OR_DEPARTMENT_OTHER): Payer: Self-pay

## 2021-12-29 ENCOUNTER — Other Ambulatory Visit (HOSPITAL_BASED_OUTPATIENT_CLINIC_OR_DEPARTMENT_OTHER): Payer: Self-pay

## 2021-12-29 DIAGNOSIS — R350 Frequency of micturition: Secondary | ICD-10-CM | POA: Diagnosis not present

## 2021-12-29 MED ORDER — NITROFURANTOIN MONOHYD MACRO 100 MG PO CAPS
100.0000 mg | ORAL_CAPSULE | Freq: Two times a day (BID) | ORAL | 0 refills | Status: DC
  Filled 2021-12-29: qty 14, 7d supply, fill #0

## 2021-12-29 MED ORDER — PHENAZOPYRIDINE HCL 200 MG PO TABS
200.0000 mg | ORAL_TABLET | Freq: Three times a day (TID) | ORAL | 0 refills | Status: DC | PRN
  Filled 2021-12-29: qty 9, 3d supply, fill #0

## 2021-12-30 ENCOUNTER — Ambulatory Visit: Payer: BC Managed Care – PPO | Admitting: Physician Assistant

## 2022-01-04 ENCOUNTER — Ambulatory Visit (INDEPENDENT_AMBULATORY_CARE_PROVIDER_SITE_OTHER)
Admission: RE | Admit: 2022-01-04 | Discharge: 2022-01-04 | Disposition: A | Discharge status: Home / Self Care | Payer: BC Managed Care – PPO | Source: Ambulatory Visit | Attending: Physician Assistant | Admitting: Physician Assistant

## 2022-01-04 ENCOUNTER — Ambulatory Visit: Payer: BC Managed Care – PPO | Admitting: Physician Assistant

## 2022-01-04 ENCOUNTER — Encounter: Payer: Self-pay | Admitting: Physician Assistant

## 2022-01-04 VITALS — BP 124/80 | HR 73 | Temp 98.0°F | Ht 65.25 in | Wt 194.4 lb

## 2022-01-04 DIAGNOSIS — F32A Depression, unspecified: Secondary | ICD-10-CM

## 2022-01-04 DIAGNOSIS — R1030 Lower abdominal pain, unspecified: Secondary | ICD-10-CM

## 2022-01-04 DIAGNOSIS — F419 Anxiety disorder, unspecified: Secondary | ICD-10-CM | POA: Diagnosis not present

## 2022-01-04 LAB — URINALYSIS, ROUTINE W REFLEX MICROSCOPIC
Bilirubin Urine: NEGATIVE
Hgb urine dipstick: NEGATIVE
Ketones, ur: NEGATIVE
Leukocytes,Ua: NEGATIVE
Nitrite: NEGATIVE
RBC / HPF: NONE SEEN (ref 0–?)
Specific Gravity, Urine: 1.015 (ref 1.000–1.030)
Total Protein, Urine: NEGATIVE
Urine Glucose: NEGATIVE
Urobilinogen, UA: 0.2 (ref 0.0–1.0)
WBC, UA: NONE SEEN (ref 0–?)
pH: 7.5 (ref 5.0–8.0)

## 2022-01-04 NOTE — Patient Instructions (Signed)
It was great to see you!  We will check your urine today  An order for an xray has been put in for you. To get your xray, you can walk in at the [facility] location without a scheduled appointment.  The address is [address]. It is across the street from [place] is located in the basement.  Hours of operation are M-F 8:30am to 5:00pm. Please note that they are closed for lunch between 12:30 and 1:00pm.  I'll be in touch with the results!  Take care,  [name] PA-C

## 2022-01-04 NOTE — Progress Notes (Signed)
Shirley Fleming is a 46 y.o. female here for a follow up of a pre-existing problem.  History of Present Illness:   Chief Complaint  Patient presents with   Abdominal Pain    Pt c/o lower abdominal pain started on 9/2, frequency and pressure with urination. She went to Urgent care on 9/7 UA and culture done both were negative. Also c/o bloating.    HPI  Abdominal pain Patient reports abdominal pain since 9/2. She is having frequency and pressure with urination. She went to an urgent care on 9/7 and negative testing and culture.  She continues to have significant urgency. She gets twitching sensation in her lower abdomen at times. No overt rectal bleeding, but does have some slight bleeding on the toilet tissue.   Bowels are a little different since prozac. Does have history of constipation. She is having some looser stools, but not diarrhea, since the prozac.   Takes culturelle and Olli fiber gummies.   Period was on the 31st and lasted two days. Had spotting 1-2 weeks later.   Has had CT abd pel in July and vaginal u/s in May. We reviewed both of these.  Denies vaginal discharge.   Depression/Anxiety She is doing well with prozac 10 mg daily. Tolerating well. It could be causing her GI symptoms but she reports that she still feels that this is helpful for her mood if this is the case. Mother recently passed. Denies SI/HI.     Past Medical History:  Diagnosis Date   Allergy    Anxiety    Asthma    Depression 1995   Off and on since teens   History of COVID-19 12/23/2020   Hypertension Jan 2021   Hypoglycemia    Migraine with aura      Social History   Tobacco Use   Smoking status: Former    Years: 15.00    Types: Cigarettes    Quit date: 04/24/2005    Years since quitting: 16.7    Passive exposure: Never   Smokeless tobacco: Never  Vaping Use   Vaping Use: Never used  Substance Use Topics   Alcohol use: Yes    Alcohol/week: 2.0 standard drinks of alcohol     Types: 2 Glasses of wine per week   Drug use: Never    Past Surgical History:  Procedure Laterality Date   APPENDECTOMY     TONSILLECTOMY AND ADENOIDECTOMY      Family History  Problem Relation Age of Onset   COPD Mother    Depression Mother    Heart attack Father 5   Hypertension Father    Heart disease Father    Breast cancer Maternal Grandmother        dx 59s, d. 59s   Cancer Maternal Grandmother    Heart attack Maternal Grandfather    Heart disease Maternal Grandfather    Breast cancer Paternal Grandmother        dx 54s; + vaginal cancer 42s, jaw cancer 83s   Cancer Paternal Grandmother    Heart attack Paternal Grandfather    Breast cancer Maternal Aunt        dx mid 38s   Diabetes Maternal Aunt    Hypertension Maternal Aunt    Cancer Maternal Aunt    Depression Maternal Aunt    Hypertension Maternal Aunt    ADD / ADHD Son    Other Son        hypermobile EDS    Allergies  Allergen Reactions  Doxycycline Nausea Only   Erythromycin Nausea And Vomiting    Current Medications:   Current Outpatient Medications:    albuterol (VENTOLIN HFA) 108 (90 Base) MCG/ACT inhaler, Inhale 1-2 puffs into the lungs every 6 (six) hours as needed., Disp: 6.7 g, Rfl: 2   ALPRAZolam (XANAX) 0.25 MG tablet, Take 1 tablet (0.25 mg total) by mouth every 8 (eight) hours as needed., Disp: 30 tablet, Rfl: 1   B Complex-Biotin-FA (B-COMPLEX PO), Take 0.5 tablets by mouth daily in the afternoon., Disp: , Rfl:    cholecalciferol (VITAMIN D3) 25 MCG (1000 UT) tablet, Take 1,000 Units by mouth daily., Disp: , Rfl:    ferrous sulfate 324 MG TBEC, Take 65 mg by mouth., Disp: , Rfl:    FLUoxetine (PROZAC) 10 MG capsule, Take 1 capsule (10 mg total) by mouth daily., Disp: 90 capsule, Rfl: 0   fluticasone (FLONASE) 50 MCG/ACT nasal spray, Place 1 spray into both nostrils daily., Disp: , Rfl:    magnesium 30 MG tablet, Take 30 mg by mouth daily., Disp: , Rfl:    Multiple Vitamin (MULTIVITAMIN)  tablet, Take 1 tablet by mouth daily., Disp: , Rfl:    norethindrone (MICRONOR) 0.35 MG tablet, Take 1 tablet (0.35 mg total) by mouth daily., Disp: 84 tablet, Rfl: 3   propranolol (INDERAL) 10 MG tablet, TAKE 1 TABLET BY MOUTH EVERY 12 HOURS AS NEEDED, Disp: 60 tablet, Rfl: 0   Review of Systems:   ROS Negative unless otherwise specified per HPI.  Vitals:   Vitals:   01/04/22 0939  BP: 124/80  Pulse: 73  Temp: 98 F (36.7 C)  TempSrc: Temporal  SpO2: 98%  Weight: 194 lb 6.1 oz (88.2 kg)  Height: 5' 5.25" (1.657 m)     Body mass index is 32.1 kg/m.  Physical Exam:   Physical Exam Vitals and nursing note reviewed.  Constitutional:      General: She is not in acute distress.    Appearance: She is well-developed. She is not ill-appearing or toxic-appearing.  Cardiovascular:     Rate and Rhythm: Normal rate and regular rhythm.     Pulses: Normal pulses.     Heart sounds: Normal heart sounds, S1 normal and S2 normal.  Pulmonary:     Effort: Pulmonary effort is normal.     Breath sounds: Normal breath sounds.  Abdominal:     General: Abdomen is flat. Bowel sounds are normal.     Palpations: Abdomen is soft.     Tenderness: There is abdominal tenderness in the right lower quadrant. There is no right CVA tenderness or left CVA tenderness.  Skin:    General: Skin is warm and dry.  Neurological:     Mental Status: She is alert.     GCS: GCS eye subscore is 4. GCS verbal subscore is 5. GCS motor subscore is 6.  Psychiatric:        Speech: Speech normal.        Behavior: Behavior normal. Behavior is cooperative.     Assessment and Plan:   Lower abdominal pain No red flags on exam No evidence of acute abdomen Suspect possible constipation vs IBS vs medication side effect Will perform send out UA and cultlure and obtain abd xray Follow-up if new/worsening sx Consider GI referral  Anxiety; Depression, unspecified depression type Improving per patient Continue prozac  10 mg daily Follow-up in 3-6 months I discussed with patient that if they develop any SI, to tell someone immediately and seek  medical attention.   [name], PA-C

## 2022-01-05 LAB — URINE CULTURE
MICRO NUMBER:: 13912000
SPECIMEN QUALITY:: ADEQUATE

## 2022-01-06 ENCOUNTER — Other Ambulatory Visit: Payer: Self-pay | Admitting: Cardiology

## 2022-01-16 ENCOUNTER — Other Ambulatory Visit (HOSPITAL_BASED_OUTPATIENT_CLINIC_OR_DEPARTMENT_OTHER): Payer: Self-pay

## 2022-01-24 ENCOUNTER — Other Ambulatory Visit (HOSPITAL_BASED_OUTPATIENT_CLINIC_OR_DEPARTMENT_OTHER): Payer: Self-pay

## 2022-01-24 ENCOUNTER — Ambulatory Visit: Payer: BC Managed Care – PPO | Admitting: Physician Assistant

## 2022-01-24 ENCOUNTER — Encounter: Payer: Self-pay | Admitting: Physician Assistant

## 2022-01-24 VITALS — BP 118/78 | HR 69 | Temp 97.7°F | Resp 16

## 2022-01-24 DIAGNOSIS — F419 Anxiety disorder, unspecified: Secondary | ICD-10-CM | POA: Diagnosis not present

## 2022-01-24 DIAGNOSIS — F32A Depression, unspecified: Secondary | ICD-10-CM | POA: Diagnosis not present

## 2022-01-24 DIAGNOSIS — H6993 Unspecified Eustachian tube disorder, bilateral: Secondary | ICD-10-CM

## 2022-01-24 MED ORDER — FLUOXETINE HCL 10 MG PO CAPS
10.0000 mg | ORAL_CAPSULE | Freq: Every day | ORAL | 1 refills | Status: DC
  Filled 2022-01-24: qty 90, 90d supply, fill #0

## 2022-01-24 MED ORDER — PREDNISONE 20 MG PO TABS
40.0000 mg | ORAL_TABLET | Freq: Every day | ORAL | 0 refills | Status: DC
Start: 2022-01-24 — End: 2022-02-13
  Filled 2022-01-24: qty 10, 5d supply, fill #0

## 2022-01-24 NOTE — Progress Notes (Signed)
Shirley Fleming is a 46 y.o. female here for a new problem.  History of Present Illness:   Chief Complaint  Patient presents with   Ear Pain    Recent travel by airplane Right ear pain prior to flights but worsened since. Pain in and outside of right ear. Pain along her right jawline when chewing. Some redness on the outside and inside of ear. Does have an appt in 2 weeks with ENT    HPI  Ear pain: She complains of ear pain, which started yesterday morning due to traveling. She took ibuprofen, which slightly helped alleviate her pain. The ear pain is causing her problem with chewing. She complains of having sinus. Denies fevers, chills.  Anxiety and depression: She is requesting an refill for 10 mg Prozac. She is currently doing well with this. Denies SI/HI.   Past Medical History:  Diagnosis Date   Allergy    Anxiety    Asthma    Depression 1995   Off and on since teens   History of COVID-19 12/23/2020   Hypertension Jan 2021   Hypoglycemia    Migraine with aura      Social History   Tobacco Use   Smoking status: Former    Years: 15.00    Types: Cigarettes    Quit date: 04/24/2005    Years since quitting: 16.7    Passive exposure: Never   Smokeless tobacco: Never  Vaping Use   Vaping Use: Never used  Substance Use Topics   Alcohol use: Yes    Alcohol/week: 2.0 standard drinks of alcohol    Types: 2 Glasses of wine per week   Drug use: Never    Past Surgical History:  Procedure Laterality Date   APPENDECTOMY     TONSILLECTOMY AND ADENOIDECTOMY      Family History  Problem Relation Age of Onset   COPD Mother    Depression Mother    Heart attack Father 33   Hypertension Father    Heart disease Father    Breast cancer Maternal Grandmother        dx 77s, d. 7s   Cancer Maternal Grandmother    Heart attack Maternal Grandfather    Heart disease Maternal Grandfather    Breast cancer Paternal Grandmother        dx 86s; + vaginal cancer 64s, jaw cancer 74s    Cancer Paternal Grandmother    Heart attack Paternal Grandfather    ADD / ADHD Son    Other Son        hypermobile EDS   Breast cancer Maternal Aunt        dx mid 58s   Diabetes Maternal Aunt    Hypertension Maternal Aunt    Cancer Maternal Aunt    Depression Maternal Aunt    Hypertension Maternal Aunt     Allergies  Allergen Reactions   Doxycycline Nausea Only   Erythromycin Nausea And Vomiting    Current Medications:   Current Outpatient Medications:    albuterol (VENTOLIN HFA) 108 (90 Base) MCG/ACT inhaler, Inhale 1-2 puffs into the lungs every 6 (six) hours as needed., Disp: 6.7 g, Rfl: 2   ALPRAZolam (XANAX) 0.25 MG tablet, Take 1 tablet (0.25 mg total) by mouth every 8 (eight) hours as needed., Disp: 30 tablet, Rfl: 1   B Complex-Biotin-FA (B-COMPLEX PO), Take 0.5 tablets by mouth daily in the afternoon., Disp: , Rfl:    cholecalciferol (VITAMIN D3) 25 MCG (1000 UT) tablet, Take 1,000 Units  by mouth daily., Disp: , Rfl:    ferrous sulfate 324 MG TBEC, Take 65 mg by mouth., Disp: , Rfl:    fluticasone (FLONASE) 50 MCG/ACT nasal spray, Place 1 spray into both nostrils daily., Disp: , Rfl:    magnesium 30 MG tablet, Take 30 mg by mouth daily., Disp: , Rfl:    Multiple Vitamin (MULTIVITAMIN) tablet, Take 1 tablet by mouth daily., Disp: , Rfl:    norethindrone (MICRONOR) 0.35 MG tablet, Take 1 tablet (0.35 mg total) by mouth daily., Disp: 84 tablet, Rfl: 3   predniSONE (DELTASONE) 20 MG tablet, Take 2 tablets (40 mg total) by mouth daily., Disp: 10 tablet, Rfl: 0   propranolol (INDERAL) 10 MG tablet, TAKE 1 TABLET BY MOUTH EVERY 12 HOURS AS NEEDED, Disp: 60 tablet, Rfl: 0   FLUoxetine (PROZAC) 10 MG capsule, Take 1 capsule (10 mg total) by mouth daily., Disp: 90 capsule, Rfl: 1   Review of Systems:   Review of Systems  HENT:  Positive for ear pain.   Negative unless otherwise specified per HPI.   Vitals:   Vitals:   01/24/22 1338  BP: 118/78  Pulse: 69  Resp: 16   Temp: 97.7 F (36.5 C)  TempSrc: Temporal  SpO2: 97%     There is no height or weight on file to calculate BMI.  Physical Exam:   Physical Exam Vitals and nursing note reviewed.  Constitutional:      General: She is not in acute distress.    Appearance: Normal appearance. She is well-developed. She is not ill-appearing or toxic-appearing.  HENT:     Head: Normocephalic and atraumatic.     Right Ear: Ear canal and external ear normal. A middle ear effusion is present. No mastoid tenderness. Tympanic membrane is not erythematous, retracted or bulging.     Left Ear: Ear canal and external ear normal. A middle ear effusion is present. No mastoid tenderness. Tympanic membrane is not erythematous, retracted or bulging.     Nose: Nose normal.     Right Sinus: No maxillary sinus tenderness or frontal sinus tenderness.     Left Sinus: No maxillary sinus tenderness or frontal sinus tenderness.     Mouth/Throat:     Pharynx: Uvula midline. No posterior oropharyngeal erythema.  Eyes:     General: Lids are normal.     Extraocular Movements: Extraocular movements intact.     Conjunctiva/sclera: Conjunctivae normal.     Pupils: Pupils are equal, round, and reactive to light.  Neck:     Trachea: Trachea normal.  Cardiovascular:     Rate and Rhythm: Normal rate and regular rhythm.     Heart sounds: Normal heart sounds, S1 normal and S2 normal. No murmur heard.    No gallop.  Pulmonary:     Effort: Pulmonary effort is normal. No respiratory distress.     Breath sounds: Normal breath sounds. No decreased breath sounds, wheezing, rhonchi or rales.  Lymphadenopathy:     Cervical: No cervical adenopathy.  Skin:    General: Skin is warm and dry.  Neurological:     Mental Status: She is alert and oriented to person, place, and time.  Psychiatric:        Speech: Speech normal.        Behavior: Behavior normal. Behavior is cooperative.        Judgment: Judgment normal.     Assessment and  Plan:   Dysfunction of both eustachian tubes No red flags She thankfully  has ENT appt in two weeks Start prednisone 20 mg BID x 5 days Follow-up if new/worsening sx  Anxiety; Depression, unspecified depression type Well controlled Continue prozac 10 mg daily Follow-up in 6 months, sooner if concerns  I,Param Shah,acting as a scribe for [employer], PA.,have documented all relevant documentation on the behalf of [name], PA,as directed by  [name], PA while in the presence of [name], [place].  I, [name], [place], have reviewed all documentation for this visit. The documentation on 01/24/22 for the exam, diagnosis, procedures, and orders are all accurate and complete.    [name], PA-C

## 2022-01-26 ENCOUNTER — Other Ambulatory Visit (HOSPITAL_BASED_OUTPATIENT_CLINIC_OR_DEPARTMENT_OTHER): Payer: Self-pay

## 2022-01-26 ENCOUNTER — Encounter: Payer: Self-pay | Admitting: Physician Assistant

## 2022-01-27 ENCOUNTER — Encounter: Payer: Self-pay | Admitting: Physician Assistant

## 2022-01-27 ENCOUNTER — Ambulatory Visit: Payer: BC Managed Care – PPO | Admitting: Physician Assistant

## 2022-01-27 ENCOUNTER — Other Ambulatory Visit: Payer: Self-pay | Admitting: Physician Assistant

## 2022-01-27 ENCOUNTER — Other Ambulatory Visit (HOSPITAL_BASED_OUTPATIENT_CLINIC_OR_DEPARTMENT_OTHER): Payer: Self-pay

## 2022-01-27 ENCOUNTER — Ambulatory Visit (HOSPITAL_COMMUNITY)
Admission: RE | Admit: 2022-01-27 | Discharge: 2022-01-27 | Disposition: A | Discharge status: Home / Self Care | Payer: BC Managed Care – PPO | Source: Ambulatory Visit | Attending: Physician Assistant | Admitting: Physician Assistant

## 2022-01-27 VITALS — BP 122/80 | HR 64 | Temp 97.3°F | Ht 65.25 in | Wt 194.5 lb

## 2022-01-27 DIAGNOSIS — R6884 Jaw pain: Secondary | ICD-10-CM | POA: Insufficient documentation

## 2022-01-27 DIAGNOSIS — H9201 Otalgia, right ear: Secondary | ICD-10-CM

## 2022-01-27 DIAGNOSIS — G932 Benign intracranial hypertension: Secondary | ICD-10-CM | POA: Diagnosis not present

## 2022-01-27 DIAGNOSIS — E230 Hypopituitarism: Secondary | ICD-10-CM

## 2022-01-27 MED ORDER — CIPROFLOXACIN-DEXAMETHASONE 0.3-0.1 % OT SUSP
4.0000 [drp] | Freq: Two times a day (BID) | OTIC | 0 refills | Status: DC
  Filled 2022-01-27: qty 7.5, 19d supply, fill #0

## 2022-01-27 MED ORDER — SODIUM CHLORIDE (PF) 0.9 % IJ SOLN
INTRAMUSCULAR | Status: AC
  Filled 2022-01-27: qty 50

## 2022-01-27 MED ORDER — IOHEXOL 300 MG/ML  SOLN
100.0000 mL | Freq: Once | INTRAMUSCULAR | Status: AC | PRN
  Administered 2022-01-27: 100 mL via INTRAVENOUS

## 2022-01-27 NOTE — Patient Instructions (Signed)
It was great to see you!  Stat CT today of your jaw/head -- someone will be in touch with you to schedule this Please stay by your phone  Start cipro-dex drops to your R ear to see if this can calm down your inflammation  I will be in touch with CT results  Take care,  [name] PA-C

## 2022-01-27 NOTE — Progress Notes (Signed)
Shirley Fleming is a 46 y.o. female here for a follow up of a pre-existing problem.  History of Present Illness:   Chief Complaint  Patient presents with   Otalgia    Pt c/o right ear pain is worsening, facial pain, neck pain behind the ear.    Otalgia     Ear pain She saw me on 01/24/22 for bilateral ETD. We treated with prednisone and she has been taking this as prescribed. She has had significant worsening of R ear pain. Pain is radiating to jaw and behind her ear. She cannot sleep because the pain is so bad. She is also taking ibuprofen and naproxyn. Denies: fevers, chills, malaise, inability to chew, inability to control secretions.   Past Medical History:  Diagnosis Date   Allergy    Anxiety    Asthma    Depression 1995   Off and on since teens   History of COVID-19 12/23/2020   Hypertension Jan 2021   Hypoglycemia    Migraine with aura      Social History   Tobacco Use   Smoking status: Former    Years: 15.00    Types: Cigarettes    Quit date: 04/24/2005    Years since quitting: 16.7    Passive exposure: Never   Smokeless tobacco: Never  Vaping Use   Vaping Use: Never used  Substance Use Topics   Alcohol use: Yes    Alcohol/week: 2.0 standard drinks of alcohol    Types: 2 Glasses of wine per week   Drug use: Never    Past Surgical History:  Procedure Laterality Date   APPENDECTOMY     TONSILLECTOMY AND ADENOIDECTOMY      Family History  Problem Relation Age of Onset   COPD Mother    Depression Mother    Heart attack Father 30   Hypertension Father    Heart disease Father    Breast cancer Maternal Grandmother        dx 61s, d. 70s   Cancer Maternal Grandmother    Heart attack Maternal Grandfather    Heart disease Maternal Grandfather    Breast cancer Paternal Grandmother        dx 32s; + vaginal cancer 16s, jaw cancer 8s   Cancer Paternal Grandmother    Heart attack Paternal Grandfather    ADD / ADHD Son    Other Son        hypermobile EDS    Breast cancer Maternal Aunt        dx mid 33s   Diabetes Maternal Aunt    Hypertension Maternal Aunt    Cancer Maternal Aunt    Depression Maternal Aunt    Hypertension Maternal Aunt     Allergies  Allergen Reactions   Doxycycline Nausea Only   Erythromycin Nausea And Vomiting    Current Medications:   Current Outpatient Medications:    albuterol (VENTOLIN HFA) 108 (90 Base) MCG/ACT inhaler, Inhale 1-2 puffs into the lungs every 6 (six) hours as needed., Disp: 6.7 g, Rfl: 2   ALPRAZolam (XANAX) 0.25 MG tablet, Take 1 tablet (0.25 mg total) by mouth every 8 (eight) hours as needed., Disp: 30 tablet, Rfl: 1   B Complex-Biotin-FA (B-COMPLEX PO), Take 0.5 tablets by mouth daily in the afternoon., Disp: , Rfl:    cholecalciferol (VITAMIN D3) 25 MCG (1000 UT) tablet, Take 1,000 Units by mouth daily., Disp: , Rfl:    ciprofloxacin-dexamethasone (CIPRODEX) OTIC suspension, Place 4 drops into the right ear  2 (two) times daily., Disp: 7.5 mL, Rfl: 0   ferrous sulfate 324 MG TBEC, Take 65 mg by mouth., Disp: , Rfl:    FLUoxetine (PROZAC) 10 MG capsule, Take 1 capsule (10 mg total) by mouth daily., Disp: 90 capsule, Rfl: 1   fluticasone (FLONASE) 50 MCG/ACT nasal spray, Place 1 spray into both nostrils daily., Disp: , Rfl:    magnesium 30 MG tablet, Take 30 mg by mouth daily., Disp: , Rfl:    Multiple Vitamin (MULTIVITAMIN) tablet, Take 1 tablet by mouth daily., Disp: , Rfl:    norethindrone (MICRONOR) 0.35 MG tablet, Take 1 tablet (0.35 mg total) by mouth daily., Disp: 84 tablet, Rfl: 3   predniSONE (DELTASONE) 20 MG tablet, Take 2 tablets (40 mg total) by mouth daily., Disp: 10 tablet, Rfl: 0   propranolol (INDERAL) 10 MG tablet, TAKE 1 TABLET BY MOUTH EVERY 12 HOURS AS NEEDED, Disp: 60 tablet, Rfl: 0   Review of Systems:   Review of Systems  HENT:  Positive for ear pain.    Negative unless otherwise specified per HPI.  Vitals:   Vitals:   01/27/22 0805  BP: 122/80  Pulse: 64   Temp: (!) 97.3 F (36.3 C)  TempSrc: Temporal  SpO2: 96%  Weight: 194 lb 8 oz (88.2 kg)  Height: 5' 5.25" (1.657 m)     Body mass index is 32.12 kg/m.  Physical Exam:   Physical Exam Vitals and nursing note reviewed.  Constitutional:      General: She is not in acute distress.    Appearance: She is well-developed. She is not ill-appearing or toxic-appearing.  HENT:     Head: Normocephalic and atraumatic.     Comments: Significant tenderness to R preauricular area     Right Ear: Tympanic membrane, ear canal and external ear normal. No mastoid tenderness. Tympanic membrane is not erythematous, retracted or bulging.     Left Ear: Tympanic membrane, ear canal and external ear normal. Tympanic membrane is not erythematous, retracted or bulging.     Ears:     Comments: Tenderness to upper aspect of external ear canal with use of otoscope Slight swelling present to R auricle compared to L auricle    Nose: Nose normal.     Right Sinus: No maxillary sinus tenderness or frontal sinus tenderness.     Left Sinus: No maxillary sinus tenderness or frontal sinus tenderness.     Mouth/Throat:     Pharynx: Uvula midline. No posterior oropharyngeal erythema.     Comments: Uneven palate with R soft palate significant lower than L soft palate Eyes:     General: Lids are normal.     Conjunctiva/sclera: Conjunctivae normal.  Neck:     Trachea: Trachea normal.  Cardiovascular:     Rate and Rhythm: Normal rate and regular rhythm.     Pulses: Normal pulses.     Heart sounds: Normal heart sounds, S1 normal and S2 normal.  Pulmonary:     Effort: Pulmonary effort is normal.     Breath sounds: Normal breath sounds. No decreased breath sounds, wheezing, rhonchi or rales.  Lymphadenopathy:     Cervical: No cervical adenopathy.  Skin:    General: Skin is warm and dry.  Neurological:     Mental Status: She is alert.     GCS: GCS eye subscore is 4. GCS verbal subscore is 5. GCS motor subscore is 6.   Psychiatric:        Speech: Speech normal.  Behavior: Behavior normal. Behavior is cooperative.     Assessment and Plan:   Right ear pain; Jaw pain Symptoms are worsening Given significant pain, evidence of swelling and exquisite tenderness, will obtain stat CT maxillofacial to assess for possible infection or mass Follow-up based on results Start cipro-dex drops in R ear to help with possible external otitis Already has follow-up with ENT scheduled on 10/16. If any worsening, needs to go to the ER  [name], PA-C

## 2022-01-30 ENCOUNTER — Encounter: Payer: Self-pay | Admitting: Neurology

## 2022-02-06 DIAGNOSIS — H60331 Swimmer's ear, right ear: Secondary | ICD-10-CM | POA: Diagnosis not present

## 2022-02-06 DIAGNOSIS — H6983 Other specified disorders of Eustachian tube, bilateral: Secondary | ICD-10-CM | POA: Diagnosis not present

## 2022-02-06 DIAGNOSIS — J310 Chronic rhinitis: Secondary | ICD-10-CM | POA: Diagnosis not present

## 2022-02-06 DIAGNOSIS — R42 Dizziness and giddiness: Secondary | ICD-10-CM | POA: Diagnosis not present

## 2022-02-08 DIAGNOSIS — F419 Anxiety disorder, unspecified: Secondary | ICD-10-CM | POA: Diagnosis not present

## 2022-02-08 DIAGNOSIS — F331 Major depressive disorder, recurrent, moderate: Secondary | ICD-10-CM | POA: Diagnosis not present

## 2022-02-13 ENCOUNTER — Ambulatory Visit: Payer: Self-pay

## 2022-02-13 ENCOUNTER — Ambulatory Visit (INDEPENDENT_AMBULATORY_CARE_PROVIDER_SITE_OTHER): Payer: BC Managed Care – PPO

## 2022-02-13 ENCOUNTER — Ambulatory Visit (INDEPENDENT_AMBULATORY_CARE_PROVIDER_SITE_OTHER): Payer: BC Managed Care – PPO | Admitting: Family Medicine

## 2022-02-13 VITALS — BP 128/86 | HR 69 | Ht 65.0 in | Wt 194.0 lb

## 2022-02-13 DIAGNOSIS — M25571 Pain in right ankle and joints of right foot: Secondary | ICD-10-CM

## 2022-02-13 DIAGNOSIS — M7989 Other specified soft tissue disorders: Secondary | ICD-10-CM | POA: Diagnosis not present

## 2022-02-13 DIAGNOSIS — M79671 Pain in right foot: Secondary | ICD-10-CM | POA: Diagnosis not present

## 2022-02-13 NOTE — Progress Notes (Signed)
   I, Peterson Lombard, LAT, ATC acting as a scribe for [name], MD.  Shirley Fleming is a 46 y.o. female who presents to [place] at [facility] today for R ankle/foot pain. Pt was previously seen by Dr. Georgina Snell on 09/27/21 for R hip pain. Today, pt reports fall off some stairs on Saturday, 10/21. Pt caught her heel on the step, falling, forcing her R ankle into PF and INV. Pt locates pain to the dorsum of the R foot and along the lateral aspect of the R ankle and dorsal lateral midfoot.  R foot/ankle Swelling: yes Aggravates: stairs, PF/INV, walking Treatments tried: ice, IBU, elevation   Pertinent review of systems: No fevers or chills  Relevant historical information: Hypertension.   Exam:  BP 128/86   Pulse 69   Ht '5\' 5"'$  (1.651 m)   Wt 194 lb (88 kg)   SpO2 98%   BMI 32.28 kg/m  General: Well Developed, well nourished, and in no acute distress.   MSK: Right foot and ankle mild swelling ankle.  Normal motion.  Tender palpation ATFL region and dorsal lateral midfoot and along the fifth metatarsal shaft. Stable ligamentous exam. Intact strength. Pulses cap refill and sensation are intact distally.     Lab and Radiology Results  X-ray images right foot and ankle obtained today personally and independently interpreted  Right ankle: No acute fractures are present.  Minimal degenerative changes.  Right foot: No fractures are visible.  Await formal radiology review     Assessment and Plan: 46 y.o. female with right foot and ankle pain after an inversion injury and fall downstairs.  No definitive fractures are visible on the x-rays per my read however radiology overread is still pending.  Recommend immobilization with Cam walker boot recheck in 2 weeks.   PDMP not reviewed this encounter. Orders Placed This Encounter  Procedures   DG Ankle Complete Right    Standing Status:   Future    Number of Occurrences:   1    Standing Expiration Date:    02/14/2023    Order Specific Question:   Reason for Exam (SYMPTOM  OR DIAGNOSIS REQUIRED)    Answer:   right ankle pain    Order Specific Question:   Preferred imaging location?    Answer:   [facility]    Order Specific Question:   Is patient pregnant?    Answer:   No   DG Foot Complete Right    Standing Status:   Future    Number of Occurrences:   1    Standing Expiration Date:   02/14/2023    Order Specific Question:   Reason for Exam (SYMPTOM  OR DIAGNOSIS REQUIRED)    Answer:   right foot pain    Order Specific Question:   Preferred imaging location?    Answer:   [facility]    Order Specific Question:   Is patient pregnant?    Answer:   No   No orders of the defined types were placed in this encounter.    Discussed warning signs or symptoms. Please see discharge instructions. Patient expresses understanding.   The above documentation has been reviewed and is accurate and complete [name], M.D.

## 2022-02-13 NOTE — Patient Instructions (Signed)
Thank you for coming in today.   Please go to [facility] supply to get the cam walker boot we talked about today. You may also be able to get it from [employer].    Recheck in 2 weeks.   Let me know if you have any problems.

## 2022-02-14 NOTE — Progress Notes (Signed)
Right foot x-ray shows some swelling with no fracture visible.  No severe arthritis is present.

## 2022-02-14 NOTE — Progress Notes (Signed)
Right ankle x-ray shows some ankle swelling.  No fracture is visible.  No severe arthritis is visible.

## 2022-02-15 DIAGNOSIS — F331 Major depressive disorder, recurrent, moderate: Secondary | ICD-10-CM | POA: Diagnosis not present

## 2022-02-15 DIAGNOSIS — F419 Anxiety disorder, unspecified: Secondary | ICD-10-CM | POA: Diagnosis not present

## 2022-02-20 ENCOUNTER — Encounter: Payer: Self-pay | Admitting: Physician Assistant

## 2022-02-20 ENCOUNTER — Other Ambulatory Visit (HOSPITAL_BASED_OUTPATIENT_CLINIC_OR_DEPARTMENT_OTHER): Payer: Self-pay

## 2022-02-20 MED ORDER — FLUOXETINE HCL 20 MG PO CAPS
20.0000 mg | ORAL_CAPSULE | Freq: Every day | ORAL | 1 refills | Status: DC
  Filled 2022-02-20: qty 30, 30d supply, fill #0
  Filled 2022-03-19: qty 30, 30d supply, fill #1
  Filled 2022-04-16 – 2022-04-17 (×2): qty 30, 30d supply, fill #2
  Filled 2022-05-19: qty 30, 30d supply, fill #3
  Filled 2022-06-14: qty 30, 30d supply, fill #4
  Filled 2022-07-12: qty 30, 30d supply, fill #5

## 2022-02-21 ENCOUNTER — Other Ambulatory Visit (HOSPITAL_BASED_OUTPATIENT_CLINIC_OR_DEPARTMENT_OTHER): Payer: Self-pay

## 2022-02-21 MED ORDER — PREDNISONE 10 MG PO TABS
ORAL_TABLET | ORAL | 0 refills | Status: DC
  Filled 2022-02-21: qty 21, 6d supply, fill #0

## 2022-02-21 MED ORDER — CIPROFLOXACIN HCL 500 MG PO TABS
500.0000 mg | ORAL_TABLET | Freq: Two times a day (BID) | ORAL | 0 refills | Status: DC
  Filled 2022-02-21: qty 14, 7d supply, fill #0

## 2022-02-22 DIAGNOSIS — F331 Major depressive disorder, recurrent, moderate: Secondary | ICD-10-CM | POA: Diagnosis not present

## 2022-02-22 DIAGNOSIS — F419 Anxiety disorder, unspecified: Secondary | ICD-10-CM | POA: Diagnosis not present

## 2022-02-28 ENCOUNTER — Ambulatory Visit (INDEPENDENT_AMBULATORY_CARE_PROVIDER_SITE_OTHER): Payer: BC Managed Care – PPO | Admitting: Family Medicine

## 2022-02-28 VITALS — BP 122/84 | HR 84 | Ht 65.0 in | Wt 194.0 lb

## 2022-02-28 DIAGNOSIS — S93401D Sprain of unspecified ligament of right ankle, subsequent encounter: Secondary | ICD-10-CM | POA: Diagnosis not present

## 2022-02-28 NOTE — Patient Instructions (Signed)
Thank you for coming in today.   Please complete the exercises that the athletic trainer went over with you:  View at [email or web address] using code: [id number]  Let me know if you want physical therapy  Check back as needed.

## 2022-02-28 NOTE — Progress Notes (Signed)
   I, Peterson Lombard, LAT, ATC acting as a scribe for [name], MD.  Shirley Fleming is a 46 y.o. female who presents to [place] at [facility] today for 2-wk f/u R ankle/foot pain. Pt fell off some stairs on Saturday, 10/21, catching her heel on the step, falling, forcing her R ankle into PF and INV. Pt was last seen by Dr. Georgina Snell on 02/13/22 and was advised to immobilize in a CAM walker boot. Today, pt reports R ankle is feeling someone better. Pt notes she pain w/ stairs and PF. Pt stopped wearing the boot due to causing R hip pain.   Dx imaging: 02/13/22 R foot & ankle XR  Pertinent review of systems: No fevers or chills  Relevant historical information: Hypertension   Exam:  BP 122/84   Pulse 84   Ht '5\' 5"'$  (1.651 m)   Wt 194 lb (88 kg)   LMP 02/06/2022   SpO2 98%   BMI 32.28 kg/m  General: Well Developed, well nourished, and in no acute distress.   MSK: Right ankle normal-appearing Normal motion. Positive talar tilt test and anterior drawer test.   Intact strength. Nontender    Lab and Radiology Results    FINDINGS: Right ankle:   Normal alignment. No acute fracture. Normal mineralization. Mild soft tissue swelling along the lateral malleolus.   Right foot:   Normal alignment. No acute fracture. Normal mineralization. The soft tissues are unremarkable. Calcaneal plantar enthesophyte.   IMPRESSION: Right ankle, right foot:   Mild soft tissue swelling along the lateral malleolus. No underlying acute fracture of the ankle or foot.     Electronically Signed   By: Albin Felling M.D.   On: 02/14/2022 10:31     I, [name], personally (independently) visualized and performed the interpretation of the images attached in this note.   Assessment and Plan: 46 y.o. female with right lateral ankle pain thought to be ankle sprain.  She has a little bit of laxity at ATFL region.  Plan for home exercise program taught in clinic today by ATC.   If needed physical therapy would be quite helpful.  She will let me know and I can place an order.  Check back as needed.  I also seen her daughter for a different orthopedic issues I will be seeing her incidentally in about 2 weeks and we certainly could reestablish care at that point if needed.     Discussed warning signs or symptoms. Please see discharge instructions. Patient expresses understanding.   The above documentation has been reviewed and is accurate and complete [name], M.D.

## 2022-03-02 DIAGNOSIS — F331 Major depressive disorder, recurrent, moderate: Secondary | ICD-10-CM | POA: Diagnosis not present

## 2022-03-02 DIAGNOSIS — F419 Anxiety disorder, unspecified: Secondary | ICD-10-CM | POA: Diagnosis not present

## 2022-03-10 DIAGNOSIS — F331 Major depressive disorder, recurrent, moderate: Secondary | ICD-10-CM | POA: Diagnosis not present

## 2022-03-10 DIAGNOSIS — F419 Anxiety disorder, unspecified: Secondary | ICD-10-CM | POA: Diagnosis not present

## 2022-03-20 ENCOUNTER — Other Ambulatory Visit (HOSPITAL_BASED_OUTPATIENT_CLINIC_OR_DEPARTMENT_OTHER): Payer: Self-pay

## 2022-03-30 NOTE — Progress Notes (Signed)
Initial neurology clinic note  Shirley Fleming MRN: [id number] DOB: Sep 05, 1975  Referring provider: [name], PA  Primary care provider: [name], [place]  Reason for consult:  right neck, ear. and face pain  Subjective:  This is [name], a 46 y.o. right-handed female with a medical history of HTN, migraine, asthma, depression who presents to neurology clinic with right face pain, ear pain, and headaches. The patient is alone today.  Patient has pain in and around right ear and along jaw when chewing that started 01/23/22 while traveling. Her symptoms have mostly improved. She had a work up included a maxillofacial CT which showed an incidental partially empty sella. Given her pressure headaches and potential concern for IIH, patient was referred to neurology.  Patient has seen ENT. He thinks patient has inner ear problems (?pressure).  In terms of headaches, she has a history of migraines. She had sinus headaches as a child. She started getting migraines after having her son. She has pressure in her head, usually one sided, that will switch after a 18 hours and continue for 18 hours on the other side. She endorses photophobia, phonophobia, nausea. She also is sensitive to smells. She would normally get the headaches with her cycle. Of late, it has been more random, maybe none for 2 months, then maybe 2 per month. They typically last from 1-3 days. She can have head pain. She can take ibuprofen that sometimes helps. She has never tried a migraine medication. She will lay down in a dark room and lay on the side of the headache for help. Her husband may also rub her neck and give some relief. She can wake up with headaches. Getting up and moving can help. Flonase can also help.  She endorses vision changes for about 1 year ago. She had COVID around that time. She has noticed her vision is worse. She will feel like sometimes one eye won't see correctly. She will blink and wait and  things will get better. She was seen by her eye doctor who dilated her eyes and said that looked okay, but that she needed new prescription. She feels like things are not as bright as usual as well, but can be too bright when having a migraine.  Patient is on B-complex and multivitamin. She is on propranolol as needed due to occasional tachycardia after COVID. She takes Prozac for depression and xanax as needed.   EtOH: 1 glass of wine at night Caffeine: 2-3 glasses of coffee per day   MEDICATIONS:  Outpatient Encounter Medications as of 04/05/2022  Medication Sig   albuterol (VENTOLIN HFA) 108 (90 Base) MCG/ACT inhaler Inhale 1-2 puffs into the lungs every 6 (six) hours as needed.   ALPRAZolam (XANAX) 0.25 MG tablet Take 1 tablet (0.25 mg total) by mouth every 8 (eight) hours as needed.   B Complex-Biotin-FA (B-COMPLEX PO) Take 0.5 tablets by mouth daily in the afternoon.   cholecalciferol (VITAMIN D3) 25 MCG (1000 UT) tablet Take 1,000 Units by mouth daily.   ferrous sulfate 324 MG TBEC Take 65 mg by mouth.   FLUoxetine (PROZAC) 20 MG capsule Take 1 capsule (20 mg total) by mouth daily.   fluticasone (FLONASE) 50 MCG/ACT nasal spray Place 1 spray into both nostrils daily.   magnesium 30 MG tablet Take 30 mg by mouth daily.   Multiple Vitamin (MULTIVITAMIN) tablet Take 1 tablet by mouth daily.   norethindrone (MICRONOR) 0.35 MG tablet Take 1 tablet (0.35 mg total) by  mouth daily.   propranolol (INDERAL) 10 MG tablet TAKE 1 TABLET BY MOUTH EVERY 12 HOURS AS NEEDED   No facility-administered encounter medications on file as of 04/05/2022.    PAST MEDICAL HISTORY: Past Medical History:  Diagnosis Date   Allergy    Anxiety    Asthma    Depression 1995   Off and on since teens   History of COVID-19 12/23/2020   Hypertension Jan 2021   Hypoglycemia    Migraine with aura     PAST SURGICAL HISTORY: Past Surgical History:  Procedure Laterality Date   APPENDECTOMY     TONSILLECTOMY  AND ADENOIDECTOMY      ALLERGIES: Allergies  Allergen Reactions   Doxycycline Nausea Only   Erythromycin Nausea And Vomiting    FAMILY HISTORY: Family History  Problem Relation Age of Onset   COPD Mother    Depression Mother    Heart attack Father 41   Hypertension Father    Heart disease Father    Breast cancer Maternal Grandmother        dx 35s, d. 64s   Cancer Maternal Grandmother    Heart attack Maternal Grandfather    Heart disease Maternal Grandfather    Breast cancer Paternal Grandmother        dx 53s; + vaginal cancer 48s, jaw cancer 18s   Cancer Paternal Grandmother    Heart attack Paternal Grandfather    ADD / ADHD Son    Other Son        hypermobile EDS   Breast cancer Maternal Aunt        dx mid 47s   Diabetes Maternal Aunt    Hypertension Maternal Aunt    Cancer Maternal Aunt    Depression Maternal Aunt    Hypertension Maternal Aunt     SOCIAL HISTORY: Social History   Tobacco Use   Smoking status: Former    Years: 15.00    Types: Cigarettes    Quit date: 04/24/2005    Years since quitting: 16.9    Passive exposure: Never   Smokeless tobacco: Never  Vaping Use   Vaping Use: Never used  Substance Use Topics   Alcohol use: Yes    Alcohol/week: 2.0 standard drinks of alcohol    Types: 2 Glasses of wine per week   Drug use: Never   Social History   Social History Narrative   Married   Two children    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  Patient appears well-groomed.   Head:  Normocephalic/atraumatic Eyes:  fundi examined, lateral margins of disc clear and crisp; medial margins not well visualized Neck: supple, some paraspinal tenderness, full range of motion Back: No paraspinal tenderness Heart: regular rate and rhythm Lungs: Clear to  auscultation bilaterally. Vascular: No carotid bruits.  Neurological Exam: Mental status: alert and oriented to person, place, and time, speech fluent and not dysarthric, language intact.  Cranial nerves: CN I: not tested CN II: pupils equal, round and reactive to light, visual fields intact CN III, IV, VI:  full range of motion, no nystagmus, no ptosis CN V: facial sensation intact. CN VII: upper and lower face symmetric CN VIII: hearing intact  CN IX, X: gag intact, uvula midline CN XI: sternocleidomastoid and trapezius muscles intact CN XII: tongue midline  Bulk & Tone: normal, no fasciculations. Motor:  muscle strength 5/5 throughout Deep Tendon Reflexes:  2+ throughout  Sensation:  Pinprick sensation intact. Finger to nose testing:  Without dysmetria.    Gait:  Normal station and stride.  Romberg negative.   Labs and Imaging review: Internal labs: No results found for: "HGBA1C" No results found for: "VITAMINB12" Lab Results  Component Value Date   TSH 2.03 08/05/2021   No results found for: "ESRSEDRATE", "POCTSEDRATE"  CBC and CMP wnl on 11/29/21 Vit D wnl on 05/24/20  Imaging: CT maxillofacial w contrast (01/27/22): FINDINGS: Osseous: No acute fracture or aggressive osseous lesion identified.   Orbits: No orbital mass or acute orbital finding.   Sinuses: No significant paranasal sinus disease.   Soft tissues: Asymmetric soft tissue thickening and enhancement at the lateral aspect of the right external auditory canal.   Limited intracranial: No evidence of acute intracranial abnormality within the field of view. Partially empty sella turcica.   Other: The middle ear cavities appear normally aerated. No significant mastoid effusion. Streak and beam hardening artifact arising from dental restoration partially obscures the oral cavity.   IMPRESSION: Asymmetric soft tissue thickening and enhancement at the lateral aspect of the right external auditory canal,  nonspecific but suspicious for acute otitis externa given the provided history. Direct visualization is recommended.   Partially empty sella turcica. While this finding often reflects incidental anatomic variation, it can also be associated with idiopathic intracranial hypertension (pseudotumor cerebri).  Assessment/Plan:  Shirley Fleming is a 47 y.o. female who presents for evaluation of migraines and positional headaches. She has a relevant medical history of HTN, migraine, asthma, depression. Her neurological examination is essentially normal today. Available diagnostic data is significant for CT maxillofacial with a partially empty sella. Patient does have a history of migraines, however, she has a newer headache that appears to be positional (worse with laying flat). With the vision changes and partially empty sella on CT, this is concerning for increased intracranial pressure. I will get an MRI brain to ensure no intracranial pathology, then pursue LP with opening pressure to evaluate for IIH if MRI brain is unremarkable.  PLAN: -Blood work: B12, B1, B6 -MRI brain wo contrast -After MRI will likely order lumbar puncture with routine analysis and high volume tap -Patient would benefit from migraine preventative, such as a triptan, but given infrequent headaches, we will wait to see what work up shows.  -Return to clinic 2-3 months  The impression above as well as the plan as outlined below were extensively discussed with the patient who voiced understanding. All questions were answered to their satisfaction.  When available, results of the above investigations and possible further recommendations will be communicated to the patient via telephone/MyChart. Patient to call office if not contacted after expected testing turnaround time.   Total time spent reviewing records, interview, history/exam, documentation, and coordination of care on day of encounter:  50 min   Thank you for allowing  me to participate in patient's care.  If I can answer any additional questions, I would be pleased to do so.  Kai Levins, MD   CC: [name], [place] [id number]  CC: Referring provider: [name], [address] [place],  [place] [address]

## 2022-04-05 ENCOUNTER — Ambulatory Visit (INDEPENDENT_AMBULATORY_CARE_PROVIDER_SITE_OTHER): Payer: BC Managed Care – PPO | Admitting: Neurology

## 2022-04-05 ENCOUNTER — Other Ambulatory Visit (INDEPENDENT_AMBULATORY_CARE_PROVIDER_SITE_OTHER): Payer: BC Managed Care – PPO

## 2022-04-05 ENCOUNTER — Encounter: Payer: Self-pay | Admitting: Neurology

## 2022-04-05 VITALS — BP 138/71 | HR 70 | Ht 66.0 in | Wt 192.8 lb

## 2022-04-05 DIAGNOSIS — G43009 Migraine without aura, not intractable, without status migrainosus: Secondary | ICD-10-CM

## 2022-04-05 DIAGNOSIS — M542 Cervicalgia: Secondary | ICD-10-CM

## 2022-04-05 DIAGNOSIS — R51 Headache with orthostatic component, not elsewhere classified: Secondary | ICD-10-CM

## 2022-04-05 DIAGNOSIS — H539 Unspecified visual disturbance: Secondary | ICD-10-CM | POA: Diagnosis not present

## 2022-04-05 DIAGNOSIS — F331 Major depressive disorder, recurrent, moderate: Secondary | ICD-10-CM | POA: Diagnosis not present

## 2022-04-05 DIAGNOSIS — F419 Anxiety disorder, unspecified: Secondary | ICD-10-CM | POA: Diagnosis not present

## 2022-04-05 LAB — VITAMIN B12: Vitamin B-12: 1005 pg/mL — ABNORMAL HIGH (ref 211–911)

## 2022-04-05 NOTE — Patient Instructions (Signed)
I saw you today for your headaches. While you have migraines, you may also have increased pressure headaches.  I would like to do some blood work today.  I would like to get an MRI of your brain.  If that looks okay, we will do a lumbar puncture to check the pressure of your spinal fluid to look for increased pressure.  I will be in touch when I have your results.  If you have vision loss or sudden severe headache different from previous, please go to the nearest emergency department.  I would like to see you back in clinic after this work up, in 2-3 months. Please let me know if you have any questions or concerns in the meantime.   The physicians and staff at [facility] are committed to providing excellent care. You may receive a survey requesting feedback about your experience at our office. We strive to receive "very good" responses to the survey questions. If you feel that your experience would prevent you from giving the office a "very good " response, please contact our office to try to remedy the situation. We may be reached at [phone]. Thank you for taking the time out of your busy day to complete the survey.  Kai Levins, MD [facility]

## 2022-04-10 DIAGNOSIS — F419 Anxiety disorder, unspecified: Secondary | ICD-10-CM | POA: Diagnosis not present

## 2022-04-10 DIAGNOSIS — F331 Major depressive disorder, recurrent, moderate: Secondary | ICD-10-CM | POA: Diagnosis not present

## 2022-04-12 LAB — VITAMIN B1: Vitamin B1 (Thiamine): 38 nmol/L — ABNORMAL HIGH (ref 8–30)

## 2022-04-12 LAB — VITAMIN B6: Vitamin B6: 36.7 ng/mL — ABNORMAL HIGH (ref 2.1–21.7)

## 2022-04-17 ENCOUNTER — Other Ambulatory Visit (HOSPITAL_BASED_OUTPATIENT_CLINIC_OR_DEPARTMENT_OTHER): Payer: Self-pay

## 2022-04-17 ENCOUNTER — Other Ambulatory Visit: Payer: Self-pay

## 2022-04-18 ENCOUNTER — Other Ambulatory Visit (HOSPITAL_BASED_OUTPATIENT_CLINIC_OR_DEPARTMENT_OTHER): Payer: Self-pay

## 2022-04-24 DIAGNOSIS — L820 Inflamed seborrheic keratosis: Secondary | ICD-10-CM

## 2022-04-24 HISTORY — DX: Inflamed seborrheic keratosis: L82.0

## 2022-05-02 DIAGNOSIS — F331 Major depressive disorder, recurrent, moderate: Secondary | ICD-10-CM | POA: Diagnosis not present

## 2022-05-02 DIAGNOSIS — F419 Anxiety disorder, unspecified: Secondary | ICD-10-CM | POA: Diagnosis not present

## 2022-05-03 ENCOUNTER — Ambulatory Visit
Admission: RE | Admit: 2022-05-03 | Discharge: 2022-05-03 | Disposition: A | Discharge status: Home / Self Care | Payer: BC Managed Care – PPO | Source: Ambulatory Visit | Attending: Hematology and Oncology | Admitting: Hematology and Oncology

## 2022-05-03 DIAGNOSIS — Z9189 Other specified personal risk factors, not elsewhere classified: Secondary | ICD-10-CM

## 2022-05-03 DIAGNOSIS — Z1239 Encounter for other screening for malignant neoplasm of breast: Secondary | ICD-10-CM | POA: Diagnosis not present

## 2022-05-03 MED ORDER — GADOPICLENOL 0.5 MMOL/ML IV SOLN
9.0000 mL | Freq: Once | INTRAVENOUS | Status: AC | PRN
  Administered 2022-05-03: 9 mL via INTRAVENOUS

## 2022-05-07 ENCOUNTER — Other Ambulatory Visit: Payer: BC Managed Care – PPO

## 2022-05-07 ENCOUNTER — Ambulatory Visit
Admission: RE | Admit: 2022-05-07 | Discharge: 2022-05-07 | Disposition: A | Discharge status: Home / Self Care | Payer: BC Managed Care – PPO | Source: Ambulatory Visit | Attending: Neurology | Admitting: Neurology

## 2022-05-07 DIAGNOSIS — H539 Unspecified visual disturbance: Secondary | ICD-10-CM

## 2022-05-07 DIAGNOSIS — M542 Cervicalgia: Secondary | ICD-10-CM

## 2022-05-07 DIAGNOSIS — R519 Headache, unspecified: Secondary | ICD-10-CM | POA: Diagnosis not present

## 2022-05-07 DIAGNOSIS — G43009 Migraine without aura, not intractable, without status migrainosus: Secondary | ICD-10-CM

## 2022-05-07 DIAGNOSIS — R51 Headache with orthostatic component, not elsewhere classified: Secondary | ICD-10-CM

## 2022-05-08 ENCOUNTER — Other Ambulatory Visit: Payer: Self-pay

## 2022-05-08 ENCOUNTER — Encounter: Payer: Self-pay | Admitting: Neurology

## 2022-05-08 DIAGNOSIS — R51 Headache with orthostatic component, not elsewhere classified: Secondary | ICD-10-CM

## 2022-05-08 DIAGNOSIS — G932 Benign intracranial hypertension: Secondary | ICD-10-CM

## 2022-05-08 DIAGNOSIS — H539 Unspecified visual disturbance: Secondary | ICD-10-CM

## 2022-05-09 ENCOUNTER — Other Ambulatory Visit: Payer: Self-pay

## 2022-05-11 DIAGNOSIS — F331 Major depressive disorder, recurrent, moderate: Secondary | ICD-10-CM | POA: Diagnosis not present

## 2022-05-11 DIAGNOSIS — F419 Anxiety disorder, unspecified: Secondary | ICD-10-CM | POA: Diagnosis not present

## 2022-05-18 DIAGNOSIS — F331 Major depressive disorder, recurrent, moderate: Secondary | ICD-10-CM | POA: Diagnosis not present

## 2022-05-18 DIAGNOSIS — F419 Anxiety disorder, unspecified: Secondary | ICD-10-CM | POA: Diagnosis not present

## 2022-05-19 ENCOUNTER — Other Ambulatory Visit: Payer: Self-pay

## 2022-05-24 ENCOUNTER — Ambulatory Visit
Admission: RE | Admit: 2022-05-24 | Discharge: 2022-05-24 | Disposition: A | Discharge status: Home / Self Care | Payer: BC Managed Care – PPO | Source: Ambulatory Visit | Attending: Neurology | Admitting: Neurology

## 2022-05-24 VITALS — BP 117/75 | HR 61

## 2022-05-24 DIAGNOSIS — G932 Benign intracranial hypertension: Secondary | ICD-10-CM | POA: Diagnosis not present

## 2022-05-24 DIAGNOSIS — R51 Headache with orthostatic component, not elsewhere classified: Secondary | ICD-10-CM

## 2022-05-24 DIAGNOSIS — H539 Unspecified visual disturbance: Secondary | ICD-10-CM

## 2022-05-24 LAB — PROTEIN, CSF: Total Protein, CSF: 37 mg/dL (ref 15–45)

## 2022-05-24 LAB — GLUCOSE, CSF: Glucose, CSF: 66 mg/dL (ref 40–80)

## 2022-05-24 LAB — CSF CELL COUNT WITH DIFFERENTIAL
RBC Count, CSF: 0 cells/uL
TOTAL NUCLEATED CELL: 1 cells/uL (ref 0–5)

## 2022-05-24 NOTE — Discharge Instructions (Signed)

## 2022-05-25 DIAGNOSIS — F331 Major depressive disorder, recurrent, moderate: Secondary | ICD-10-CM | POA: Diagnosis not present

## 2022-05-25 DIAGNOSIS — F419 Anxiety disorder, unspecified: Secondary | ICD-10-CM | POA: Diagnosis not present

## 2022-05-31 NOTE — Progress Notes (Signed)
NEUROLOGY FOLLOW UP OFFICE NOTE  Shirley Fleming BE:[id number]  Subjective:  Shirley Fleming is a 47 y.o. year old right-handed female with a medical history of HTN, migraine, asthma, depression who we last saw on 04/05/22.  To briefly review: Patient has pain in and around right ear and along jaw when chewing that started 01/23/22 while traveling. Her symptoms have mostly improved. She had a work up included a maxillofacial CT which showed an incidental partially empty sella. Given her pressure headaches and potential concern for IIH, patient was referred to neurology.   Patient has seen ENT. He thinks patient has inner ear problems (?pressure).   In terms of headaches, she has a history of migraines. She had sinus headaches as a child. She started getting migraines after having her son. She has pressure in her head, usually one sided, that will switch after a 18 hours and continue for 18 hours on the other side. She endorses photophobia, phonophobia, nausea. She also is sensitive to smells. She would normally get the headaches with her cycle. Of late, it has been more random, maybe none for 2 months, then maybe 2 per month. They typically last from 1-3 days. She can have head pain. She can take ibuprofen that sometimes helps. She has never tried a migraine medication. She will lay down in a dark room and lay on the side of the headache for help. Her husband may also rub her neck and give some relief. She can wake up with headaches. Getting up and moving can help. Flonase can also help.   She endorses vision changes for about 1 year ago. She had COVID around that time. She has noticed her vision is worse. She will feel like sometimes one eye won't see correctly. She will blink and wait and things will get better. She was seen by her eye doctor who dilated her eyes and said that looked okay, but that she needed new prescription. She feels like things are not as bright as usual as well, but can be too bright  when having a migraine.   Patient is on B-complex and multivitamin. She is on propranolol as needed due to occasional tachycardia after COVID. She takes Prozac for depression and xanax as needed.   EtOH: 1 glass of wine at night Caffeine: 2-3 glasses of coffee per day   Most recent Assessment and Plan (04/05/22): Patient does have a history of migraines, however, she has a newer headache that appears to be positional (worse with laying flat). With the vision changes and partially empty sella on CT, this is concerning for increased intracranial pressure. I will get an MRI brain to ensure no intracranial pathology, then pursue LP with opening pressure to evaluate for IIH if MRI brain is unremarkable.   PLAN: -Blood work: B12, B1, B6 -MRI brain wo contrast -After MRI will likely order lumbar puncture with routine analysis and high volume tap -Patient would benefit from migraine abortive medication, such as a triptan, but given infrequent headaches, we will wait to see what work up shows.  Since their last visit: Labs were significant for elevated B vitamins including B6. I recommended she stop any supplements, which she did.   MRI brain on 05/07/21 again showed a partially empty sella but was otherwise remarkable. She had a lumbar puncture on 05/24/22 with an opening pressure of 17 cm of water. Routine analysis was normal. She was headache free for a few days. The headaches returned but did not have a positional  component to them. She thinks the vision might have been a little better for a few days as well.  She had low back pain and nausea for a couple of weeks after LP.  Since 03/2022 visit, patient has had 3-4 headaches. They tend to cluster on one side of the face at a time, but can be either side. She has nausea, photophobia, and phonophobia. She is taking propranolol, but this has not helped with headaches. She will take ibuprofen for headaches, which sometimes helps.  She does not have a  history of kidney stones.  MEDICATIONS:  Outpatient Encounter Medications as of 06/07/2022  Medication Sig   albuterol (VENTOLIN HFA) 108 (90 Base) MCG/ACT inhaler Inhale 1-2 puffs into the lungs every 6 (six) hours as needed.   ALPRAZolam (XANAX) 0.25 MG tablet Take 1 tablet (0.25 mg total) by mouth every 8 (eight) hours as needed.   B Complex-Biotin-FA (B-COMPLEX PO) Take 0.5 tablets by mouth daily in the afternoon.   cholecalciferol (VITAMIN D3) 25 MCG (1000 UT) tablet Take 1,000 Units by mouth daily.   ferrous sulfate 324 MG TBEC Take 65 mg by mouth.   FLUoxetine (PROZAC) 20 MG capsule Take 1 capsule (20 mg total) by mouth daily.   fluticasone (FLONASE) 50 MCG/ACT nasal spray Place 1 spray into both nostrils daily.   magnesium 30 MG tablet Take 30 mg by mouth daily.   Multiple Vitamin (MULTIVITAMIN) tablet Take 1 tablet by mouth daily.   norethindrone (MICRONOR) 0.35 MG tablet Take 1 tablet (0.35 mg total) by mouth daily.   propranolol (INDERAL) 10 MG tablet TAKE 1 TABLET BY MOUTH EVERY 12 HOURS AS NEEDED   topiramate (TOPAMAX) 25 MG tablet Take 1 tablet (25 mg) at bedtime for 1 week, then increase to 2 tablets (50 mg) at bedtime thereafter.   No facility-administered encounter medications on file as of 06/07/2022.    PAST MEDICAL HISTORY: Past Medical History:  Diagnosis Date   Allergy    Anxiety    Asthma    Depression 1995   Off and on since teens   History of COVID-19 12/23/2020   Hypertension Jan 2021   Hypoglycemia    Migraine with aura     PAST SURGICAL HISTORY: Past Surgical History:  Procedure Laterality Date   APPENDECTOMY     TONSILLECTOMY AND ADENOIDECTOMY      ALLERGIES: Allergies  Allergen Reactions   Doxycycline Nausea Only   Erythromycin Nausea And Vomiting    FAMILY HISTORY: Family History  Problem Relation Age of Onset   COPD Mother    Depression Mother    Heart attack Father 40   Hypertension Father    Heart disease Father    Breast cancer  Maternal Grandmother        dx 81s, d. 80s   Cancer Maternal Grandmother    Heart attack Maternal Grandfather    Heart disease Maternal Grandfather    Breast cancer Paternal Grandmother        dx 8s; + vaginal cancer 31s, jaw cancer 40s   Cancer Paternal Grandmother    Heart attack Paternal Grandfather    ADD / ADHD Son    Other Son        hypermobile EDS   Breast cancer Maternal Aunt        dx mid 69s   Diabetes Maternal Aunt    Hypertension Maternal Aunt    Cancer Maternal Aunt    Depression Maternal Aunt    Hypertension Maternal Aunt  SOCIAL HISTORY: Social History   Tobacco Use   Smoking status: Former    Years: 15.00    Types: Cigarettes    Quit date: 04/24/2005    Years since quitting: 17.1    Passive exposure: Never   Smokeless tobacco: Never  Vaping Use   Vaping Use: Never used  Substance Use Topics   Alcohol use: Yes    Alcohol/week: 2.0 standard drinks of alcohol    Types: 2 Glasses of wine per week   Drug use: Never   Social History   Social History Narrative   Married   Two children    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 Right   Are you currently employed ? yes   What is your current occupation? home   Do you live at home alone? Husband and kids and Dad   Who lives with you?    What type of home do you live in: 1 story or 2 story? two    Caffeine 2-3 day      Objective:  Vital Signs:  BP 126/76   Pulse 78   Ht 5' 6"$  (1.676 m)   Wt 198 lb (89.8 kg)   SpO2 96%   BMI 31.96 kg/m   General: No acute distress.  Patient appears well-groomed.   Head:  Normocephalic/atraumatic Eyes:  fundi examined - disc margins appear crisp, no obvious papilledema Neck: supple, no paraspinal tenderness, full range of motion Back: No paraspinal tenderness Heart: regular rate and rhythm Lungs: Clear to auscultation bilaterally. Vascular: No carotid bruits.  Neurological  Exam: Mental status: alert and oriented, speech fluent and not dysarthric, language intact.  Cranial nerves: CN I: not tested CN II: pupils equal, round and reactive to light, visual fields intact CN III, IV, VI:  full range of motion, no nystagmus, no ptosis CN V: facial sensation intact. CN VII: upper and lower face symmetric CN VIII: hearing intact CN IX, X: gag intact, uvula midline CN XI: sternocleidomastoid and trapezius muscles intact CN XII: tongue midline  Bulk & Tone: normal Motor:  muscle strength 5/5 throughout Deep Tendon Reflexes:  2+ throughout  Sensation:  Light touch sensation intact. Finger to nose testing:  Without dysmetria.   Gait:  Normal station and stride   Labs and Imaging review: New results: 04/05/22: B6: 36.7 B1: 38 B12: 1005 CSF: 0 R, 1 W, 37 P, 66 G; opening pressure 17 cm of water  MRI brain wo contrast (05/07/22): FINDINGS: Brain:   No age advanced or lobar predominant parenchymal atrophy.   Partially empty sella turcica.   No cortical encephalomalacia is identified. No significant cerebral white matter disease.   There is no acute infarct.   No evidence of an intracranial mass.   No chronic intracranial blood products.   No extra-axial fluid collection.   No midline shift.   Vascular: Maintained flow voids within the proximal large arterial vessels.   Skull and upper cervical spine: No focal suspicious marrow lesion.   Sinuses/Orbits: No mass or acute finding within the imaged orbits. No significant paranasal sinus disease.   IMPRESSION: 1. No evidence of an intracranial mass or acute infarct. 2. Partially empty sella turcica. This finding can reflect incidental anatomic variation, or alternatively, it can be associated with idiopathic intracranial hypertension (pseudotumor cerebri). 3. Otherwise unremarkable non-contrast MRI appearance of the brain.  Previously reviewed results: Lab Results  Component Value  Date     TSH 2.03 08/05/2021       CBC and CMP wnl on 11/29/21 Vit D wnl on 05/24/20   Imaging: CT maxillofacial w contrast (01/27/22): FINDINGS: Osseous: No acute fracture or aggressive osseous lesion identified.   Orbits: No orbital mass or acute orbital finding.   Sinuses: No significant paranasal sinus disease.   Soft tissues: Asymmetric soft tissue thickening and enhancement at the lateral aspect of the right external auditory canal.   Limited intracranial: No evidence of acute intracranial abnormality within the field of view. Partially empty sella turcica.   Other: The middle ear cavities appear normally aerated. No significant mastoid effusion. Streak and beam hardening artifact arising from dental restoration partially obscures the oral cavity.   IMPRESSION: Asymmetric soft tissue thickening and enhancement at the lateral aspect of the right external auditory canal, nonspecific but suspicious for acute otitis externa given the provided history. Direct visualization is recommended.   Partially empty sella turcica. While this finding often reflects incidental anatomic variation, it can also be associated with idiopathic intracranial hypertension (pseudotumor cerebri).  Assessment/Plan:  This is Shirley Fleming, a 47 y.o. female with headaches. There was initially concern for positional component to headache (IIH), but LP without evidence of elevated pressure. She did have some relief of symptoms after LP though. Today her headaches sound more migrainous in nature. I will start topiramate as it can both prevent migraines and is a treatment of IIH.   Plan: -Start topiramate 25 mg daily for 1 week, then increase to 50 mg daily -Will continue NSAIDs for acute treatment, but will consider triptan if not working  Return to clinic in 3 months  Total time spent reviewing records, interview, history/exam, documentation, and coordination of care on day of encounter:  30 min  Kai Levins,  MD

## 2022-06-01 DIAGNOSIS — F419 Anxiety disorder, unspecified: Secondary | ICD-10-CM | POA: Diagnosis not present

## 2022-06-01 DIAGNOSIS — F331 Major depressive disorder, recurrent, moderate: Secondary | ICD-10-CM | POA: Diagnosis not present

## 2022-06-07 ENCOUNTER — Ambulatory Visit (INDEPENDENT_AMBULATORY_CARE_PROVIDER_SITE_OTHER): Payer: BC Managed Care – PPO | Admitting: Neurology

## 2022-06-07 ENCOUNTER — Other Ambulatory Visit (HOSPITAL_BASED_OUTPATIENT_CLINIC_OR_DEPARTMENT_OTHER): Payer: Self-pay

## 2022-06-07 ENCOUNTER — Encounter: Payer: Self-pay | Admitting: Neurology

## 2022-06-07 VITALS — BP 126/76 | HR 78 | Ht 66.0 in | Wt 198.0 lb

## 2022-06-07 DIAGNOSIS — R51 Headache with orthostatic component, not elsewhere classified: Secondary | ICD-10-CM

## 2022-06-07 DIAGNOSIS — H539 Unspecified visual disturbance: Secondary | ICD-10-CM

## 2022-06-07 DIAGNOSIS — G43009 Migraine without aura, not intractable, without status migrainosus: Secondary | ICD-10-CM | POA: Diagnosis not present

## 2022-06-07 DIAGNOSIS — M542 Cervicalgia: Secondary | ICD-10-CM | POA: Diagnosis not present

## 2022-06-07 MED ORDER — TOPIRAMATE 25 MG PO TABS
50.0000 mg | ORAL_TABLET | Freq: Every day | ORAL | 3 refills | Status: DC
  Filled 2022-06-07: qty 60, 30d supply, fill #0
  Filled 2022-07-05: qty 60, 30d supply, fill #1

## 2022-06-07 NOTE — Patient Instructions (Signed)
Start topiramate 25 mg daily for 1 week, then increase to 50 mg daily.  I want to see you back in clinic in about 3 months.  Please let me know if you have any questions or concerns in the meantime.   The physicians and staff at [facility] are committed to providing excellent care. You may receive a survey requesting feedback about your experience at our office. We strive to receive "very good" responses to the survey questions. If you feel that your experience would prevent you from giving the office a "very good " response, please contact our office to try to remedy the situation. We may be reached at [phone]. Thank you for taking the time out of your busy day to complete the survey.  Kai Levins, MD [facility]

## 2022-06-14 DIAGNOSIS — J310 Chronic rhinitis: Secondary | ICD-10-CM | POA: Diagnosis not present

## 2022-06-14 DIAGNOSIS — J342 Deviated nasal septum: Secondary | ICD-10-CM | POA: Diagnosis not present

## 2022-06-14 DIAGNOSIS — H9201 Otalgia, right ear: Secondary | ICD-10-CM | POA: Diagnosis not present

## 2022-06-14 DIAGNOSIS — J343 Hypertrophy of nasal turbinates: Secondary | ICD-10-CM | POA: Diagnosis not present

## 2022-06-15 ENCOUNTER — Other Ambulatory Visit (HOSPITAL_BASED_OUTPATIENT_CLINIC_OR_DEPARTMENT_OTHER): Payer: Self-pay

## 2022-06-15 DIAGNOSIS — F331 Major depressive disorder, recurrent, moderate: Secondary | ICD-10-CM | POA: Diagnosis not present

## 2022-06-15 DIAGNOSIS — F419 Anxiety disorder, unspecified: Secondary | ICD-10-CM | POA: Diagnosis not present

## 2022-06-15 MED ORDER — METHOCARBAMOL 750 MG PO TABS
750.0000 mg | ORAL_TABLET | Freq: Every day | ORAL | 4 refills | Status: DC
  Filled 2022-06-15 (×2): qty 30, 30d supply, fill #0

## 2022-06-27 DIAGNOSIS — F331 Major depressive disorder, recurrent, moderate: Secondary | ICD-10-CM | POA: Diagnosis not present

## 2022-06-27 DIAGNOSIS — F419 Anxiety disorder, unspecified: Secondary | ICD-10-CM | POA: Diagnosis not present

## 2022-07-05 ENCOUNTER — Other Ambulatory Visit (HOSPITAL_BASED_OUTPATIENT_CLINIC_OR_DEPARTMENT_OTHER): Payer: Self-pay

## 2022-07-05 ENCOUNTER — Ambulatory Visit: Payer: BC Managed Care – PPO | Admitting: Internal Medicine

## 2022-07-05 ENCOUNTER — Encounter: Payer: Self-pay | Admitting: Internal Medicine

## 2022-07-05 VITALS — BP 124/81 | HR 71 | Temp 98.3°F | Ht 66.0 in | Wt 194.8 lb

## 2022-07-05 DIAGNOSIS — J324 Chronic pansinusitis: Secondary | ICD-10-CM

## 2022-07-05 DIAGNOSIS — H6690 Otitis media, unspecified, unspecified ear: Secondary | ICD-10-CM

## 2022-07-05 DIAGNOSIS — L304 Erythema intertrigo: Secondary | ICD-10-CM | POA: Diagnosis not present

## 2022-07-05 DIAGNOSIS — H6993 Unspecified Eustachian tube disorder, bilateral: Secondary | ICD-10-CM | POA: Diagnosis not present

## 2022-07-05 MED ORDER — NYSTATIN 100000 UNIT/GM EX POWD
1.0000 | Freq: Three times a day (TID) | CUTANEOUS | 0 refills | Status: DC
  Filled 2022-07-05: qty 15, 5d supply, fill #0

## 2022-07-05 MED ORDER — SIMPLY SALINE 0.9 % NA AERS
2.0000 | INHALATION_SPRAY | NASAL | 11 refills | Status: AC
  Filled 2022-07-05 – 2022-07-12 (×2): qty 127, fill #0

## 2022-07-05 MED ORDER — AMOXICILLIN-POT CLAVULANATE 875-125 MG PO TABS
1.0000 | ORAL_TABLET | Freq: Two times a day (BID) | ORAL | 0 refills | Status: DC
  Filled 2022-07-05: qty 20, 10d supply, fill #0

## 2022-07-05 MED ORDER — PSEUDOEPHEDRINE HCL ER 120 MG PO TB12
120.0000 mg | ORAL_TABLET | Freq: Two times a day (BID) | ORAL | 0 refills | Status: DC
  Filled 2022-07-05: qty 20, 10d supply, fill #0

## 2022-07-05 MED ORDER — LORATADINE 10 MG PO TABS
10.0000 mg | ORAL_TABLET | Freq: Every day | ORAL | 11 refills | Status: DC
  Filled 2022-07-05: qty 90, 90d supply, fill #0

## 2022-07-05 NOTE — Patient Instructions (Signed)
This shows  how the eustachian canal works to drain the inner ear and how it is connected to the nasopharynx.  Nasal sinus rinses with saline nasal mist sprays can rinse all of the pollen and allergens and other irritants and pathogens out of his sinuses and nasopharynx, reducing the plugging/swelling around the eustachian tube.  The sinus rinse clears away the mucus and allows nasal steroid sprays to help reach the eustachian tube and reduce swellling to open it up.    Basic Sinus Care: Mist each nostril nightly with sterile saline nasal mist, then spray each nostril immediately after with fluticasone nasal spray (Flonase) or other steroid or antihistamine nasal pray Next, if symptoms persist, add a daily allergy pill.  Benadryl is the strongest but will make you very drowsy so only take when you can sleep after.  Ear Pressure Relief Maneuvers: Step 1 If the congestion is mild, you can often use simple maneuvers to quickly alter the pressure in your middle ear, such as: Swallowing Yawning Chewing gum Sucking on hard candy Similar methods can be used on children. If traveling with an infant or toddler, try giving them a bottle, pacifier, or something to drink or suck on.  Warm compress: Applying a warm, moist cloth to the back of your ear can help reduce swelling and help drain congested passages. In some cases, these interventions will cause the ears will pop without trying. If they don't, give it 20 minutes and see if swallowing, yawning, chewing gum, or sucking on hard candy helps.  Step 2 If these methods alone don't help, you can try other interventions like:  Decongestants: OTC drugs like Afrin (oxymetazoline) or Sudafed (pseudoephedrine) work by reducing the swelling of blood vessels in the nasal passages and Eustachian tubes.  Never use these medications for more than a few days at a time, especially afrin is dependency-forming  If this isn't working or you need more than a few days  of afrin or sudafed... you should make an appointment.   Step 3 (just for mod severe ear pressure and pain) If these interventions don't help, there are three other advanced strategies you can try called the Valsalva maneuver, the Toynbee maneuver, and the Frenzel maneuver.  Advanced Strategy 1:  The Valsalva maneuver Inhale. Pinch your nose shut with your fingers. Keeping your lips tightly shut, blow out forcefully as if you are blowing up a balloon. To increase the pressure, try bearing down as if having a bowel movement.  Advanced Strategy 2:  The Toynbee maneuver The Toynbee maneuver may also be safer than the Valsalva maneuver if you've had a previous eardrum injury. The Valsalva method exerts much more pressure on the eardrum and can possibly cause a rupture if you blow too forcefully.  Keep your mouth tightly shut. Pinch your nose shut with your fingers. Swallow hard.  Advanced Strategy 3: The Frenzel maneuver Pinch your nose shut with your fingers Close your mouth and place the tip of your tongue behind your upper front teeth. Push the back of your tongue to the roof of your mouth as if making a hard "G" or "K" sound. The back of your tongue will touch the roof. While doing this, close your vocal folds at the back of your throat and lift your larynx (voice box) up to push the air out of your mouth and into your nose.  ------------------------------------------------------------------------ If all this fails despite extensive efforts then you need to go to an ear nose and throat for   surgical correction - but this should not be tried until everything else fails.      

## 2022-07-05 NOTE — Progress Notes (Signed)
[facility]: [id number]   Routine Medical Office Visit  Patient:  Shirley Fleming      Age: 47 y.o.       Sex:  female  Date:   07/05/2022  PCP:    [name], [place] Provider: Loralee Pacas, MD   Assessment and Plan:   Shirley Fleming was seen today for severe sinus pain, nasal congestion and migraine.  Chronic pansinusitis -     Simply Saline; Place 2 each into the nose as directed. Use nightly for sinus hygiene long-term.  Can also be used as many times daily as desired to assist with clearing congested sinuses.  Dispense: 127 mL; Refill: 11 -     Loratadine; Take 1 tablet (10 mg total) by mouth daily.  Dispense: 30 tablet; Refill: 11 -     Pseudoephedrine HCl ER; Take 1 tablet (120 mg total) by mouth 2 (two) times daily.  Dispense: 20 tablet; Refill: 0 -     Amoxicillin-Pot Clavulanate; Take 1 tablet by mouth 2 (two) times daily.  Dispense: 20 tablet; Refill: 0  Intertrigo -     Nystatin; Apply 1 Application topically 3 (three) times daily.  Dispense: 15 g; Refill: 0  Dysfunction of both eustachian tubes  Chronic otitis media, unspecified otitis media type  Handouts placed to MyChart:   See AVS for more info discussed    Clinical Presentation:   The patient is a 47 y.o. female: Active Ambulatory Problems    Diagnosis Date Noted   Vitamin D deficiency 05/24/2020   Primary hypertension 05/24/2020   Migraine with aura and without status migrainosus, not intractable 05/24/2020   Anxiety 05/24/2020   Genetic testing 09/22/2021   At high risk for breast cancer 10/31/2021   Resolved Ambulatory Problems    Diagnosis Date Noted   No Resolved Ambulatory Problems   Past Medical History:  Diagnosis Date   Allergy    Asthma    Depression 1995   History of COVID-19 12/23/2020   Hypertension Jan 2021   Hypoglycemia    Migraine with aura     Outpatient Medications Prior to Visit  Medication Sig   albuterol (VENTOLIN HFA) 108 (90  Base) MCG/ACT inhaler Inhale 1-2 puffs into the lungs every 6 (six) hours as needed.   ALPRAZolam (XANAX) 0.25 MG tablet Take 1 tablet (0.25 mg total) by mouth every 8 (eight) hours as needed.   B Complex-Biotin-FA (B-COMPLEX PO) Take 0.5 tablets by mouth daily in the afternoon.   cholecalciferol (VITAMIN D3) 25 MCG (1000 UT) tablet Take 1,000 Units by mouth daily.   ferrous sulfate 324 MG TBEC Take 65 mg by mouth.   FLUoxetine (PROZAC) 20 MG capsule Take 1 capsule (20 mg total) by mouth daily.   fluticasone (FLONASE) 50 MCG/ACT nasal spray Place 1 spray into both nostrils daily.   magnesium 30 MG tablet Take 30 mg by mouth daily.   methocarbamol (ROBAXIN) 750 MG tablet Take 1 tablet (750 mg total) by mouth at bedtime.   Multiple Vitamin (MULTIVITAMIN) tablet Take 1 tablet by mouth daily.   norethindrone (MICRONOR) 0.35 MG tablet Take 1 tablet (0.35 mg total) by mouth daily.   propranolol (INDERAL) 10 MG tablet TAKE 1 TABLET BY MOUTH EVERY 12 HOURS AS NEEDED   topiramate (TOPAMAX) 25 MG tablet Take 1 tablet (25 mg) at bedtime for 1 week, then increase to 2 tablets (50 mg) at bedtime thereafter.   No facility-administered medications prior to visit.  Chief Complaint  Patient presents with   Severe sinus pain    Face, head and neck pain. Taken Mucinex, Theraflu, Flonase, steaming her face and ibuprofen with some temporary relief.   Nasal Congestion    Producing yellowish/greenish mucus when blowing nose.   Migraine    Sinus symptoms started about three or four days ago.    HPI  In addition to chief complaint of chronic sinus congestion and pain with ear, nose symptom(s), she has developed rash under breasts from being ill Recent illness ran though household but she has had it worse and more extensively.       Clinical Data Analysis:  Physical Exam  BP 124/81 (BP Location: Left Arm, Patient Position: Sitting)   Pulse 71   Temp 98.3 F (36.8 C) (Temporal)   Ht '5\' 6"'$  (1.676 m)    Wt 194 lb 12.8 oz (88.4 kg)   SpO2 97%   BMI 31.44 kg/m  Wt Readings from Last 10 Encounters:  07/05/22 194 lb 12.8 oz (88.4 kg)  06/07/22 198 lb (89.8 kg)  04/05/22 192 lb 12.8 oz (87.5 kg)  02/28/22 194 lb (88 kg)  02/13/22 194 lb (88 kg)  01/27/22 194 lb 8 oz (88.2 kg)  01/04/22 194 lb 6.1 oz (88.2 kg)  11/29/21 194 lb 4 oz (88.1 kg)  11/03/21 195 lb 8 oz (88.7 kg)  10/31/21 196 lb 3.2 oz (89 kg)   Vital signs reviewed.  Nursing notes reviewed. Weight trend reviewed. Abnormalities noted: weight stable, vitals normal, Body mass index is 31.44 kg/m. Not relevant to complaint BMI is an unreliable indicator of healthy body composition due to its inability to reflect lean muscle mass.  General Appearance:  Well developed, well nourished female in no acute distress.   Pulmonary:  Normal work of breathing at rest, no respiratory distress apparent. SpO2: 97 %  Musculoskeletal: All extremities are intact.  Neurological:  Awake, alert. No obvious focal neurological deficits or cognitive impairments.  Sensorium seems unclouded. Psychiatric:  Appropriate mood, pleasant demeanor Problem-specific findings:  very red nasal mucosa, uvula drifts to left chronoically per patient, also patient  has bulging right tympanic membrane , retracted  left tympanic membrane, intertriginous faint erythema consistent with candidal skin infection noted when examined with chaperone Tiffany Leath.   Results Reviewed: (reviewed labs/imaging may be also be found in the assessment / plan section):     No results found for any visits on 07/05/22.  Recent Results (from the past 2160 hour(s))  Glucose, CSF     Status: None   Collection Time: 05/24/22 11:34 AM  Result Value Ref Range   Glucose, CSF 66 40 - 80 mg/dL  Protein, CSF     Status: None   Collection Time: 05/24/22 11:34 AM  Result Value Ref Range   Total Protein, CSF 37 15 - 45 mg/dL  CSF cell count with differential     Status: None   Collection Time:  05/24/22 11:34 AM  Result Value Ref Range   Color, CSF COLORLESS COLORLESS   Appearance, CSF CLEAR CLEAR   RBC Count, CSF 0 0 cells/uL   TOTAL NUCLEATED CELL 1 0 - 5 cells/uL   Monocyte/Macrophage CANCELED     Comment: Result canceled by the ancillary.    No image results found.        Signed: Loralee Pacas, MD 07/05/2022 2:25 PM

## 2022-07-06 ENCOUNTER — Ambulatory Visit: Payer: BC Managed Care – PPO | Admitting: Physician Assistant

## 2022-07-12 ENCOUNTER — Other Ambulatory Visit (HOSPITAL_BASED_OUTPATIENT_CLINIC_OR_DEPARTMENT_OTHER): Payer: Self-pay

## 2022-07-12 ENCOUNTER — Other Ambulatory Visit: Payer: Self-pay

## 2022-07-13 DIAGNOSIS — F331 Major depressive disorder, recurrent, moderate: Secondary | ICD-10-CM | POA: Diagnosis not present

## 2022-07-13 DIAGNOSIS — F419 Anxiety disorder, unspecified: Secondary | ICD-10-CM | POA: Diagnosis not present

## 2022-07-20 ENCOUNTER — Encounter: Payer: Self-pay | Admitting: Neurology

## 2022-07-20 NOTE — Progress Notes (Signed)
NEUROLOGY FOLLOW UP OFFICE NOTE  Shirley Fleming [id number]  Subjective:  Shirley Fleming is a 47 y.o. year old right-handed female with a medical history of HTN, migraine, asthma, depression who we last saw on 06/07/22.  To briefly review: Patient has pain in and around right ear and along jaw when chewing that started 01/23/22 while traveling. Her symptoms have mostly improved. She had a work up included a maxillofacial CT which showed an incidental partially empty sella. Given her pressure headaches and potential concern for IIH, patient was referred to neurology.   Patient has seen ENT. He thinks patient has inner ear problems (?pressure).   In terms of headaches, she has a history of migraines. She had sinus headaches as a child. She started getting migraines after having her son. She has pressure in her head, usually one sided, that will switch after a 18 hours and continue for 18 hours on the other side. She endorses photophobia, phonophobia, nausea. She also is sensitive to smells. She would normally get the headaches with her cycle. Of late, it has been more random, maybe none for 2 months, then maybe 2 per month. They typically last from 1-3 days. She can have head pain. She can take ibuprofen that sometimes helps. She has never tried a migraine medication. She will lay down in a dark room and lay on the side of the headache for help. Her husband may also rub her neck and give some relief. She can wake up with headaches. Getting up and moving can help. Flonase can also help.   She endorses vision changes for about 1 year ago. She had COVID around that time. She has noticed her vision is worse. She will feel like sometimes one eye won't see correctly. She will blink and wait and things will get better. She was seen by her eye doctor who dilated her eyes and said that looked okay, but that she needed new prescription. She feels like things are not as bright as usual as well, but can be too bright  when having a migraine.   Patient is on B-complex and multivitamin. She is on propranolol as needed due to occasional tachycardia after COVID. She takes Prozac for depression and xanax as needed.   EtOH: 1 glass of wine at night Caffeine: 2-3 glasses of coffee per day   06/07/22: Labs were significant for elevated B vitamins including B6. I recommended she stop any supplements, which she did.    MRI brain on 05/07/21 again showed a partially empty sella but was otherwise remarkable. She had a lumbar puncture on 05/24/22 with an opening pressure of 17 cm of water. Routine analysis was normal. She was headache free for a few days. The headaches returned but did not have a positional component to them. She thinks the vision might have been a little better for a few days as well.   She had low back pain and nausea for a couple of weeks after LP.   Since 03/2022 visit, patient has had 3-4 headaches. They tend to cluster on one side of the face at a time, but can be either side. She has nausea, photophobia, and phonophobia. She is taking propranolol, but this has not helped with headaches. She will take ibuprofen for headaches, which sometimes helps.   She does not have a history of kidney stones.  Most recent Assessment and Plan (06/07/22): This is Shirley Fleming, a 47 y.o. female with headaches. There was initially concern for positional component  to headache (IIH), but LP without evidence of elevated pressure. She did have some relief of symptoms after LP though. Today her headaches sound more migrainous in nature. I will start topiramate as it can both prevent migraines and is a treatment of IIH.    Plan: -Start topiramate 25 mg daily for 1 week, then increase to 50 mg daily -Will continue NSAIDs for acute treatment, but will consider triptan if not working  Since their last visit: Patient was not able to tolerate Topamax (feeling off, body aches, URI like symptoms). She weaned off starting  07/20/22. She stated her body temp was 95.63F despite everyone else in her family being normal. The next day after stopping, her symptoms went away. She was also very fatigued, which improved after stopping the medication.   Topamax did help with headaches. She had one day of non-debilitating headache while on it. She had improvement of neck pain while on Topamax as well.  She has "wave like feeling" of a headache coming on, but then going away. It is not with changing position though. Laying down does not change symptoms. She had one day of aura with visual spots. She usually has headaches that cluster, particularly around her menstrual cycle.   Of note, patient currently takes Prozac 20 mg daily for depression and propranolol 10 mg daily for racing heart.  She did PT for her neck in the past, but many years ago.  MEDICATIONS:  Outpatient Encounter Medications as of 07/25/2022  Medication Sig   albuterol (VENTOLIN HFA) 108 (90 Base) MCG/ACT inhaler Inhale 1-2 puffs into the lungs every 6 (six) hours as needed.   ALPRAZolam (XANAX) 0.25 MG tablet Take 1 tablet (0.25 mg total) by mouth every 8 (eight) hours as needed.   B Complex-Biotin-FA (B-COMPLEX PO) Take 0.5 tablets by mouth daily in the afternoon.   cholecalciferol (VITAMIN D3) 25 MCG (1000 UT) tablet Take 1,000 Units by mouth daily.   ferrous sulfate 324 MG TBEC Take 65 mg by mouth.   FLUoxetine (PROZAC) 20 MG capsule Take 1 capsule (20 mg total) by mouth daily.   fluticasone (FLONASE) 50 MCG/ACT nasal spray Place 1 spray into both nostrils daily.   loratadine (CLARITIN) 10 MG tablet Take 1 tablet (10 mg total) by mouth daily. (Patient taking differently: Take 10 mg by mouth daily as needed.)   magnesium 30 MG tablet Take 30 mg by mouth daily.   Multiple Vitamin (MULTIVITAMIN) tablet Take 1 tablet by mouth daily.   norethindrone (MICRONOR) 0.35 MG tablet Take 1 tablet (0.35 mg total) by mouth daily.   propranolol (INDERAL) 10 MG tablet TAKE  1 TABLET BY MOUTH EVERY 12 HOURS AS NEEDED   Saline (SIMPLY SALINE) 0.9 % AERS Place 2 each into the nose as directed. Use nightly for sinus hygiene long-term.  Can also be used as many times daily as desired to assist with clearing congested sinuses.   amoxicillin-clavulanate (AUGMENTIN) 875-125 MG tablet Take 1 tablet by mouth 2 (two) times daily.   methocarbamol (ROBAXIN) 750 MG tablet Take 1 tablet (750 mg total) by mouth at bedtime. (Patient not taking: Reported on 07/25/2022)   nystatin (MYCOSTATIN/NYSTOP) powder Apply 1 Application topically 3 (three) times daily.   pseudoephedrine (SUDAFED) 120 MG 12 hr tablet Take 1 tablet (120 mg total) by mouth 2 (two) times daily.   topiramate (TOPAMAX) 25 MG tablet Take 1 tablet (25 mg) at bedtime for 1 week, then increase to 2 tablets (50 mg) at bedtime thereafter. (Patient  not taking: Reported on 07/25/2022)   No facility-administered encounter medications on file as of 07/25/2022.    PAST MEDICAL HISTORY: Past Medical History:  Diagnosis Date   Allergy    Anxiety    Asthma    Depression 1995   Off and on since teens   History of COVID-19 12/23/2020   Hypertension Jan 2021   Hypoglycemia    Migraine with aura     PAST SURGICAL HISTORY: Past Surgical History:  Procedure Laterality Date   APPENDECTOMY     TONSILLECTOMY AND ADENOIDECTOMY      ALLERGIES: Allergies  Allergen Reactions   Doxycycline Nausea Only   Erythromycin Nausea And Vomiting    FAMILY HISTORY: Family History  Problem Relation Age of Onset   COPD Mother    Depression Mother    Heart attack Father 21   Hypertension Father    Heart disease Father    Breast cancer Maternal Grandmother        dx 88s, d. 15s   Cancer Maternal Grandmother    Heart attack Maternal Grandfather    Heart disease Maternal Grandfather    Breast cancer Paternal Grandmother        dx 6s; + vaginal cancer 104s, jaw cancer 38s   Cancer Paternal Grandmother    Heart attack Paternal  Grandfather    ADD / ADHD Son    Other Son        hypermobile EDS   Breast cancer Maternal Aunt        dx mid 42s   Diabetes Maternal Aunt    Hypertension Maternal Aunt    Cancer Maternal Aunt    Depression Maternal Aunt    Hypertension Maternal Aunt     SOCIAL HISTORY: Social History   Tobacco Use   Smoking status: Former    Years: 15    Types: Cigarettes    Quit date: 04/24/2005    Years since quitting: 17.2    Passive exposure: Never   Smokeless tobacco: Never  Vaping Use   Vaping Use: Never used  Substance Use Topics   Alcohol use: Yes    Alcohol/week: 2.0 standard drinks of alcohol    Types: 2 Glasses of wine per week   Drug use: Never   Social History   Social History Narrative   Married   Two children    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 Right   Are you currently employed ? yes   What is your current occupation? home   Do you live at home alone? Husband and kids and Dad   Who lives with you?    What type of home do you live in: 1 story or 2 story? two    Caffeine 2-3 day      Objective:  Vital Signs:  BP 113/75   Pulse 72   Ht 5\' 6"  (1.676 m)   Wt 198 lb (89.8 kg)   SpO2 97%   BMI 31.96 kg/m   General: No acute distress.  Patient appears well-groomed.   Head:  Normocephalic/atraumatic Eyes:  fundi examined, disc margins crisp, no obvious papilledema Neck: supple  Neurological Exam: Mental status: alert and oriented, speech fluent and not dysarthric, language intact.  Cranial nerves: CN I: not tested CN II: pupils equal, round and reactive to light, visual fields intact CN III, IV, VI:  full range of motion, no nystagmus, no  ptosis CN V: facial sensation intact. CN VII: upper and lower face symmetric CN VIII: hearing intact CN IX, X: uvula midline CN XI: sternocleidomastoid and trapezius muscles intact CN XII: tongue midline  Bulk & Tone: normal, no  fasciculations. Motor:  muscle strength 5/5 throughout Deep Tendon Reflexes:  2+ throughout.   Sensation:  Light touch sensation intact. Finger to nose testing:  Without dysmetria.   Gait:  Normal station and stride.   Labs and Imaging review: No new results  Previously reviewed results: 04/05/22: B6: 36.7 B1: 38 B12: 1005 CSF: 0 R, 1 W, 37 P, 66 G; opening pressure 17 cm of water  Lab Results  Component Value Date    TSH 2.03 08/05/2021       CBC and CMP wnl on 11/29/21 Vit D wnl on 05/24/20  CT maxillofacial w contrast (01/27/22): FINDINGS: Osseous: No acute fracture or aggressive osseous lesion identified.   Orbits: No orbital mass or acute orbital finding.   Sinuses: No significant paranasal sinus disease.   Soft tissues: Asymmetric soft tissue thickening and enhancement at the lateral aspect of the right external auditory canal.   Limited intracranial: No evidence of acute intracranial abnormality within the field of view. Partially empty sella turcica.   Other: The middle ear cavities appear normally aerated. No significant mastoid effusion. Streak and beam hardening artifact arising from dental restoration partially obscures the oral cavity.   IMPRESSION: Asymmetric soft tissue thickening and enhancement at the lateral aspect of the right external auditory canal, nonspecific but suspicious for acute otitis externa given the provided history. Direct visualization is recommended.   Partially empty sella turcica. While this finding often reflects incidental anatomic variation, it can also be associated with idiopathic intracranial hypertension (pseudotumor cerebri).   MRI brain wo contrast (05/07/22): FINDINGS: Brain:   No age advanced or lobar predominant parenchymal atrophy.   Partially empty sella turcica.   No cortical encephalomalacia is identified. No significant cerebral white matter disease.   There is no acute infarct.   No evidence of an  intracranial mass.   No chronic intracranial blood products.   No extra-axial fluid collection.   No midline shift.   Vascular: Maintained flow voids within the proximal large arterial vessels.   Skull and upper cervical spine: No focal suspicious marrow lesion.   Sinuses/Orbits: No mass or acute finding within the imaged orbits. No significant paranasal sinus disease.   IMPRESSION: 1. No evidence of an intracranial mass or acute infarct. 2. Partially empty sella turcica. This finding can reflect incidental anatomic variation, or alternatively, it can be associated with idiopathic intracranial hypertension (pseudotumor cerebri). 3. Otherwise unremarkable non-contrast MRI appearance of the brain.  Assessment/Plan:  This is Samanthamarie Galuska, a 47 y.o. female with headaches most consistent with episodic migraine with aura. At initial consultation, there was concern for a positional component to the headache, which has resolved. MRI brain did show a partially empty sella, but LP showed a normal opening pressure.  Patient was initially started on Topamax, which helped headaches, but were followed by side effects of fatigue, feeling off, body aches, and low body temperature that resolved after stopping Topamax. She is currently getting headache clusters, maybe 2 per month on average. She has only taken ibuprofen for rescue with mild relief. We discussed adding a rescue medication, Sumatriptan, which patient agreed with. We also discussed other preventative medications including increasing her Prozac or propranolol. Patient preferred to start with sumatriptan and see how things go as her  headaches are not too frequent. She also has neck pain that is likely contributing. She would like to hold off on PT for her neck currently due to busy schedule.  Plan: -Stop topamax -Will consider PT for neck if needed, patient preferred to wait -Migraine prevention:  Discussed, but will hold off per patient  preference -Migraine rescue:  Sumatriptan 100 mg PRN at headache onset -Limit use of pain relievers to no more than 2 days out of week to prevent risk of rebound or medication-overuse headache. -Keep headache diary -Follow up - will keep scheduled f/u on 09/13/22  Total time spent reviewing records, interview, history/exam, documentation, and coordination of care on day of encounter:  76 min  Kai Levins, MD

## 2022-07-25 ENCOUNTER — Ambulatory Visit (INDEPENDENT_AMBULATORY_CARE_PROVIDER_SITE_OTHER): Payer: BC Managed Care – PPO | Admitting: Neurology

## 2022-07-25 ENCOUNTER — Other Ambulatory Visit (HOSPITAL_BASED_OUTPATIENT_CLINIC_OR_DEPARTMENT_OTHER): Payer: Self-pay

## 2022-07-25 ENCOUNTER — Encounter: Payer: Self-pay | Admitting: Neurology

## 2022-07-25 VITALS — BP 113/75 | HR 72 | Ht 66.0 in | Wt 198.0 lb

## 2022-07-25 DIAGNOSIS — M542 Cervicalgia: Secondary | ICD-10-CM | POA: Diagnosis not present

## 2022-07-25 DIAGNOSIS — G43009 Migraine without aura, not intractable, without status migrainosus: Secondary | ICD-10-CM | POA: Diagnosis not present

## 2022-07-25 MED ORDER — SUMATRIPTAN SUCCINATE 100 MG PO TABS
100.0000 mg | ORAL_TABLET | Freq: Once | ORAL | 11 refills | Status: DC | PRN
  Filled 2022-07-25: qty 10, 5d supply, fill #0

## 2022-07-25 NOTE — Patient Instructions (Signed)
Stop the topamax.  I will start a migraine rescue medication called Sumatriptan (Imitrex). You will take this as needed at the onset of headache. You can repeat after 2 hours if needed.  Let me know if this does not work or if you are having worsening or increasing number of headaches.  We will keep your follow up appointment for 09/13/22 as scheduled.  Please let me know if you have any questions or concerns in the meantime.   The physicians and staff at [facility] are committed to providing excellent care. You may receive a survey requesting feedback about your experience at our office. We strive to receive "very good" responses to the survey questions. If you feel that your experience would prevent you from giving the office a "very good " response, please contact our office to try to remedy the situation. We may be reached at [phone]. Thank you for taking the time out of your busy day to complete the survey.  Kai Levins, MD [facility]

## 2022-07-28 DIAGNOSIS — F419 Anxiety disorder, unspecified: Secondary | ICD-10-CM | POA: Diagnosis not present

## 2022-07-28 DIAGNOSIS — F331 Major depressive disorder, recurrent, moderate: Secondary | ICD-10-CM | POA: Diagnosis not present

## 2022-08-05 ENCOUNTER — Other Ambulatory Visit: Payer: Self-pay | Admitting: Obstetrics and Gynecology

## 2022-08-05 DIAGNOSIS — Z3041 Encounter for surveillance of contraceptive pills: Secondary | ICD-10-CM

## 2022-08-07 ENCOUNTER — Encounter: Payer: Self-pay | Admitting: Obstetrics and Gynecology

## 2022-08-07 DIAGNOSIS — F331 Major depressive disorder, recurrent, moderate: Secondary | ICD-10-CM | POA: Diagnosis not present

## 2022-08-07 DIAGNOSIS — F419 Anxiety disorder, unspecified: Secondary | ICD-10-CM | POA: Diagnosis not present

## 2022-08-07 NOTE — Telephone Encounter (Signed)
Not yet scheduled for AEX. Message sent to Appt Pool to schedule AEX prior to refill.

## 2022-08-08 ENCOUNTER — Other Ambulatory Visit (HOSPITAL_BASED_OUTPATIENT_CLINIC_OR_DEPARTMENT_OTHER): Payer: Self-pay

## 2022-08-08 MED ORDER — NORETHINDRONE 0.35 MG PO TABS
1.0000 | ORAL_TABLET | Freq: Every day | ORAL | 0 refills | Status: DC
  Filled 2022-08-08: qty 28, 28d supply, fill #0
  Filled 2022-09-07: qty 28, 28d supply, fill #1
  Filled 2022-10-04: qty 28, 28d supply, fill #2

## 2022-08-08 NOTE — Telephone Encounter (Signed)
Last AEX 08/05/2021--nothing scheduled at the moment. Per mychart msg 08/07/2022--appt desk to reach out to pt to schedule.  M-11/08/2021-neg birads 1, Breast MRI-05/03/2022-neg birads 1   Rx pend. Will keep encounter until appt scheduled.

## 2022-08-08 NOTE — Telephone Encounter (Signed)
AEX now scheduled for 10/25/22.

## 2022-08-09 ENCOUNTER — Other Ambulatory Visit (HOSPITAL_BASED_OUTPATIENT_CLINIC_OR_DEPARTMENT_OTHER): Payer: Self-pay

## 2022-08-10 NOTE — Telephone Encounter (Signed)
Per review of EPIC, patient has an AEX scheduled for 10/25/22.   Encounter closed.  

## 2022-08-10 NOTE — Telephone Encounter (Signed)
Per review of EPIC, patient has an AEX scheduled for 10/25/22.   Encounter closed.

## 2022-08-11 ENCOUNTER — Other Ambulatory Visit (HOSPITAL_BASED_OUTPATIENT_CLINIC_OR_DEPARTMENT_OTHER): Payer: Self-pay

## 2022-08-11 ENCOUNTER — Encounter: Payer: Self-pay | Admitting: Physician Assistant

## 2022-08-11 DIAGNOSIS — L304 Erythema intertrigo: Secondary | ICD-10-CM

## 2022-08-11 MED ORDER — PROPRANOLOL HCL 10 MG PO TABS
10.0000 mg | ORAL_TABLET | Freq: Two times a day (BID) | ORAL | 0 refills | Status: DC | PRN
  Filled 2022-08-11: qty 60, 30d supply, fill #0

## 2022-08-11 MED ORDER — FLUOXETINE HCL 20 MG PO CAPS
20.0000 mg | ORAL_CAPSULE | Freq: Every day | ORAL | 1 refills | Status: DC
  Filled 2022-08-11: qty 30, 30d supply, fill #0
  Filled 2022-09-26: qty 30, 30d supply, fill #1
  Filled 2022-10-22: qty 30, 30d supply, fill #2
  Filled 2022-11-26: qty 30, 30d supply, fill #3
  Filled 2022-12-29: qty 30, 30d supply, fill #4
  Filled 2023-02-08: qty 30, 30d supply, fill #5

## 2022-08-11 NOTE — Telephone Encounter (Signed)
Please see message. I have sent refills for both medications.

## 2022-08-21 DIAGNOSIS — F331 Major depressive disorder, recurrent, moderate: Secondary | ICD-10-CM | POA: Diagnosis not present

## 2022-08-21 DIAGNOSIS — F419 Anxiety disorder, unspecified: Secondary | ICD-10-CM | POA: Diagnosis not present

## 2022-09-04 DIAGNOSIS — F331 Major depressive disorder, recurrent, moderate: Secondary | ICD-10-CM | POA: Diagnosis not present

## 2022-09-04 DIAGNOSIS — F419 Anxiety disorder, unspecified: Secondary | ICD-10-CM | POA: Diagnosis not present

## 2022-09-04 NOTE — Progress Notes (Signed)
NEUROLOGY FOLLOW UP OFFICE NOTE  Shirley Fleming [id number]  Subjective:  Shirley Fleming is a 47 y.o. year old right-handed female with a medical history of HTN, migraine, asthma, depression who we last saw on 07/25/22.  To briefly review: Patient has pain in and around right ear and along jaw when chewing that started 01/23/22 while traveling. Her symptoms have mostly improved. She had a work up included a maxillofacial CT which showed an incidental partially empty sella. Given her pressure headaches and potential concern for IIH, patient was referred to neurology.   Patient has seen ENT. He thinks patient has inner ear problems (?pressure).   In terms of headaches, she has a history of migraines. She had sinus headaches as a child. She started getting migraines after having her son. She has pressure in her head, usually one sided, that will switch after a 18 hours and continue for 18 hours on the other side. She endorses photophobia, phonophobia, nausea. She also is sensitive to smells. She would normally get the headaches with her cycle. Of late, it has been more random, maybe none for 2 months, then maybe 2 per month. They typically last from 1-3 days. She can have head pain. She can take ibuprofen that sometimes helps. She has never tried a migraine medication. She will lay down in a dark room and lay on the side of the headache for help. Her husband may also rub her neck and give some relief. She can wake up with headaches. Getting up and moving can help. Flonase can also help.   She endorses vision changes for about 1 year ago. She had COVID around that time. She has noticed her vision is worse. She will feel like sometimes one eye won't see correctly. She will blink and wait and things will get better. She was seen by her eye doctor who dilated her eyes and said that looked okay, but that she needed new prescription. She feels like things are not as bright as usual as well, but can be too bright  when having a migraine.   Patient is on B-complex and multivitamin. She is on propranolol as needed due to occasional tachycardia after COVID. She takes Prozac for depression and xanax as needed.   EtOH: 1 glass of wine at night Caffeine: 2-3 glasses of coffee per day    06/07/22: Labs were significant for elevated B vitamins including B6. I recommended she stop any supplements, which she did.    MRI brain on 05/07/21 again showed a partially empty sella but was otherwise remarkable. She had a lumbar puncture on 05/24/22 with an opening pressure of 17 cm of water. Routine analysis was normal. She was headache free for a few days. The headaches returned but did not have a positional component to them. She thinks the vision might have been a little better for a few days as well.   She had low back pain and nausea for a couple of weeks after LP.   Since 03/2022 visit, patient has had 3-4 headaches. They tend to cluster on one side of the face at a time, but can be either side. She has nausea, photophobia, and phonophobia. She is taking propranolol, but this has not helped with headaches. She will take ibuprofen for headaches, which sometimes helps.   She does not have a history of kidney stones.  07/25/22: Patient was not able to tolerate Topamax (feeling off, body aches, URI like symptoms). She weaned off starting 07/20/22. She stated  her body temp was 95.27F despite everyone else in her family being normal. The next day after stopping, her symptoms went away. She was also very fatigued, which improved after stopping the medication.    Topamax did help with headaches. She had one day of non-debilitating headache while on it. She had improvement of neck pain while on Topamax as well.   She has "wave like feeling" of a headache coming on, but then going away. It is not with changing position though. Laying down does not change symptoms. She had one day of aura with visual spots. She usually has headaches  that cluster, particularly around her menstrual cycle.    Of note, patient currently takes Prozac 20 mg daily for depression and propranolol 10 mg daily for racing heart.   She did PT for her neck in the past, but many years ago.  Most recent Assessment and Plan (07/25/22): This is Shirley Fleming, a 47 y.o. female with headaches most consistent with episodic migraine with aura. At initial consultation, there was concern for a positional component to the headache, which has resolved. MRI brain did show a partially empty sella, but LP showed a normal opening pressure.   Patient was initially started on Topamax, which helped headaches, but were followed by side effects of fatigue, feeling off, body aches, and low body temperature that resolved after stopping Topamax. She is currently getting headache clusters, maybe 2 per month on average. She has only taken ibuprofen for rescue with mild relief. We discussed adding a rescue medication, Sumatriptan, which patient agreed with. We also discussed other preventative medications including increasing her Prozac or propranolol. Patient preferred to start with sumatriptan and see how things go as her headaches are not too frequent. She also has neck pain that is likely contributing. She would like to hold off on PT for her neck currently due to busy schedule.   Plan: -Stop topamax -Will consider PT for neck if needed, patient preferred to wait -Migraine prevention:  Discussed, but will hold off per patient preference -Migraine rescue:  Sumatriptan 100 mg PRN at headache onset -Limit use of pain relievers to no more than 2 days out of week to prevent risk of rebound or medication-overuse headache. -Keep headache diary -Follow up - will keep scheduled f/u on 09/13/22  Since their last visit: Since last visit, patient has had 2 headaches. She had one really bad headache and took Imitrex, but the headache did not respond. The headache lasted 24-48 hours. She had  another headache while out of town that she managed with ibuprofen. She is still more likely to get a headache around her cycle.  She continues to have neck pain as well.  MEDICATIONS:  Outpatient Encounter Medications as of 09/13/2022  Medication Sig   albuterol (VENTOLIN HFA) 108 (90 Base) MCG/ACT inhaler Inhale 1-2 puffs into the lungs every 6 (six) hours as needed.   ALPRAZolam (XANAX) 0.25 MG tablet Take 1 tablet (0.25 mg total) by mouth every 8 (eight) hours as needed.   B Complex-Biotin-FA (B-COMPLEX PO) Take 0.5 tablets by mouth daily in the afternoon.   cholecalciferol (VITAMIN D3) 25 MCG (1000 UT) tablet Take 1,000 Units by mouth daily.   ferrous sulfate 324 MG TBEC Take 65 mg by mouth.   FLUoxetine (PROZAC) 20 MG capsule Take 1 capsule (20 mg total) by mouth daily.   fluticasone (FLONASE) 50 MCG/ACT nasal spray Place 1 spray into both nostrils daily.   magnesium 30 MG tablet Take 30  mg by mouth daily.   Multiple Vitamin (MULTIVITAMIN) tablet Take 1 tablet by mouth daily.   norethindrone (INCASSIA) 0.35 MG tablet Take 1 tablet (0.35 mg total) by mouth daily.   propranolol (INDERAL) 10 MG tablet Take 1 tablet (10 mg total) by mouth every 12 (twelve) hours as needed.   Saline (SIMPLY SALINE) 0.9 % AERS Place 2 each into the nose as directed. Use nightly for sinus hygiene long-term.  Can also be used as many times daily as desired to assist with clearing congested sinuses.   SUMAtriptan (IMITREX) 100 MG tablet Take 1 tablet (100 mg total) by mouth once as needed for up to 1 dose for migraine. May repeat in 2 hours if headache persists or recurs.   loratadine (CLARITIN) 10 MG tablet Take 1 tablet (10 mg total) by mouth daily. (Patient not taking: Reported on 09/13/2022)   methocarbamol (ROBAXIN) 750 MG tablet Take 1 tablet (750 mg total) by mouth at bedtime. (Patient not taking: Reported on 07/25/2022)   No facility-administered encounter medications on file as of 09/13/2022.    PAST  MEDICAL HISTORY: Past Medical History:  Diagnosis Date   Allergy    Anxiety    Asthma    Depression 1995   Off and on since teens   History of COVID-19 12/23/2020   Hypertension Jan 2021   Hypoglycemia    Migraine with aura     PAST SURGICAL HISTORY: Past Surgical History:  Procedure Laterality Date   APPENDECTOMY     TONSILLECTOMY AND ADENOIDECTOMY      ALLERGIES: Allergies  Allergen Reactions   Doxycycline Nausea Only   Erythromycin Nausea And Vomiting    FAMILY HISTORY: Family History  Problem Relation Age of Onset   COPD Mother    Depression Mother    Heart attack Father 67   Hypertension Father    Heart disease Father    Breast cancer Maternal Grandmother        dx 30s, d. 30s   Cancer Maternal Grandmother    Heart attack Maternal Grandfather    Heart disease Maternal Grandfather    Breast cancer Paternal Grandmother        dx 55s; + vaginal cancer 60s, jaw cancer 27s   Cancer Paternal Grandmother    Heart attack Paternal Grandfather    ADD / ADHD Son    Other Son        hypermobile EDS   Breast cancer Maternal Aunt        dx mid 3s   Diabetes Maternal Aunt    Hypertension Maternal Aunt    Cancer Maternal Aunt    Depression Maternal Aunt    Hypertension Maternal Aunt     SOCIAL HISTORY: Social History   Tobacco Use   Smoking status: Former    Years: 15    Types: Cigarettes    Quit date: 04/24/2005    Years since quitting: 17.4    Passive exposure: Never   Smokeless tobacco: Never  Vaping Use   Vaping Use: Never used  Substance Use Topics   Alcohol use: Yes    Alcohol/week: 2.0 standard drinks of alcohol    Types: 2 Glasses of wine per week   Drug use: Never   Social History   Social History Narrative   Married   Two children    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 Right   Are  you currently employed ? yes   What is your current occupation? home   Do you  live at home alone? Husband and kids and Dad   Who lives with you?    What type of home do you live in: 1 story or 2 story? two    Caffeine 2-3 day      Objective:  Vital Signs:  BP 135/71   Pulse 96   Resp 16   Ht 5\' 6"  (1.676 m)   Wt 199 lb 3.2 oz (90.4 kg)   SpO2 100%   BMI 32.15 kg/m   General: No acute distress.  Patient appears well-groomed.   Head:  Normocephalic/atraumatic Eyes:  Fundi examined. Disc margins crisp. No obvious papilledema. Neck: supple Neurological Exam: alert and oriented.  Speech fluent and not dysarthric, language intact.  CN II-XII intact. Bulk and tone normal, muscle strength 5/5 throughout.  Sensation to light touch intact.  Deep tendon reflexes 2+ throughout. Gait normal.   Labs and Imaging review: No new results   Previously reviewed results: 04/05/22: B6: 36.7 B1: 38 B12: 1005 CSF: 0 R, 1 W, 37 P, 66 G; opening pressure 17 cm of water        Lab Results  Component Value Date    TSH 2.03 08/05/2021       CBC and CMP wnl on 11/29/21 Vit D wnl on 05/24/20   CT maxillofacial w contrast (01/27/22): FINDINGS: Osseous: No acute fracture or aggressive osseous lesion identified.   Orbits: No orbital mass or acute orbital finding.   Sinuses: No significant paranasal sinus disease.   Soft tissues: Asymmetric soft tissue thickening and enhancement at the lateral aspect of the right external auditory canal.   Limited intracranial: No evidence of acute intracranial abnormality within the field of view. Partially empty sella turcica.   Other: The middle ear cavities appear normally aerated. No significant mastoid effusion. Streak and beam hardening artifact arising from dental restoration partially obscures the oral cavity.   IMPRESSION: Asymmetric soft tissue thickening and enhancement at the lateral aspect of the right external auditory canal, nonspecific but suspicious for acute otitis externa given the provided history. Direct  visualization is recommended.   Partially empty sella turcica. While this finding often reflects incidental anatomic variation, it can also be associated with idiopathic intracranial hypertension (pseudotumor cerebri).   MRI brain wo contrast (05/07/22): FINDINGS: Brain:   No age advanced or lobar predominant parenchymal atrophy.   Partially empty sella turcica.   No cortical encephalomalacia is identified. No significant cerebral white matter disease.   There is no acute infarct.   No evidence of an intracranial mass.   No chronic intracranial blood products.   No extra-axial fluid collection.   No midline shift.   Vascular: Maintained flow voids within the proximal large arterial vessels.   Skull and upper cervical spine: No focal suspicious marrow lesion.   Sinuses/Orbits: No mass or acute finding within the imaged orbits. No significant paranasal sinus disease.   IMPRESSION: 1. No evidence of an intracranial mass or acute infarct. 2. Partially empty sella turcica. This finding can reflect incidental anatomic variation, or alternatively, it can be associated with idiopathic intracranial hypertension (pseudotumor cerebri). 3. Otherwise unremarkable non-contrast MRI appearance of the brain.  Assessment/Plan:  This is Shirley Fleming, a 47 y.o. female with episodic migraine with aura - initial concern for positional component of HA so IIH was concerned. LP showed normal opening pressure. She also has significant neck pain that contributes  to headaches. Patient could not tolerate Topamax, so this was weaned off ~07/20/22. Patient currently on propranolol 10 mg daily. She has noticed taking extra propranolol has helped headaches in the past. She has taken Sumatriptan once for severe headache, but did not notice it helping.  Plan: Migraine prevention:  Take extra propranolol the week prior to menstrual cycle (takes 10 mg daily now, will increase to 10 mg twice daily the week  before and week of cycle) Migraine rescue:  Sumatriptan 100 mg as needed at headache onset and can repeat after 2 hours if needed Limit use of pain relievers to no more than 2 days out of week to prevent risk of rebound or medication-overuse headache. Keep headache diary Discussed PT for neck pain/tightness. Patient would like to defer for now. Follow up in 6 months or sooner if needed   Jacquelyne Balint, MD

## 2022-09-13 ENCOUNTER — Encounter: Payer: Self-pay | Admitting: Neurology

## 2022-09-13 ENCOUNTER — Ambulatory Visit (INDEPENDENT_AMBULATORY_CARE_PROVIDER_SITE_OTHER): Payer: BC Managed Care – PPO | Admitting: Neurology

## 2022-09-13 VITALS — BP 135/71 | HR 96 | Resp 16 | Ht 66.0 in | Wt 199.2 lb

## 2022-09-13 DIAGNOSIS — G43009 Migraine without aura, not intractable, without status migrainosus: Secondary | ICD-10-CM

## 2022-09-13 DIAGNOSIS — M542 Cervicalgia: Secondary | ICD-10-CM | POA: Diagnosis not present

## 2022-09-13 DIAGNOSIS — E559 Vitamin D deficiency, unspecified: Secondary | ICD-10-CM | POA: Diagnosis not present

## 2022-09-13 NOTE — Patient Instructions (Signed)
Migraine prevention:  Take extra propranolol the week prior to menstrual cycle (takes 10 mg daily now, will increase to 10 mg twice daily the week before and week of cycle) Migraine rescue:  Sumatriptan 100 mg as needed at headache onset and can repeat after 2 hours if needed Limit use of pain relievers to no more than 2 days out of week to prevent risk of rebound or medication-overuse headache. Keep headache diary Follow up in 6 months or sooner if needed  If you decide you want physical therapy for your neck and headaches change, please let me know.  The physicians and staff at [facility] are committed to providing excellent care. You may receive a survey requesting feedback about your experience at our office. We strive to receive "very good" responses to the survey questions. If you feel that your experience would prevent you from giving the office a "very good " response, please contact our office to try to remedy the situation. We may be reached at [phone]. Thank you for taking the time out of your busy day to complete the survey.  Jacquelyne Balint, MD [facility]

## 2022-09-19 DIAGNOSIS — F419 Anxiety disorder, unspecified: Secondary | ICD-10-CM | POA: Diagnosis not present

## 2022-09-19 DIAGNOSIS — F331 Major depressive disorder, recurrent, moderate: Secondary | ICD-10-CM | POA: Diagnosis not present

## 2022-10-06 DIAGNOSIS — F419 Anxiety disorder, unspecified: Secondary | ICD-10-CM | POA: Diagnosis not present

## 2022-10-06 DIAGNOSIS — F331 Major depressive disorder, recurrent, moderate: Secondary | ICD-10-CM | POA: Diagnosis not present

## 2022-10-15 ENCOUNTER — Encounter: Payer: Self-pay | Admitting: Physician Assistant

## 2022-10-18 ENCOUNTER — Encounter: Payer: Self-pay | Admitting: Physician Assistant

## 2022-10-18 ENCOUNTER — Other Ambulatory Visit (HOSPITAL_BASED_OUTPATIENT_CLINIC_OR_DEPARTMENT_OTHER): Payer: Self-pay

## 2022-10-18 MED ORDER — PROPRANOLOL HCL 10 MG PO TABS
10.0000 mg | ORAL_TABLET | Freq: Two times a day (BID) | ORAL | 0 refills | Status: DC | PRN
  Filled 2022-10-18: qty 60, 30d supply, fill #0

## 2022-10-20 ENCOUNTER — Other Ambulatory Visit (HOSPITAL_BASED_OUTPATIENT_CLINIC_OR_DEPARTMENT_OTHER): Payer: Self-pay

## 2022-10-20 ENCOUNTER — Other Ambulatory Visit: Payer: Self-pay

## 2022-10-20 DIAGNOSIS — Z3041 Encounter for surveillance of contraceptive pills: Secondary | ICD-10-CM

## 2022-10-20 MED ORDER — NORETHINDRONE 0.35 MG PO TABS
1.0000 | ORAL_TABLET | Freq: Every day | ORAL | 0 refills | Status: DC
  Filled 2022-10-20 – 2022-10-21 (×2): qty 84, 84d supply, fill #0
  Filled 2022-10-27: qty 28, 28d supply, fill #0
  Filled 2022-11-26: qty 28, 28d supply, fill #1

## 2022-10-20 NOTE — Telephone Encounter (Signed)
Medication refill request: norethindrone Last AEX:  08-05-21 Next AEX: was 10-25-22 with Dr. Edward Jolly, had to be rescheduled & appt is now 12-28-22 with Dr. Karma Greaser Last MMG (if hormonal medication request): breast MRI 05-03-22 Refill authorized: please approve if appropriate

## 2022-10-21 ENCOUNTER — Other Ambulatory Visit (HOSPITAL_BASED_OUTPATIENT_CLINIC_OR_DEPARTMENT_OTHER): Payer: Self-pay

## 2022-10-23 DIAGNOSIS — F331 Major depressive disorder, recurrent, moderate: Secondary | ICD-10-CM | POA: Diagnosis not present

## 2022-10-23 DIAGNOSIS — F419 Anxiety disorder, unspecified: Secondary | ICD-10-CM | POA: Diagnosis not present

## 2022-10-25 ENCOUNTER — Ambulatory Visit: Payer: BC Managed Care – PPO | Admitting: Obstetrics and Gynecology

## 2022-10-27 ENCOUNTER — Other Ambulatory Visit (HOSPITAL_BASED_OUTPATIENT_CLINIC_OR_DEPARTMENT_OTHER): Payer: Self-pay

## 2022-11-10 DIAGNOSIS — F419 Anxiety disorder, unspecified: Secondary | ICD-10-CM | POA: Diagnosis not present

## 2022-11-10 DIAGNOSIS — F331 Major depressive disorder, recurrent, moderate: Secondary | ICD-10-CM | POA: Diagnosis not present

## 2022-11-14 ENCOUNTER — Other Ambulatory Visit: Payer: Self-pay | Admitting: Physician Assistant

## 2022-11-14 DIAGNOSIS — Z1231 Encounter for screening mammogram for malignant neoplasm of breast: Secondary | ICD-10-CM

## 2022-11-20 NOTE — Progress Notes (Signed)
Shirley Fleming is a 47 y.o. female here for a {New prob or follow up:31724}.  History of Present Illness:   No chief complaint on file.   HPI  Mole: Pt reports her mole on her head has significantly changed recently.  She is unable to see dermatology in the next 3 months.  ***  Painful Bowel Movements: She also complains of painful bowel movements, starting ***.  ***    Past Medical History:  Diagnosis Date   Allergy    Anxiety    Asthma    Depression 1995   Off and on since teens   History of COVID-19 12/23/2020   Hypertension Jan 2021   Hypoglycemia    Migraine with aura      Social History   Tobacco Use   Smoking status: Former    Current packs/day: 0.00    Types: Cigarettes    Start date: 04/24/1990    Quit date: 04/24/2005    Years since quitting: 17.5    Passive exposure: Never   Smokeless tobacco: Never  Vaping Use   Vaping status: Never Used  Substance Use Topics   Alcohol use: Yes    Alcohol/week: 2.0 standard drinks of alcohol    Types: 2 Glasses of wine per week   Drug use: Never    Past Surgical History:  Procedure Laterality Date   APPENDECTOMY     TONSILLECTOMY AND ADENOIDECTOMY      Family History  Problem Relation Age of Onset   COPD Mother    Depression Mother    Heart attack Father 31   Hypertension Father    Heart disease Father    Breast cancer Maternal Grandmother        dx 30s, d. 30s   Cancer Maternal Grandmother    Heart attack Maternal Grandfather    Heart disease Maternal Grandfather    Breast cancer Paternal Grandmother        dx 72s; + vaginal cancer 60s, jaw cancer 66s   Cancer Paternal Grandmother    Heart attack Paternal Grandfather    ADD / ADHD Son    Other Son        hypermobile EDS   Breast cancer Maternal Aunt        dx mid 64s   Diabetes Maternal Aunt    Hypertension Maternal Aunt    Cancer Maternal Aunt    Depression Maternal Aunt    Hypertension Maternal Aunt     Allergies  Allergen Reactions    Doxycycline Nausea Only   Erythromycin Nausea And Vomiting    Current Medications:   Current Outpatient Medications:    albuterol (VENTOLIN HFA) 108 (90 Base) MCG/ACT inhaler, Inhale 1-2 puffs into the lungs every 6 (six) hours as needed., Disp: 6.7 g, Rfl: 2   ALPRAZolam (XANAX) 0.25 MG tablet, Take 1 tablet (0.25 mg total) by mouth every 8 (eight) hours as needed., Disp: 30 tablet, Rfl: 1   B Complex-Biotin-FA (B-COMPLEX PO), Take 0.5 tablets by mouth daily in the afternoon., Disp: , Rfl:    cholecalciferol (VITAMIN D3) 25 MCG (1000 UT) tablet, Take 1,000 Units by mouth daily., Disp: , Rfl:    ferrous sulfate 324 MG TBEC, Take 65 mg by mouth., Disp: , Rfl:    FLUoxetine (PROZAC) 20 MG capsule, Take 1 capsule (20 mg total) by mouth daily., Disp: 90 capsule, Rfl: 1   fluticasone (FLONASE) 50 MCG/ACT nasal spray, Place 1 spray into both nostrils daily., Disp: , Rfl:  magnesium 30 MG tablet, Take 30 mg by mouth daily., Disp: , Rfl:    Multiple Vitamin (MULTIVITAMIN) tablet, Take 1 tablet by mouth daily., Disp: , Rfl:    norethindrone (INCASSIA) 0.35 MG tablet, Take 1 tablet (0.35 mg total) by mouth daily., Disp: 84 tablet, Rfl: 0   propranolol (INDERAL) 10 MG tablet, Take 1 tablet (10 mg total) by mouth every 12 (twelve) hours as needed., Disp: 60 tablet, Rfl: 0   Saline (SIMPLY SALINE) 0.9 % AERS, Place 2 each into the nose as directed. Use nightly for sinus hygiene long-term.  Can also be used as many times daily as desired to assist with clearing congested sinuses., Disp: 127 mL, Rfl: 11   SUMAtriptan (IMITREX) 100 MG tablet, Take 1 tablet (100 mg total) by mouth once as needed for up to 1 dose for migraine. May repeat in 2 hours if headache persists or recurs., Disp: 10 tablet, Rfl: 11   Review of Systems:   ROS  Vitals:   There were no vitals filed for this visit.   There is no height or weight on file to calculate BMI.  Physical Exam:   Physical Exam  Assessment and Plan:    There are no diagnoses linked to this encounter.   I,Rachel Rivera,acting as a Neurosurgeon for [employer], PA.,have documented all relevant documentation on the behalf of Shirley Motto, PA,as directed by  Shirley Motto, PA while in the presence of Shirley Fleming, [place].  I, Isabelle Course, have reviewed all documentation for this visit. The documentation on 11/20/22 for the exam, diagnosis, procedures, and orders are all accurate and complete.  *** Shirley Motto, PA-C

## 2022-11-21 ENCOUNTER — Ambulatory Visit: Payer: BC Managed Care – PPO | Admitting: Physician Assistant

## 2022-11-21 VITALS — BP 120/80 | HR 68 | Temp 97.7°F | Ht 66.0 in | Wt 199.4 lb

## 2022-11-21 DIAGNOSIS — K6289 Other specified diseases of anus and rectum: Secondary | ICD-10-CM | POA: Diagnosis not present

## 2022-11-21 DIAGNOSIS — Z1211 Encounter for screening for malignant neoplasm of colon: Secondary | ICD-10-CM | POA: Diagnosis not present

## 2022-11-21 DIAGNOSIS — D229 Melanocytic nevi, unspecified: Secondary | ICD-10-CM | POA: Diagnosis not present

## 2022-11-21 NOTE — Patient Instructions (Signed)
It was great to see you!  Apply zinc oxide (such as desitin) to the area to help calm down inflammation  Referral to gastroenterology placed today  Follow-up with dermatology regarding your like seborrheic keratosis  Take care,  Jarold Motto PA-C

## 2022-11-23 ENCOUNTER — Ambulatory Visit
Admission: RE | Admit: 2022-11-23 | Discharge: 2022-11-23 | Disposition: A | Discharge status: Home / Self Care | Payer: BC Managed Care – PPO | Source: Ambulatory Visit | Attending: Physician Assistant | Admitting: Physician Assistant

## 2022-11-23 DIAGNOSIS — F419 Anxiety disorder, unspecified: Secondary | ICD-10-CM | POA: Diagnosis not present

## 2022-11-23 DIAGNOSIS — Z1231 Encounter for screening mammogram for malignant neoplasm of breast: Secondary | ICD-10-CM | POA: Diagnosis not present

## 2022-11-23 DIAGNOSIS — F331 Major depressive disorder, recurrent, moderate: Secondary | ICD-10-CM | POA: Diagnosis not present

## 2022-12-05 ENCOUNTER — Other Ambulatory Visit: Payer: Self-pay | Admitting: Physician Assistant

## 2022-12-05 ENCOUNTER — Other Ambulatory Visit (HOSPITAL_BASED_OUTPATIENT_CLINIC_OR_DEPARTMENT_OTHER): Payer: Self-pay

## 2022-12-05 DIAGNOSIS — F419 Anxiety disorder, unspecified: Secondary | ICD-10-CM | POA: Diagnosis not present

## 2022-12-05 DIAGNOSIS — F331 Major depressive disorder, recurrent, moderate: Secondary | ICD-10-CM | POA: Diagnosis not present

## 2022-12-05 MED ORDER — PROPRANOLOL HCL 10 MG PO TABS
10.0000 mg | ORAL_TABLET | Freq: Two times a day (BID) | ORAL | 1 refills | Status: DC | PRN
  Filled 2022-12-05: qty 60, 30d supply, fill #0
  Filled 2023-01-10: qty 60, 30d supply, fill #1

## 2022-12-06 ENCOUNTER — Encounter: Payer: Self-pay | Admitting: Gastroenterology

## 2022-12-07 ENCOUNTER — Encounter (INDEPENDENT_AMBULATORY_CARE_PROVIDER_SITE_OTHER): Payer: Self-pay

## 2022-12-19 DIAGNOSIS — F331 Major depressive disorder, recurrent, moderate: Secondary | ICD-10-CM | POA: Diagnosis not present

## 2022-12-19 DIAGNOSIS — F419 Anxiety disorder, unspecified: Secondary | ICD-10-CM | POA: Diagnosis not present

## 2022-12-28 ENCOUNTER — Other Ambulatory Visit (HOSPITAL_BASED_OUTPATIENT_CLINIC_OR_DEPARTMENT_OTHER): Payer: Self-pay

## 2022-12-28 ENCOUNTER — Ambulatory Visit (INDEPENDENT_AMBULATORY_CARE_PROVIDER_SITE_OTHER): Payer: BC Managed Care – PPO | Admitting: Obstetrics and Gynecology

## 2022-12-28 ENCOUNTER — Encounter: Payer: Self-pay | Admitting: Obstetrics and Gynecology

## 2022-12-28 ENCOUNTER — Other Ambulatory Visit (HOSPITAL_COMMUNITY)
Admission: RE | Admit: 2022-12-28 | Discharge: 2022-12-28 | Disposition: A | Discharge status: Home / Self Care | Payer: BC Managed Care – PPO | Source: Ambulatory Visit | Attending: Obstetrics and Gynecology | Admitting: Obstetrics and Gynecology

## 2022-12-28 VITALS — BP 112/68 | HR 72 | Ht 64.75 in | Wt 200.0 lb

## 2022-12-28 DIAGNOSIS — Z01419 Encounter for gynecological examination (general) (routine) without abnormal findings: Secondary | ICD-10-CM

## 2022-12-28 DIAGNOSIS — Z3041 Encounter for surveillance of contraceptive pills: Secondary | ICD-10-CM | POA: Insufficient documentation

## 2022-12-28 MED ORDER — NORETHINDRONE 0.35 MG PO TABS
1.0000 | ORAL_TABLET | Freq: Every day | ORAL | 3 refills | Status: DC
  Filled 2022-12-28: qty 28, 28d supply, fill #0
  Filled 2023-01-27 (×2): qty 28, 28d supply, fill #1
  Filled 2023-02-23: qty 28, 28d supply, fill #2

## 2022-12-28 NOTE — Progress Notes (Signed)
47 y.o. y.o. female here for annual exam. Patient with debilitating periods. She is bleeding for 7-10 days with multiple clots and pain.  She cannot predict when she will have a period and has to carry supplies with her.  She is changing her pad and tampon Q27min to 1 hour when she has her periods. She cannot take any estrogen birth control due to her migraines. She is taking the progesterone only birth control and this has not helped as well. Last TV US with 10cm uterus with likely adenomyosis. She does not desire any more children and her husband has had the vasectomy.    Blood pressure 112/68, pulse 72, height 5' 4.75" (1.645 m), weight 200 lb (90.7 kg), last menstrual period 12/26/2022, SpO2 98%.     Component Value Date/Time   DIAGPAP  07/08/2019 1509    - Negative for intraepithelial lesion or malignancy (NILM)   HPVHIGH Negative 07/08/2019 1509   ADEQPAP  07/08/2019 1509    Satisfactory for evaluation; transformation zone component PRESENT.    GYN HISTORY:    Component Value Date/Time   DIAGPAP  07/08/2019 1509    - Negative for intraepithelial lesion or malignancy (NILM)   HPVHIGH Negative 07/08/2019 1509   ADEQPAP  07/08/2019 1509    Satisfactory for evaluation; transformation zone component PRESENT.    OB History  Gravida Para Term Preterm AB Living  2 2 0 0 0 2  SAB IAB Ectopic Multiple Live Births  0 0 0 0 0    # Outcome Date GA Lbr Len/2nd Weight Sex Type Anes PTL Lv  2 Para      Vag-Spont     1 Para      Vag-Spont       Past Medical History:  Diagnosis Date   Allergy    Anxiety    Asthma    Depression 1995   Off and on since teens   History of COVID-19 12/23/2020   Hypertension Jan 2021   Hypoglycemia    Migraine with aura     Past Surgical History:  Procedure Laterality Date   APPENDECTOMY     TONSILLECTOMY AND ADENOIDECTOMY      Current Outpatient Medications on File Prior to Visit  Medication Sig Dispense Refill   ALPRAZolam (XANAX)  0.25 MG tablet Take 1 tablet (0.25 mg total) by mouth every 8 (eight) hours as needed. 30 tablet 1   B Complex-Biotin-FA (B-COMPLEX PO) Take 0.5 tablets by mouth daily in the afternoon.     cholecalciferol (VITAMIN D3) 25 MCG (1000 UT) tablet Take 1,000 Units by mouth daily.     ferrous sulfate 324 MG TBEC Take 65 mg by mouth.     FLUoxetine (PROZAC) 20 MG capsule Take 1 capsule (20 mg total) by mouth daily. 90 capsule 1   fluticasone (FLONASE) 50 MCG/ACT nasal spray Place 1 spray into both nostrils daily.     magnesium 30 MG tablet Take 30 mg by mouth daily.     Multiple Vitamin (MULTIVITAMIN) tablet Take 1 tablet by mouth daily.     norethindrone (INCASSIA) 0.35 MG tablet Take 1 tablet (0.35 mg total) by mouth daily. 84 tablet 0   propranolol (INDERAL) 10 MG tablet Take 1 tablet (10 mg total) by mouth every 12 (twelve) hours as needed. 60 tablet 1   Saline (SIMPLY SALINE) 0.9 % AERS Place 2 each into the nose as directed. Use nightly for sinus hygiene long-term.  Can also be used as many  times daily as desired to assist with clearing congested sinuses. 127 mL 11   SUMAtriptan (IMITREX) 100 MG tablet Take 1 tablet (100 mg total) by mouth once as needed for up to 1 dose for migraine. May repeat in 2 hours if headache persists or recurs. 10 tablet 11   albuterol (VENTOLIN HFA) 108 (90 Base) MCG/ACT inhaler Inhale 1-2 puffs into the lungs every 6 (six) hours as needed. (Patient not taking: Reported on 12/28/2022) 6.7 g 2   No current facility-administered medications on file prior to visit.    Social History   Socioeconomic History   Marital status: Married    Spouse name: Not on file   Number of children: Not on file   Years of education: Not on file   Highest education level: Associate degree: occupational, Scientist, product/process development, or vocational program  Occupational History   Not on file  Tobacco Use   Smoking status: Former    Current packs/day: 0.00    Types: Cigarettes    Start date: 04/24/1990     Quit date: 04/24/2005    Years since quitting: 17.6    Passive exposure: Never   Smokeless tobacco: Never  Vaping Use   Vaping status: Never Used  Substance and Sexual Activity   Alcohol use: Yes    Alcohol/week: 2.0 standard drinks of alcohol    Types: 2 Glasses of wine per week   Drug use: Never   Sexual activity: Yes    Birth control/protection: Pill, Other-see comments    Comment: Husband had vasectomy  Other Topics Concern   Not on file  Social History Narrative   Married   Two children    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 Right   Are you currently employed ? yes   What is your current occupation? home   Do you live at home alone? Husband and kids and Dad   Who lives with you?    What type of home do you live in: 1 story or 2 story? two    Caffeine 2-3 day   Social Determinants of Health   Financial Resource Strain: Low Risk  (11/21/2022)   Overall Financial Resource Strain (CARDIA)    Difficulty of Paying Living Expenses: Not hard at all  Food Insecurity: No Food Insecurity (11/21/2022)   Hunger Vital Sign    Worried About Running Out of Food in the Last Year: Never true    Ran Out of Food in the Last Year: Never true  Transportation Needs: No Transportation Needs (11/21/2022)   PRAPARE - Administrator, Civil Service (Medical): No    Lack of Transportation (Non-Medical): No  Physical Activity: Sufficiently Active (11/21/2022)   Exercise Vital Sign    Days of Exercise per Week: 5 days    Minutes of Exercise per Session: 30 min  Stress: Stress Concern Present (11/21/2022)   [employer] of Occupational Health - Occupational Stress Questionnaire    Feeling of Stress : To some extent  Social Connections: Moderately Isolated (11/21/2022)   Social Connection and Isolation Panel [NHANES]    Frequency of Communication with Friends and Family: Three times a week    Frequency of  Social Gatherings with Friends and Family: More than three times a week    Attends Religious Services: Never    Database administrator or Organizations: No    Attends Banker Meetings:  Not on file    Marital Status: Married  Catering manager Violence: Not on file    Family History  Problem Relation Age of Onset   COPD Mother    Depression Mother    Heart attack Father 4   Hypertension Father    Heart disease Father    Hypothyroidism Brother    Breast cancer Maternal Aunt        dx mid 51s   Diabetes Maternal Aunt    Hypertension Maternal Aunt    Cancer Maternal Aunt    Depression Maternal Aunt    Hypertension Maternal Aunt    Breast cancer Maternal Grandmother        dx 30s, d. 30s   Cancer Maternal Grandmother    Heart attack Maternal Grandfather    Heart disease Maternal Grandfather    Breast cancer Paternal Grandmother        dx 53s; + vaginal cancer 60s, jaw cancer 108s   Cancer Paternal Grandmother    Heart attack Paternal Grandfather    ADD / ADHD Son    Other Son        hypermobile EDS     Allergies  Allergen Reactions   Doxycycline Nausea Only   Erythromycin Nausea And Vomiting      Patient's last menstrual period was Patient's last menstrual period was 12/26/2022 (exact date)..            Review of Systems Alls systems reviewed and are negative.     PE General appearance: alert, cooperative and appears stated age Head: Normocephalic, without obvious abnormality, atraumatic Neck: no adenopathy, supple, symmetrical, trachea midline and thyroid normal to inspection and palpation Lungs: clear to auscultation bilaterally Breasts: normal appearance, no masses or tenderness Heart: regular rate and rhythm Abdomen: soft, non-tender; bowel sounds normal; no masses,  no organomegaly Extremities: extremities normal, atraumatic, no cyanosis or edema Skin: Skin color, texture, turgor normal. No rashes or lesions Lymph nodes: Cervical,  supraclavicular, and axillary nodes normal. No abnormal inguinal nodes palpated Neurologic: Grossly normal     Pelvic: External genitalia:  no lesions              Urethra:  normal appearing urethra with no masses, tenderness or lesions              Bartholins and Skenes: normal                 Vagina: normal appearing vagina with normal color and discharge, no lesions.               Cervix: no lesions, no cervical motion tenderness               Bimanual Exam:  Uterus:  increase size, boggy contour, tender to light palpation              Adnexa: no mass, fullness, tenderness          Chaperone was present for exam.   A:         Well Woman GYN exam, DUB, menorrhagia, dysmenorrhea, adenomyosis                             P:        Pap smear collected             Encouraged annual mammogram screening             Colonoscopy - patient is planning on  scheduling             Labs and immunizations with her primary             Discussed breast self exams             Encouraged safe sexual practices and enouraged healthy lifestyle practices with diet and exercise Counseled on adenomyosis: diagnosis, treatment and options with [facility] since hormonal treatment has failed.  Discussed in detail the [facility] and the r/b/a/I of the procedure.  She will consider her options. Earley Favor

## 2023-01-01 ENCOUNTER — Encounter: Payer: Self-pay | Admitting: Obstetrics and Gynecology

## 2023-01-01 DIAGNOSIS — N8003 Adenomyosis of the uterus: Secondary | ICD-10-CM

## 2023-01-01 DIAGNOSIS — N946 Dysmenorrhea, unspecified: Secondary | ICD-10-CM

## 2023-01-01 DIAGNOSIS — N921 Excessive and frequent menstruation with irregular cycle: Secondary | ICD-10-CM

## 2023-01-01 NOTE — Telephone Encounter (Signed)
Routing to Dr. Karma Greaser.

## 2023-01-01 NOTE — Progress Notes (Signed)
Subjective:    Shirley Fleming is a 47 y.o. female and is here for a comprehensive physical exam.  HPI  Health Maintenance Due  Topic Date Due   Colonoscopy  Never done   INFLUENZA VACCINE  11/23/2022   COVID-19 Vaccine (4 - 2023-24 season) 12/24/2022    Acute Concerns: Menstrual Symptoms She states that she's considering having a hysterectomy as she's having heavy periods for about 10 days.  She's also experiencing fatigue, being bloated, and cramps.   Sinus Pain  She's been experiencing sinus inflammation for the past few months. She states that she typically feels this around spring and fall seasons.  Denies any ear congestion.   Chronic Issues: Anxiety and depression:   She reports compliance and good tolerance of Prozac 20 mg daily She's been experiencing stress due to her brother's health.    Migraines  She started following with a neurologist to manage her headaches.   She states that Imitrex did not help relieve her symptoms.  She was seeing improvements with Topamax  but was experiencing side effects.   Health Maintenance: Immunizations -- n/a Colonoscopy -- n/a -Mammogram -- last done on 11-08-21. Normal exam. PAP -- Last done on 07-07-18. Normal exam. Bone Density -- n/a Diet -- she typically follows a healthy diet minimal snacking and sugar intake.  Exercise -- walk 2-3 miles a day  Sleep habits -- no complains Mood -- stable   UTD with dentist? - yes UTD with eye doctor? - yes  Weight history: Wt Readings from Last 10 Encounters:  12/28/22 200 lb (90.7 kg)  11/21/22 199 lb 6.1 oz (90.4 kg)  09/13/22 199 lb 3.2 oz (90.4 kg)  07/25/22 198 lb (89.8 kg)  07/05/22 194 lb 12.8 oz (88.4 kg)  06/07/22 198 lb (89.8 kg)  04/05/22 192 lb 12.8 oz (87.5 kg)  02/28/22 194 lb (88 kg)  02/13/22 194 lb (88 kg)  01/27/22 194 lb 8 oz (88.2 kg)   There is no height or weight on file to calculate BMI. Patient's last menstrual period was 12/26/2022 (exact  date).  Alcohol use:  reports current alcohol use of about 2.0 standard drinks of alcohol per week.  Tobacco use:  Tobacco Use: Medium Risk (12/28/2022)   Patient History    Smoking Tobacco Use: Former    Smokeless Tobacco Use: Never    Passive Exposure: Never   Eligible for lung cancer screening? no     11/21/2022    9:46 AM  Depression screen PHQ 2/9  Decreased Interest 0  Down, Depressed, Hopeless 1  PHQ - 2 Score 1  Altered sleeping 1  Tired, decreased energy 1  Change in appetite 0  Feeling bad or failure about yourself  0  Trouble concentrating 1  Moving slowly or fidgety/restless 1  Suicidal thoughts 0  PHQ-9 Score 5  Difficult doing work/chores Somewhat difficult     Other providers/specialists: Patient Care Team: Jarold Motto, [place] as PCP - General (Physician Assistant) Antony Madura, MD as Consulting Physician (Neurology) Earley Favor, MD as Consulting Physician (Obstetrics and Gynecology)    PMHx, SurgHx, SocialHx, Medications, and Allergies were reviewed in the Visit Navigator and updated as appropriate.   Past Medical History:  Diagnosis Date   Allergy    Anxiety    Asthma    Depression 1995   Off and on since teens   History of COVID-19 12/23/2020   Hypertension Jan 2021   Hypoglycemia    Migraine with aura  Past Surgical History:  Procedure Laterality Date   APPENDECTOMY     TONSILLECTOMY AND ADENOIDECTOMY       Family History  Problem Relation Age of Onset   COPD Mother    Depression Mother    Heart attack Father 37   Hypertension Father    Heart disease Father    Hypothyroidism Brother    Breast cancer Maternal Aunt        dx mid 75s   Diabetes Maternal Aunt    Hypertension Maternal Aunt    Cancer Maternal Aunt    Depression Maternal Aunt    Hypertension Maternal Aunt    Breast cancer Maternal Grandmother        dx 30s, d. 30s   Cancer Maternal Grandmother    Heart attack Maternal Grandfather    Heart  disease Maternal Grandfather    Breast cancer Paternal Grandmother        dx 42s; + vaginal cancer 62s, jaw cancer 62s   Cancer Paternal Grandmother    Heart attack Paternal Grandfather    ADD / ADHD Son    Other Son        hypermobile EDS    Social History   Tobacco Use   Smoking status: Former    Current packs/day: 0.00    Types: Cigarettes    Start date: 04/24/1990    Quit date: 04/24/2005    Years since quitting: 17.7    Passive exposure: Never   Smokeless tobacco: Never  Vaping Use   Vaping status: Never Used  Substance Use Topics   Alcohol use: Yes    Alcohol/week: 2.0 standard drinks of alcohol    Types: 2 Glasses of wine per week   Drug use: Never    Review of Systems:   Review of Systems  Constitutional:  Positive for malaise/fatigue.  HENT:  Positive for sinus pain. Negative for ear pain.   Gastrointestinal:  Positive for abdominal pain.    Objective:   LMP 12/26/2022 (Exact Date)  There is no height or weight on file to calculate BMI.   General Appearance:    Alert, cooperative, no distress, appears stated age  Head:    Normocephalic, without obvious abnormality, atraumatic  Eyes:    PERRL, conjunctiva/corneas clear, EOM's intact, fundi    benign, both eyes  Ears:    Normal TM's and external ear canals, both ears  Nose:   Nares normal, septum midline, mucosa normal, no drainage    or sinus tenderness  Throat:   Lips, mucosa, and tongue normal; teeth and gums normal  Neck:   Supple, symmetrical, trachea midline, no adenopathy;    thyroid:  no enlargement/tenderness/nodules; no carotid   bruit or JVD  Back:     Symmetric, no curvature, ROM normal, no CVA tenderness  Lungs:     Clear to auscultation bilaterally, respirations unlabored  Chest Wall:    No tenderness or deformity   Heart:    Regular rate and rhythm, S1 and S2 normal, no murmur, rub or gallop  Breast Exam:    Deferred  Abdomen:     Soft, non-tender, bowel sounds active all four quadrants,     no masses, no organomegaly  Genitalia:    Deferred  Extremities:   Extremities normal, atraumatic, no cyanosis or edema  Pulses:   2+ and symmetric all extremities  Skin:   Skin color, texture, turgor normal, no rashes or lesions  Lymph nodes:   Cervical, supraclavicular, and axillary nodes normal  Neurologic:   CNII-XII intact, normal strength, sensation and reflexes    throughout    Assessment/Plan:   Routine physical examination Today patient counseled on age appropriate routine health concerns for screening and prevention, each reviewed and up to date or declined. Immunizations reviewed and up to date or declined. Labs ordered and reviewed. Risk factors for depression reviewed and negative. Hearing function and visual acuity are intact. ADLs screened and addressed as needed. Functional ability and level of safety reviewed and appropriate. Education, counseling and referrals performed based on assessed risks today. Patient provided with a copy of personalized plan for preventive services.  Need for immunization against influenza Flu shot given today  Anxiety; Depression, unspecified depression type Overall controlled Continue Prozac 20 mg daily Follow-up in 1 year, sooner if concerns  Obesity, unspecified classification, unspecified obesity type, unspecified whether serious comorbidity present Discussed GLP-1's - -she will think about this Continue healthy lifestyle efforts  Sinus congestion No red flags on exam.  Will initiate Augmentin and prednisone per orders. Discussed taking medications as prescribed. Reviewed return precautions including worsening fever, SOB, worsening cough or other concerns. Push fluids and rest. I recommend that patient follow-up if symptoms worsen or persist despite treatment x 7-10 days, sooner if needed.   Jarold Motto, PA-C [facility]  I,Safa M Kadhim,acting as a Neurosurgeon for [employer], PA.,have documented all relevant  documentation on the behalf of Jarold Motto, PA,as directed by  Jarold Motto, PA while in the presence of Jarold Motto, [place].   I, Jarold Motto, [place], have reviewed all documentation for this visit. The documentation on 01/01/23 for the exam, diagnosis, procedures, and orders are all accurate and complete.

## 2023-01-02 ENCOUNTER — Other Ambulatory Visit (HOSPITAL_BASED_OUTPATIENT_CLINIC_OR_DEPARTMENT_OTHER): Payer: Self-pay

## 2023-01-02 ENCOUNTER — Ambulatory Visit (INDEPENDENT_AMBULATORY_CARE_PROVIDER_SITE_OTHER): Payer: BC Managed Care – PPO | Admitting: Physician Assistant

## 2023-01-02 ENCOUNTER — Encounter: Payer: Self-pay | Admitting: Physician Assistant

## 2023-01-02 VITALS — BP 110/80 | HR 64 | Temp 97.5°F | Ht 64.75 in | Wt 202.0 lb

## 2023-01-02 DIAGNOSIS — E669 Obesity, unspecified: Secondary | ICD-10-CM | POA: Diagnosis not present

## 2023-01-02 DIAGNOSIS — F419 Anxiety disorder, unspecified: Secondary | ICD-10-CM | POA: Diagnosis not present

## 2023-01-02 DIAGNOSIS — Z23 Encounter for immunization: Secondary | ICD-10-CM | POA: Diagnosis not present

## 2023-01-02 DIAGNOSIS — F32A Depression, unspecified: Secondary | ICD-10-CM

## 2023-01-02 DIAGNOSIS — Z0001 Encounter for general adult medical examination with abnormal findings: Secondary | ICD-10-CM

## 2023-01-02 DIAGNOSIS — R0981 Nasal congestion: Secondary | ICD-10-CM

## 2023-01-02 DIAGNOSIS — Z0000 Encounter for general adult medical examination without abnormal findings: Secondary | ICD-10-CM

## 2023-01-02 LAB — COMPREHENSIVE METABOLIC PANEL
ALT: 19 U/L (ref 0–35)
AST: 21 U/L (ref 0–37)
Albumin: 4.1 g/dL (ref 3.5–5.2)
Alkaline Phosphatase: 62 U/L (ref 39–117)
BUN: 10 mg/dL (ref 6–23)
CO2: 30 meq/L (ref 19–32)
Calcium: 9.7 mg/dL (ref 8.4–10.5)
Chloride: 101 meq/L (ref 96–112)
Creatinine, Ser: 0.69 mg/dL (ref 0.40–1.20)
GFR: 103.65 mL/min (ref >60.00)
Glucose, Bld: 85 mg/dL (ref 70–99)
Potassium: 3.9 meq/L (ref 3.5–5.1)
Sodium: 139 meq/L (ref 135–145)
Total Bilirubin: 0.6 mg/dL (ref 0.2–1.2)
Total Protein: 7.1 g/dL (ref 6.0–8.3)

## 2023-01-02 LAB — CBC WITH DIFFERENTIAL/PLATELET
Basophils Absolute: 0.1 10*3/uL (ref 0.0–0.1)
Basophils Relative: 0.7 % (ref 0.0–3.0)
Eosinophils Absolute: 0.5 10*3/uL (ref 0.0–0.7)
Eosinophils Relative: 7 % — ABNORMAL HIGH (ref 0.0–5.0)
HCT: 43.6 % (ref 36.0–46.0)
Hemoglobin: 14.3 g/dL (ref 12.0–15.0)
Lymphocytes Relative: 16.5 % (ref 12.0–46.0)
Lymphs Abs: 1.3 10*3/uL (ref 0.7–4.0)
MCHC: 32.8 g/dL (ref 30.0–36.0)
MCV: 91 fl (ref 78.0–100.0)
Monocytes Absolute: 0.6 10*3/uL (ref 0.1–1.0)
Monocytes Relative: 7.9 % (ref 3.0–12.0)
Neutro Abs: 5.2 10*3/uL (ref 1.4–7.7)
Neutrophils Relative %: 67.9 % (ref 43.0–77.0)
Platelets: 196 10*3/uL (ref 150.0–400.0)
RBC: 4.79 Mil/uL (ref 3.87–5.11)
RDW: 13.4 % (ref 11.5–15.5)
WBC: 7.7 10*3/uL (ref 4.0–10.5)

## 2023-01-02 LAB — LIPID PANEL
Cholesterol: 189 mg/dL (ref 0–200)
HDL: 52 mg/dL (ref >39.00)
LDL Cholesterol: 110 mg/dL — ABNORMAL HIGH (ref 0–99)
NonHDL: 136.54
Total CHOL/HDL Ratio: 4
Triglycerides: 135 mg/dL (ref 0.0–149.0)
VLDL: 27 mg/dL (ref 0.0–40.0)

## 2023-01-02 MED ORDER — PREDNISONE 20 MG PO TABS
20.0000 mg | ORAL_TABLET | Freq: Two times a day (BID) | ORAL | 0 refills | Status: DC
  Filled 2023-01-02: qty 10, 5d supply, fill #0

## 2023-01-02 MED ORDER — AMOXICILLIN-POT CLAVULANATE 875-125 MG PO TABS
1.0000 | ORAL_TABLET | Freq: Two times a day (BID) | ORAL | 0 refills | Status: DC
  Filled 2023-01-02: qty 14, 7d supply, fill #0

## 2023-01-02 NOTE — Patient Instructions (Signed)
It was great to see you! ? ?Please go to the lab for blood work.  ? ?Our office will call you with your results unless you have chosen to receive results via MyChart. ? ?If your blood work is normal we will follow-up each year for physicals and as scheduled for chronic medical problems. ? ?If anything is abnormal we will treat accordingly and get you in for a follow-up. ? ?Take care, ? ?Samantha ?  ? ? ?

## 2023-01-03 DIAGNOSIS — F419 Anxiety disorder, unspecified: Secondary | ICD-10-CM | POA: Diagnosis not present

## 2023-01-03 DIAGNOSIS — F331 Major depressive disorder, recurrent, moderate: Secondary | ICD-10-CM | POA: Diagnosis not present

## 2023-01-12 LAB — CYTOLOGY - PAP: Diagnosis: NEGATIVE

## 2023-01-16 DIAGNOSIS — F419 Anxiety disorder, unspecified: Secondary | ICD-10-CM | POA: Diagnosis not present

## 2023-01-16 DIAGNOSIS — F331 Major depressive disorder, recurrent, moderate: Secondary | ICD-10-CM | POA: Diagnosis not present

## 2023-01-19 ENCOUNTER — Ambulatory Visit (AMBULATORY_SURGERY_CENTER): Payer: BC Managed Care – PPO

## 2023-01-19 ENCOUNTER — Other Ambulatory Visit (HOSPITAL_BASED_OUTPATIENT_CLINIC_OR_DEPARTMENT_OTHER): Payer: Self-pay

## 2023-01-19 VITALS — Ht 65.0 in | Wt 195.0 lb

## 2023-01-19 DIAGNOSIS — Z1211 Encounter for screening for malignant neoplasm of colon: Secondary | ICD-10-CM

## 2023-01-19 MED ORDER — NA SULFATE-K SULFATE-MG SULF 17.5-3.13-1.6 GM/177ML PO SOLN
1.0000 | Freq: Once | ORAL | 0 refills | Status: AC
  Filled 2023-01-19: qty 354, 1d supply, fill #0

## 2023-01-19 NOTE — Progress Notes (Signed)
Pre visit completed via phone call; Patient verified name, DOB, and address; No egg or soy allergy known to patient; No issues known to pt with past sedation with any surgeries or procedures; Patient denies ever being told they had issues or difficulty with intubation;  No FH of Malignant Hyperthermia; Pt is not on diet pills; Pt is not on home 02;  Pt is not on blood thinners;  Pt reports issues with constipation - takes fiber gummies, increase oral fluids, fruits/veggies, increases activity already- advised she can take stool softener or laxative if needed; No A fib or A flutter; Have any cardiac testing pending--NO Insurance verified during PV appt--- BCBS Pt can ambulate without assistance;  Pt denies use of chewing tobacco; Discussed diabetic/weight loss medication holds; Discussed NSAID holds; Checked BMI to be less than 50; Pt instructed to use Singlecare.com or GoodRx for a price reduction on prep;  Patient's chart reviewed by Cathlyn Parsons CNRA prior to previsit and patient appropriate for the [facility].  Pre visit completed and red dot placed by patient's name on their procedure day (on provider's schedule).    Instructions sent to MyChart per patient request as well as printed and mailed to the patient per her request;

## 2023-01-23 ENCOUNTER — Encounter: Payer: Self-pay | Admitting: Gastroenterology

## 2023-01-27 ENCOUNTER — Other Ambulatory Visit (HOSPITAL_BASED_OUTPATIENT_CLINIC_OR_DEPARTMENT_OTHER): Payer: Self-pay

## 2023-01-30 DIAGNOSIS — F419 Anxiety disorder, unspecified: Secondary | ICD-10-CM | POA: Diagnosis not present

## 2023-01-30 DIAGNOSIS — F331 Major depressive disorder, recurrent, moderate: Secondary | ICD-10-CM | POA: Diagnosis not present

## 2023-02-09 ENCOUNTER — Encounter: Payer: Self-pay | Admitting: Gastroenterology

## 2023-02-09 ENCOUNTER — Ambulatory Visit (AMBULATORY_SURGERY_CENTER): Payer: BC Managed Care – PPO | Admitting: Gastroenterology

## 2023-02-09 VITALS — BP 119/80 | HR 58 | Temp 97.2°F | Resp 12 | Ht 65.0 in | Wt 195.0 lb

## 2023-02-09 DIAGNOSIS — K635 Polyp of colon: Secondary | ICD-10-CM

## 2023-02-09 DIAGNOSIS — Z1211 Encounter for screening for malignant neoplasm of colon: Secondary | ICD-10-CM | POA: Diagnosis not present

## 2023-02-09 DIAGNOSIS — D125 Benign neoplasm of sigmoid colon: Secondary | ICD-10-CM

## 2023-02-09 HISTORY — PX: COLONOSCOPY: SHX174

## 2023-02-09 MED ORDER — SODIUM CHLORIDE 0.9 % IV SOLN: 500.0000 mL | Freq: Once | INTRAVENOUS | Status: DC

## 2023-02-09 NOTE — Progress Notes (Signed)
Sedate, gd SR, tolerated procedure well, VSS, report to RN 

## 2023-02-09 NOTE — Patient Instructions (Signed)
Read all of the handouts given to you by your recovery room nurse.  The banding sheet is there too.  Resume all of your previous medications today as ordered.  YOU HAD AN ENDOSCOPIC PROCEDURE TODAY AT THE [facility]:   Refer to the procedure report that was given to you for any specific questions about what was found during the examination.  If the procedure report does not answer your questions, please call your gastroenterologist to clarify.  If you requested that your care partner not be given the details of your procedure findings, then the procedure report has been included in a sealed envelope for you to review at your convenience later.  YOU SHOULD EXPECT: Some feelings of bloating in the abdomen. Passage of more gas than usual.  Walking can help get rid of the air that was put into your GI tract during the procedure and reduce the bloating. If you had a lower endoscopy (such as a colonoscopy or flexible sigmoidoscopy) you may notice spotting of blood in your stool or on the toilet paper. If you underwent a bowel prep for your procedure, you may not have a normal bowel movement for a few days.  Please Note:  You might notice some irritation and congestion in your nose or some drainage.  This is from the oxygen used during your procedure.  There is no need for concern and it should clear up in a day or so.  SYMPTOMS TO REPORT IMMEDIATELY:  Following lower endoscopy (colonoscopy or flexible sigmoidoscopy):  Excessive amounts of blood in the stool  Significant tenderness or worsening of abdominal pains  Swelling of the abdomen that is new, acute  Fever of 100F or higher   For urgent or emergent issues, a gastroenterologist can be reached at any hour by calling [phone]. Do not use MyChart messaging for urgent concerns.    DIET:  We do recommend a small meal at first, but then you may proceed to your regular diet.  Drink plenty of fluids but you should avoid alcoholic  beverages for 24 hours.  ACTIVITY:  You should plan to take it easy for the rest of today and you should NOT DRIVE or use heavy machinery until tomorrow (because of the sedation medicines used during the test).    FOLLOW UP: Our staff will call the number listed on your records the next business day following your procedure.  We will call around 7:15- 8:00 am to check on you and address any questions or concerns that you may have regarding the information given to you following your procedure. If we do not reach you, we will leave a message.     If any biopsies were taken you will be contacted by phone or by letter within the next 1-3 weeks.  Please call us at [phone] if you have not heard about the biopsies in 3 weeks.    SIGNATURES/CONFIDENTIALITY: You and/or your care partner have signed paperwork which will be entered into your electronic medical record.  These signatures attest to the fact that that the information above on your After Visit Summary has been reviewed and is understood.  Full responsibility of the confidentiality of this discharge information lies with you and/or your care-partner.

## 2023-02-09 NOTE — Progress Notes (Signed)
[facility] History and Physical   Primary Care Physician:  Jarold Motto, [place]   Reason for Procedure:  Colorectal cancer screening  Plan:    Screening colonoscopy with possible interventions as needed     HPI: Shirley Fleming is a very pleasant 47 y.o. female here for screening colonoscopy. Denies any nausea, vomiting, abdominal pain, melena or bright red blood per rectum  The risks and benefits as well as alternatives of endoscopic procedure(s) have been discussed and reviewed. All questions answered. The patient agrees to proceed.    Past Medical History:  Diagnosis Date   Anemia    takes iron- heavy menstral cycle   Anxiety    on meds   Asthma    uses inhaler with exercise;   Depression 1995   Off and on since teens   GERD (gastroesophageal reflux disease)    OTC PRN meds   History of COVID-19 12/23/2020   Hypertension Jan 2021   Hypoglycemia    Migraine with aura    Seasonal allergies     Past Surgical History:  Procedure Laterality Date   APPENDECTOMY     TONSILLECTOMY AND ADENOIDECTOMY      Prior to Admission medications   Medication Sig Start Date End Date Taking? Authorizing Provider  ALPRAZolam (XANAX) 0.25 MG tablet Take 1 tablet (0.25 mg total) by mouth every 8 (eight) hours as needed. 11/03/21  Yes Jarold Motto, PA  cholecalciferol (VITAMIN D3) 25 MCG (1000 UT) tablet Take 1,000 Units by mouth daily.   Yes [provider]  FLUoxetine (PROZAC) 20 MG capsule Take 1 capsule (20 mg total) by mouth daily. 08/11/22  Yes Worley, Lelon Mast, PA  fluticasone (FLONASE) 50 MCG/ACT nasal spray Place 1 spray into both nostrils daily.   Yes [provider]  Magnesium 250 MG TABS Take 250 mg by mouth daily.   Yes [provider]  Multiple Vitamin (MULTIVITAMIN) tablet Take 1 tablet by mouth daily.   Yes [provider]  norethindrone (INCASSIA) 0.35 MG tablet Take 1 tablet (0.35 mg total) by mouth daily. 12/28/22  Yes  Earley Favor, MD  propranolol (INDERAL) 10 MG tablet Take 1 tablet (10 mg total) by mouth every 12 (twelve) hours as needed. 12/05/22  Yes Worley, Lelon Mast, PA  Saline (SIMPLY SALINE) 0.9 % AERS Place 2 each into the nose as directed. Use nightly for sinus hygiene long-term.  Can also be used as many times daily as desired to assist with clearing congested sinuses. 07/05/22  Yes Lula Olszewski, MD  albuterol (VENTOLIN HFA) 108 (90 Base) MCG/ACT inhaler Inhale 1-2 puffs into the lungs every 6 (six) hours as needed. 11/29/21   Jarold Motto, PA  [employer] F[employer] GUMMY BEARS CHILDREN PO Take 2 Doses by mouth daily at 6 (six) AM.    [provider]  ferrous sulfate 324 MG TBEC Take 65 mg by mouth daily with breakfast.    [provider]  IBUPROFEN PO Take 600 mg by mouth every other day.    [provider]  SUMAtriptan (IMITREX) 100 MG tablet Take 1 tablet (100 mg total) by mouth once as needed for up to 1 dose for migraine. May repeat in 2 hours if headache persists or recurs. 07/25/22   Antony Madura, MD    Current Outpatient Medications  Medication Sig Dispense Refill   ALPRAZolam (XANAX) 0.25 MG tablet Take 1 tablet (0.25 mg total) by mouth every 8 (eight) hours as needed. 30 tablet 1   cholecalciferol (  VITAMIN D3) 25 MCG (1000 UT) tablet Take 1,000 Units by mouth daily.     FLUoxetine (PROZAC) 20 MG capsule Take 1 capsule (20 mg total) by mouth daily. 90 capsule 1   fluticasone (FLONASE) 50 MCG/ACT nasal spray Place 1 spray into both nostrils daily.     Magnesium 250 MG TABS Take 250 mg by mouth daily.     Multiple Vitamin (MULTIVITAMIN) tablet Take 1 tablet by mouth daily.     norethindrone (INCASSIA) 0.35 MG tablet Take 1 tablet (0.35 mg total) by mouth daily. 84 tablet 3   propranolol (INDERAL) 10 MG tablet Take 1 tablet (10 mg total) by mouth every 12 (twelve) hours as needed. 60 tablet 1   Saline (SIMPLY SALINE) 0.9 % AERS Place 2 each into the nose as directed.  Use nightly for sinus hygiene long-term.  Can also be used as many times daily as desired to assist with clearing congested sinuses. 127 mL 11   albuterol (VENTOLIN HFA) 108 (90 Base) MCG/ACT inhaler Inhale 1-2 puffs into the lungs every 6 (six) hours as needed. 6.7 g 2   [employer] F[employer] CHILDREN PO Take 2 Doses by mouth daily at 6 (six) AM.     ferrous sulfate 324 MG TBEC Take 65 mg by mouth daily with breakfast.     IBUPROFEN PO Take 600 mg by mouth every other day.     SUMAtriptan (IMITREX) 100 MG tablet Take 1 tablet (100 mg total) by mouth once as needed for up to 1 dose for migraine. May repeat in 2 hours if headache persists or recurs. 10 tablet 11   Current Facility-Administered Medications  Medication Dose Route Frequency Provider Last Rate Last Admin   0.9 %  sodium chloride infusion  500 mL Intravenous Once Napoleon Form, MD        Allergies as of 02/09/2023 - Review Complete 02/09/2023  Allergen Reaction Noted   Doxycycline Nausea Only 01/05/2021   Erythromycin Nausea And Vomiting 10/28/2018    Family History  Problem Relation Age of Onset   Irritable bowel syndrome Mother    COPD Mother    Depression Mother    Heart attack Father 22       x 2   Hypertension Father    Heart disease Father    Hypothyroidism Brother    Other Brother        sepsis from knee surgery   Breast cancer Maternal Aunt        dx mid 24s   Diabetes Maternal Aunt    Hypertension Maternal Aunt    Cancer Maternal Aunt    Depression Maternal Aunt    Hypertension Maternal Aunt    Breast cancer Maternal Grandmother        dx 30s, d. 30s   Cancer Maternal Grandmother    Heart attack Maternal Grandfather    Heart disease Maternal Grandfather    Breast cancer Paternal Grandmother        dx 38s; + vaginal cancer 60s, jaw cancer 41s   Cancer Paternal Grandmother    Heart attack Paternal Grandfather    OCD Daughter    Anxiety disorder Daughter    ADD / ADHD Son    Other Son         hypermobile EDS   Autism Son    Colon polyps Neg Hx    Colon cancer Neg Hx    Esophageal cancer Neg Hx    Stomach cancer Neg Hx  Rectal cancer Neg Hx     Social History   Socioeconomic History   Marital status: Married    Spouse name: Not on file   Number of children: Not on file   Years of education: Not on file   Highest education level: Associate degree: occupational, Scientist, product/process development, or vocational program  Occupational History   Not on file  Tobacco Use   Smoking status: Former    Current packs/day: 0.00    Types: Cigarettes    Start date: 04/24/1990    Quit date: 04/24/2005    Years since quitting: 17.8    Passive exposure: Never   Smokeless tobacco: Never  Vaping Use   Vaping status: Never Used  Substance and Sexual Activity   Alcohol use: Yes    Alcohol/week: 5.0 standard drinks of alcohol    Types: 5 Glasses of wine per week   Drug use: Never   Sexual activity: Yes    Birth control/protection: Pill, Other-see comments    Comment: Husband had vasectomy  Other Topics Concern   Not on file  Social History Narrative   Married   Two children    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 Right   Are you currently employed ? yes   What is your current occupation? home   Do you live at home alone? Husband and kids and Dad   Who lives with you?    What type of home do you live in: 1 story or 2 story? two    Caffeine 2-3 day   Social Determinants of Health   Financial Resource Strain: Low Risk  (11/21/2022)   Overall Financial Resource Strain (CARDIA)    Difficulty of Paying Living Expenses: Not hard at all  Food Insecurity: No Food Insecurity (11/21/2022)   Hunger Vital Sign    Worried About Running Out of Food in the Last Year: Never true    Ran Out of Food in the Last Year: Never true  Transportation Needs: No Transportation Needs (11/21/2022)   PRAPARE - Administrator, Civil Service  (Medical): No    Lack of Transportation (Non-Medical): No  Physical Activity: Sufficiently Active (11/21/2022)   Exercise Vital Sign    Days of Exercise per Week: 5 days    Minutes of Exercise per Session: 30 min  Stress: Stress Concern Present (11/21/2022)   [employer] of Occupational Health - Occupational Stress Questionnaire    Feeling of Stress : To some extent  Social Connections: Moderately Isolated (11/21/2022)   Social Connection and Isolation Panel [NHANES]    Frequency of Communication with Friends and Family: Three times a week    Frequency of Social Gatherings with Friends and Family: More than three times a week    Attends Religious Services: Never    Database administrator or Organizations: No    Attends Engineer, structural: Not on file    Marital Status: Married  Catering manager Violence: Not on file    Review of Systems:  All other review of systems negative except as mentioned in the HPI.  Physical Exam: Vital signs in last 24 hours: BP 120/74   Pulse 66   Temp (!) 97.2 F (36.2 C) (Temporal)   Ht 5\' 5"  (1.651 m)   Wt 195 lb (88.5 kg)   LMP 01/15/2023 Comment: husband vasectemy  SpO2 96%   BMI 32.45 kg/m  General:   Alert, NAD Lungs:  Clear .   Heart:  Regular rate and rhythm Abdomen:  Soft, nontender and nondistended. Neuro/Psych:  Alert and cooperative. Normal mood and affect. A and O x 3  Reviewed labs, radiology imaging, old records and pertinent past GI work up  Patient is appropriate for planned procedure(s) and anesthesia in an ambulatory setting   K. Scherry Ran , MD [phone]

## 2023-02-09 NOTE — Op Note (Signed)
[facility] Patient Name: Shirley Fleming Procedure Date: 02/09/2023 10:43 AM MRN: [id number] Endoscopist: Napoleon Form , MD, [id number] Age: 47 Referring MD:  Date of Birth: [birth year] Gender: Female Account #: [id number] Procedure:                Colonoscopy Indications:              Screening for colorectal malignant neoplasm Medicines:                Monitored Anesthesia Care Procedure:                Pre-Anesthesia Assessment:                           - Prior to the procedure, a History and Physical                            was performed, and patient medications and                            allergies were reviewed. The patient's tolerance of                            previous anesthesia was also reviewed. The risks                            and benefits of the procedure and the sedation                            options and risks were discussed with the patient.                            All questions were answered, and informed consent                            was obtained. Prior Anticoagulants: The patient has                            taken no anticoagulant or antiplatelet agents. ASA                            Grade Assessment: II - A patient with mild systemic                            disease. After reviewing the risks and benefits,                            the patient was deemed in satisfactory condition to                            undergo the procedure.                           After obtaining informed consent, the colonoscope  was passed under direct vision. Throughout the                            procedure, the patient's blood pressure, pulse, and                            oxygen saturations were monitored continuously. The                            [id number]-HQ190L Colonoscope [id number] was introduced                            through the anus and advanced to the the cecum,                            identified by  appendiceal orifice and ileocecal                            valve. The colonoscopy was performed without                            difficulty. The patient tolerated the procedure                            well. The quality of the bowel preparation was                            good. The ileocecal valve, appendiceal orifice, and                            rectum were photographed. Scope In: 10:50:44 AM Scope Out: 11:06:31 AM Scope Withdrawal Time: 0 hours 11 minutes 23 seconds  Total Procedure Duration: 0 hours 15 minutes 47 seconds  Findings:                 The perianal and digital rectal examinations were                            normal.                           A 4 mm polyp was found in the sigmoid colon. The                            polyp was sessile. The polyp was removed with a                            cold snare. Resection and retrieval were complete.                           Non-bleeding external and internal hemorrhoids were                            found during retroflexion. The hemorrhoids were  medium-sized. Complications:            No immediate complications. Estimated Blood Loss:     Estimated blood loss was minimal. Impression:               - One 4 mm polyp in the sigmoid colon, removed with                            a cold snare. Resected and retrieved.                           - Non-bleeding external and internal hemorrhoids. Recommendation:           - Patient has a contact number available for                            emergencies. The signs and symptoms of potential                            delayed complications were discussed with the                            patient. Return to normal activities tomorrow.                            Written discharge instructions were provided to the                            patient.                           - Resume previous diet.                           - Continue present  medications.                           - Await pathology results.                           - Repeat colonoscopy in 5-10 years for surveillance                            based on pathology results. Napoleon Form, MD 02/09/2023 11:15:51 AM This report has been signed electronically.

## 2023-02-09 NOTE — Progress Notes (Signed)
Called to room to assist during endoscopic procedure.  Patient ID and intended procedure confirmed with present staff. Received instructions for my participation in the procedure from the performing physician.  

## 2023-02-09 NOTE — Progress Notes (Signed)
Pt's states no medical or surgical changes since previsit or office visit. 

## 2023-02-12 ENCOUNTER — Telehealth: Payer: Self-pay | Admitting: *Deleted

## 2023-02-12 NOTE — Telephone Encounter (Signed)
  Follow up Call-     02/09/2023    9:49 AM  Call back number  Post procedure Call Back phone  # [phone]  Permission to leave phone message Yes     Patient questions:  Do you have a fever, pain , or abdominal swelling? No. Pain Score  0 *  Have you tolerated food without any problems? Yes.    Have you been able to return to your normal activities? Yes.    Do you have any questions about your discharge instructions: Diet   No. Medications  No. Follow up visit  No.  Do you have questions or concerns about your Care? No.  Actions: * If pain score is 4 or above: No action needed, pain <4.

## 2023-02-13 DIAGNOSIS — F419 Anxiety disorder, unspecified: Secondary | ICD-10-CM | POA: Diagnosis not present

## 2023-02-13 DIAGNOSIS — F331 Major depressive disorder, recurrent, moderate: Secondary | ICD-10-CM | POA: Diagnosis not present

## 2023-02-13 LAB — SURGICAL PATHOLOGY

## 2023-02-14 ENCOUNTER — Encounter: Payer: Self-pay | Admitting: *Deleted

## 2023-02-15 ENCOUNTER — Encounter: Payer: Self-pay | Admitting: Obstetrics and Gynecology

## 2023-02-15 ENCOUNTER — Other Ambulatory Visit (HOSPITAL_BASED_OUTPATIENT_CLINIC_OR_DEPARTMENT_OTHER): Payer: Self-pay

## 2023-02-15 ENCOUNTER — Ambulatory Visit (INDEPENDENT_AMBULATORY_CARE_PROVIDER_SITE_OTHER): Payer: BC Managed Care – PPO | Admitting: Obstetrics and Gynecology

## 2023-02-15 VITALS — BP 120/76 | HR 63 | Ht 65.0 in | Wt 199.0 lb

## 2023-02-15 DIAGNOSIS — N8003 Adenomyosis of the uterus: Secondary | ICD-10-CM | POA: Diagnosis not present

## 2023-02-15 DIAGNOSIS — N921 Excessive and frequent menstruation with irregular cycle: Secondary | ICD-10-CM

## 2023-02-15 DIAGNOSIS — Z01818 Encounter for other preprocedural examination: Secondary | ICD-10-CM

## 2023-02-15 DIAGNOSIS — N946 Dysmenorrhea, unspecified: Secondary | ICD-10-CM

## 2023-02-15 MED ORDER — METOCLOPRAMIDE HCL 10 MG PO TABS
10.0000 mg | ORAL_TABLET | Freq: Four times a day (QID) | ORAL | 0 refills | Status: DC | PRN
  Filled 2023-02-15: qty 5, 2d supply, fill #0

## 2023-02-15 MED ORDER — IBUPROFEN 800 MG PO TABS
800.0000 mg | ORAL_TABLET | Freq: Three times a day (TID) | ORAL | 1 refills | Status: DC | PRN
  Filled 2023-02-15: qty 30, 10d supply, fill #0
  Filled 2023-06-13: qty 30, 10d supply, fill #1

## 2023-02-15 MED ORDER — OXYCODONE HCL 5 MG PO TABS
5.0000 mg | ORAL_TABLET | ORAL | 0 refills | Status: DC | PRN
  Filled 2023-02-15: qty 8, 2d supply, fill #0

## 2023-02-15 NOTE — Progress Notes (Signed)
PREOP H&P for [facility], bilateral salpingectomy, cystoscopy  47 y.o. y.o. female here for annual exam. Patient with debilitating periods. She is bleeding for 7-10 days with multiple clots and pain.  She cannot predict when she will have a period and has to carry supplies with her.  She is changing her pad and tampon Q59min to 1 hour when she has her periods. She cannot take any estrogen birth control due to her migraines. She is taking the progesterone only birth control and this has not helped as well. Last TV US with 10cm uterus with likely adenomyosis. She does not desire any more children and her husband has had the vasectomy.  Reports bleeding most of September and now starting again.  She is ready to proceed with the hysterectomy and is frustrated with the bleeding and pain.  Blood pressure 120/76, pulse 63, height 5\' 5"  (1.651 m), weight 199 lb (90.3 kg), last menstrual period 02/10/2023, SpO2 97%.     Component Value Date/Time   DIAGPAP  12/28/2022 1345    - Negative for intraepithelial lesion or malignancy (NILM)   DIAGPAP  07/08/2019 1509    - Negative for intraepithelial lesion or malignancy (NILM)   HPVHIGH Negative 07/08/2019 1509   ADEQPAP  12/28/2022 1345    Satisfactory for evaluation; transformation zone component PRESENT.   ADEQPAP  07/08/2019 1509    Satisfactory for evaluation; transformation zone component PRESENT.    GYN HISTORY:    Component Value Date/Time   DIAGPAP  12/28/2022 1345    - Negative for intraepithelial lesion or malignancy (NILM)   DIAGPAP  07/08/2019 1509    - Negative for intraepithelial lesion or malignancy (NILM)   HPVHIGH Negative 07/08/2019 1509   ADEQPAP  12/28/2022 1345    Satisfactory for evaluation; transformation zone component PRESENT.   ADEQPAP  07/08/2019 1509    Satisfactory for evaluation; transformation zone component PRESENT.    OB History  Gravida Para Term Preterm AB Living  2 2 0 0 0 2  SAB IAB Ectopic Multiple Live  Births  0 0 0 0 0    # Outcome Date GA Lbr Len/2nd Weight Sex Type Anes PTL Lv  2 Para      Vag-Spont     1 Para      Vag-Spont       Past Medical History:  Diagnosis Date   Anemia    takes iron- heavy menstral cycle   Anxiety    on meds   Asthma    uses inhaler with exercise;   Depression 1995   Off and on since teens   GERD (gastroesophageal reflux disease)    OTC PRN meds   History of COVID-19 12/23/2020   Hypertension Jan 2021   Hypoglycemia    Migraine with aura    Seasonal allergies     Past Surgical History:  Procedure Laterality Date   APPENDECTOMY     TONSILLECTOMY AND ADENOIDECTOMY      Current Outpatient Medications on File Prior to Visit  Medication Sig Dispense Refill   albuterol (VENTOLIN HFA) 108 (90 Base) MCG/ACT inhaler Inhale 1-2 puffs into the lungs every 6 (six) hours as needed. 6.7 g 2   ALPRAZolam (XANAX) 0.25 MG tablet Take 1 tablet (0.25 mg total) by mouth every 8 (eight) hours as needed. 30 tablet 1   cholecalciferol (VITAMIN D3) 25 MCG (1000 UT) tablet Take 1,000 Units by mouth daily.     ferrous sulfate 324 MG TBEC Take 65 mg by  mouth daily with breakfast.     FLUoxetine (PROZAC) 20 MG capsule Take 1 capsule (20 mg total) by mouth daily. 90 capsule 1   fluticasone (FLONASE) 50 MCG/ACT nasal spray Place 1 spray into both nostrils daily.     IBUPROFEN PO Take 600 mg by mouth every other day.     Magnesium 250 MG TABS Take 250 mg by mouth daily.     Multiple Vitamin (MULTIVITAMIN) tablet Take 1 tablet by mouth daily.     norethindrone (INCASSIA) 0.35 MG tablet Take 1 tablet (0.35 mg total) by mouth daily. 84 tablet 3   propranolol (INDERAL) 10 MG tablet Take 1 tablet (10 mg total) by mouth every 12 (twelve) hours as needed. 60 tablet 1   Saline (SIMPLY SALINE) 0.9 % AERS Place 2 each into the nose as directed. Use nightly for sinus hygiene long-term.  Can also be used as many times daily as desired to assist with clearing congested sinuses. 127 mL  11   SUMAtriptan (IMITREX) 100 MG tablet Take 1 tablet (100 mg total) by mouth once as needed for up to 1 dose for migraine. May repeat in 2 hours if headache persists or recurs. 10 tablet 11   UNABLE TO FIND Med Name: fiber gummy takes 2     No current facility-administered medications on file prior to visit.    Social History   Socioeconomic History   Marital status: Married    Spouse name: Not on file   Number of children: Not on file   Years of education: Not on file   Highest education level: Associate degree: occupational, Scientist, product/process development, or vocational program  Occupational History   Not on file  Tobacco Use   Smoking status: Former    Current packs/day: 0.00    Types: Cigarettes    Start date: 04/24/1990    Quit date: 04/24/2005    Years since quitting: 17.8    Passive exposure: Never   Smokeless tobacco: Never  Vaping Use   Vaping status: Never Used  Substance and Sexual Activity   Alcohol use: Yes    Alcohol/week: 5.0 standard drinks of alcohol    Types: 5 Glasses of wine per week   Drug use: Never   Sexual activity: Yes    Birth control/protection: Pill, Other-see comments    Comment: Husband had vasectomy  Other Topics Concern   Not on file  Social History Narrative   Married   Two children    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 Right   Are you currently employed ? yes   What is your current occupation? home   Do you live at home alone? Husband and kids and Dad   Who lives with you?    What type of home do you live in: 1 story or 2 story? two    Caffeine 2-3 day   Social Determinants of Health   Financial Resource Strain: Low Risk  (11/21/2022)   Overall Financial Resource Strain (CARDIA)    Difficulty of Paying Living Expenses: Not hard at all  Food Insecurity: No Food Insecurity (11/21/2022)   Hunger Vital Sign    Worried About Running Out of Food in the Last Year: Never true    Ran  Out of Food in the Last Year: Never true  Transportation Needs: No Transportation Needs (11/21/2022)   PRAPARE - Administrator, Civil Service (  Medical): No    Lack of Transportation (Non-Medical): No  Physical Activity: Sufficiently Active (11/21/2022)   Exercise Vital Sign    Days of Exercise per Week: 5 days    Minutes of Exercise per Session: 30 min  Stress: Stress Concern Present (11/21/2022)   [employer] of Occupational Health - Occupational Stress Questionnaire    Feeling of Stress : To some extent  Social Connections: Moderately Isolated (11/21/2022)   Social Connection and Isolation Panel [NHANES]    Frequency of Communication with Friends and Family: Three times a week    Frequency of Social Gatherings with Friends and Family: More than three times a week    Attends Religious Services: Never    Database administrator or Organizations: No    Attends Engineer, structural: Not on file    Marital Status: Married  Catering manager Violence: Not on file    Family History  Problem Relation Age of Onset   Irritable bowel syndrome Mother    COPD Mother    Depression Mother    Heart attack Father 88       x 2   Hypertension Father    Heart disease Father    Hypothyroidism Brother    Other Brother        sepsis from knee surgery   Breast cancer Maternal Aunt        dx mid 43s   Diabetes Maternal Aunt    Hypertension Maternal Aunt    Cancer Maternal Aunt    Depression Maternal Aunt    Hypertension Maternal Aunt    Breast cancer Maternal Grandmother        dx 30s, d. 30s   Cancer Maternal Grandmother    Heart attack Maternal Grandfather    Heart disease Maternal Grandfather    Breast cancer Paternal Grandmother        dx 23s; + vaginal cancer 60s, jaw cancer 57s   Cancer Paternal Grandmother    Heart attack Paternal Grandfather    OCD Daughter    Anxiety disorder Daughter    ADD / ADHD Son    Other Son        hypermobile EDS   Autism Son     Colon polyps Neg Hx    Colon cancer Neg Hx    Esophageal cancer Neg Hx    Stomach cancer Neg Hx    Rectal cancer Neg Hx      Allergies  Allergen Reactions   Doxycycline Nausea Only   Erythromycin Nausea And Vomiting      Patient's last menstrual period was Patient's last menstrual period was 02/10/2023.Marland Kitchen            Review of Systems Alls systems reviewed and are negative.     PE General appearance: alert, cooperative and appears stated age Head: Normocephalic, without obvious abnormality, atraumatic Neck: no adenopathy, supple, symmetrical, trachea midline and thyroid normal to inspection and palpation Lungs: clear to auscultation bilaterally Breasts: normal appearance, no masses or tenderness Heart: regular rate and rhythm Abdomen: soft, non-tender; bowel sounds normal; no masses,  no organomegaly Extremities: extremities normal, atraumatic, no cyanosis or edema Skin: Skin color, texture, turgor normal. No rashes or lesions Lymph nodes: Cervical, supraclavicular, and axillary nodes normal. No abnormal inguinal nodes palpated Neurologic: Grossly normal     Pelvic: External genitalia:  no lesions              Urethra:  normal appearing urethra with no masses, tenderness or  lesions              Bartholins and Skenes: normal                 Vagina: normal appearing vagina with normal color and discharge, no lesions.               Cervix: no lesions, no cervical motion tenderness               Bimanual Exam:  Uterus:  increase size, boggy contour, tender to light palpation              Adnexa: no mass, fullness, tenderness          Chaperone was present for exam. From last visit   A:         Well Woman GYN exam, DUB, menorrhagia, dysmenorrhea, adenomyosis for preop for RLH, bilateral salpingectomy, cystoscopy                             P:       The risk, benefits, alternatives of the procedure were discussed with patient including but not limited to the risk for  bleeding, infection, injury to surrounding structures such as the bowel, vessels, bladder, and ureters were reviewed.  The risk for blood products and risk for an additional procedure, and risk for laparotomy in an extreme event was discussed.  The risk for blood clots are also discussed.  Postoperative care instructions were discussed and reviewed with patient.  Postoperative medications were sent to her pharmacy and patient was instructed to pick up prior to surgery.  She will have a driver to take her there and take her home and she agrees that that person will stay with her overnight. Post care instructions were also put in the wrap-up section of this note for patient, as a reminder of care instructions. All questions were answered and patient agrees and would like to proceed with the procedure.  Earley Favor

## 2023-02-15 NOTE — H&P (Signed)
Expand All Collapse All  PREOP H&P for [facility], bilateral salpingectomy, cystoscopy   47 y.o. y.o. female here for annual exam. Patient with debilitating periods. She is bleeding for 7-10 days with multiple clots and pain.  She cannot predict when she will have a period and has to carry supplies with her.  She is changing her pad and tampon Q54min to 1 hour when she has her periods. She cannot take any estrogen birth control due to her migraines. She is taking the progesterone only birth control and this has not helped as well. Last TV US with 10cm uterus with likely adenomyosis. She does not desire any more children and her husband has had the vasectomy.   Reports bleeding most of September and now starting again.  She is ready to proceed with the hysterectomy and is frustrated with the bleeding and pain.   Blood pressure 120/76, pulse 63, height 5\' 5"  (1.651 m), weight 199 lb (90.3 kg), last menstrual period 02/10/2023, SpO2 97%.   Labs (Brief)           Component Value Date/Time    DIAGPAP   12/28/2022 1345      - Negative for intraepithelial lesion or malignancy (NILM)    DIAGPAP   07/08/2019 1509      - Negative for intraepithelial lesion or malignancy (NILM)    HPVHIGH Negative 07/08/2019 1509    ADEQPAP   12/28/2022 1345      Satisfactory for evaluation; transformation zone component PRESENT.    ADEQPAP   07/08/2019 1509      Satisfactory for evaluation; transformation zone component PRESENT.        GYN HISTORY: Labs (Brief)           Component Value Date/Time    DIAGPAP   12/28/2022 1345      - Negative for intraepithelial lesion or malignancy (NILM)    DIAGPAP   07/08/2019 1509      - Negative for intraepithelial lesion or malignancy (NILM)    HPVHIGH Negative 07/08/2019 1509    ADEQPAP   12/28/2022 1345      Satisfactory for evaluation; transformation zone component PRESENT.    ADEQPAP   07/08/2019 1509      Satisfactory for evaluation; transformation zone component  PRESENT.                        OB History  Gravida Para Term Preterm AB Living  2 2 0 0 0 2  SAB IAB Ectopic Multiple Live Births     0 0 0 0 0        # Outcome Date GA Lbr Len/2nd Weight Sex Type Anes PTL Lv  2 Para           Vag-Spont        1 Para           Vag-Spont                Past Medical History:  Diagnosis Date   Anemia      takes iron- heavy menstral cycle   Anxiety      on meds   Asthma      uses inhaler with exercise;   Depression 1995    Off and on since teens   GERD (gastroesophageal reflux disease)      OTC PRN meds   History of COVID-19 12/23/2020   Hypertension Jan 2021   Hypoglycemia     Migraine  with aura     Seasonal allergies                 Past Surgical History:  Procedure Laterality Date   APPENDECTOMY       TONSILLECTOMY AND ADENOIDECTOMY              Medications Ordered Prior to Encounter        Current Outpatient Medications on File Prior to Visit  Medication Sig Dispense Refill   albuterol (VENTOLIN HFA) 108 (90 Base) MCG/ACT inhaler Inhale 1-2 puffs into the lungs every 6 (six) hours as needed. 6.7 g 2   ALPRAZolam (XANAX) 0.25 MG tablet Take 1 tablet (0.25 mg total) by mouth every 8 (eight) hours as needed. 30 tablet 1   cholecalciferol (VITAMIN D3) 25 MCG (1000 UT) tablet Take 1,000 Units by mouth daily.       ferrous sulfate 324 MG TBEC Take 65 mg by mouth daily with breakfast.       FLUoxetine (PROZAC) 20 MG capsule Take 1 capsule (20 mg total) by mouth daily. 90 capsule 1   fluticasone (FLONASE) 50 MCG/ACT nasal spray Place 1 spray into both nostrils daily.       IBUPROFEN PO Take 600 mg by mouth every other day.       Magnesium 250 MG TABS Take 250 mg by mouth daily.       Multiple Vitamin (MULTIVITAMIN) tablet Take 1 tablet by mouth daily.       norethindrone (INCASSIA) 0.35 MG tablet Take 1 tablet (0.35 mg total) by mouth daily. 84 tablet 3   propranolol (INDERAL) 10 MG tablet Take 1 tablet (10 mg total) by mouth  every 12 (twelve) hours as needed. 60 tablet 1   Saline (SIMPLY SALINE) 0.9 % AERS Place 2 each into the nose as directed. Use nightly for sinus hygiene long-term.  Can also be used as many times daily as desired to assist with clearing congested sinuses. 127 mL 11   SUMAtriptan (IMITREX) 100 MG tablet Take 1 tablet (100 mg total) by mouth once as needed for up to 1 dose for migraine. May repeat in 2 hours if headache persists or recurs. 10 tablet 11   UNABLE TO FIND Med Name: fiber gummy takes 2        No current facility-administered medications on file prior to visit.        Social History         Socioeconomic History   Marital status: Married      Spouse name: Not on file   Number of children: Not on file   Years of education: Not on file   Highest education level: Associate degree: occupational, Scientist, product/process development, or vocational program  Occupational History   Not on file  Tobacco Use   Smoking status: Former      Current packs/day: 0.00      Types: Cigarettes      Start date: 04/24/1990      Quit date: 04/24/2005      Years since quitting: 17.8      Passive exposure: Never   Smokeless tobacco: Never  Vaping Use   Vaping status: Never Used  Substance and Sexual Activity   Alcohol use: Yes      Alcohol/week: 5.0 standard drinks of alcohol      Types: 5 Glasses of wine per week   Drug use: Never   Sexual activity: Yes      Birth control/protection: Pill, Other-see comments  Comment: Husband had vasectomy  Other Topics Concern   Not on file  Social History Narrative    Married    Two children     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 Right    Are you currently employed ? yes    What is your current occupation? home    Do you live at home alone? Husband and kids and Dad    Who lives with you?     What type of home do you live in: 1 story or 2 story? two     Caffeine 2-3 day    Social Determinants  of Health        Financial Resource Strain: Low Risk  (11/21/2022)    Overall Financial Resource Strain (CARDIA)     Difficulty of Paying Living Expenses: Not hard at all  Food Insecurity: No Food Insecurity (11/21/2022)    Hunger Vital Sign     Worried About Running Out of Food in the Last Year: Never true     Ran Out of Food in the Last Year: Never true  Transportation Needs: No Transportation Needs (11/21/2022)    PRAPARE - Therapist, art (Medical): No     Lack of Transportation (Non-Medical): No  Physical Activity: Sufficiently Active (11/21/2022)    Exercise Vital Sign     Days of Exercise per Week: 5 days     Minutes of Exercise per Session: 30 min  Stress: Stress Concern Present (11/21/2022)    [employer] of Occupational Health - Occupational Stress Questionnaire     Feeling of Stress : To some extent  Social Connections: Moderately Isolated (11/21/2022)    Social Connection and Isolation Panel [NHANES]     Frequency of Communication with Friends and Family: Three times a week     Frequency of Social Gatherings with Friends and Family: More than three times a week     Attends Religious Services: Never     Database administrator or Organizations: No     Attends Engineer, structural: Not on file     Marital Status: Married  Catering manager Violence: Not on file           Family History  Problem Relation Age of Onset   Irritable bowel syndrome Mother     COPD Mother     Depression Mother     Heart attack Father 31        x 2   Hypertension Father     Heart disease Father     Hypothyroidism Brother     Other Brother          sepsis from knee surgery   Breast cancer Maternal Aunt          dx mid 16s   Diabetes Maternal Aunt     Hypertension Maternal Aunt     Cancer Maternal Aunt     Depression Maternal Aunt     Hypertension Maternal Aunt     Breast cancer Maternal Grandmother          dx 30s, d. 30s   Cancer Maternal  Grandmother     Heart attack Maternal Grandfather     Heart disease Maternal Grandfather     Breast cancer Paternal Grandmother          dx 55s; + vaginal cancer 60s, jaw  cancer 75s   Cancer Paternal Grandmother     Heart attack Paternal Grandfather     OCD Daughter     Anxiety disorder Daughter     ADD / ADHD Son     Other Son          hypermobile EDS   Autism Son     Colon polyps Neg Hx     Colon cancer Neg Hx     Esophageal cancer Neg Hx     Stomach cancer Neg Hx     Rectal cancer Neg Hx              Allergies      Allergies  Allergen Reactions   Doxycycline Nausea Only   Erythromycin Nausea And Vomiting            Patient's last menstrual period was Patient's last menstrual period was 02/10/2023.Marland Kitchen              Review of Systems Alls systems reviewed and are negative.     PE General appearance: alert, cooperative and appears stated age Head: Normocephalic, without obvious abnormality, atraumatic Neck: no adenopathy, supple, symmetrical, trachea midline and thyroid normal to inspection and palpation Lungs: clear to auscultation bilaterally Breasts: normal appearance, no masses or tenderness Heart: regular rate and rhythm Abdomen: soft, non-tender; bowel sounds normal; no masses,  no organomegaly Extremities: extremities normal, atraumatic, no cyanosis or edema Skin: Skin color, texture, turgor normal. No rashes or lesions Lymph nodes: Cervical, supraclavicular, and axillary nodes normal. No abnormal inguinal nodes palpated Neurologic: Grossly normal     Pelvic: External genitalia:  no lesions              Urethra:  normal appearing urethra with no masses, tenderness or lesions              Bartholins and Skenes: normal                 Vagina: normal appearing vagina with normal color and discharge, no lesions.               Cervix: no lesions, no cervical motion tenderness               Bimanual Exam:  Uterus:  increase size, boggy contour, tender to  light palpation              Adnexa: no mass, fullness, tenderness          Chaperone was present for exam. From last visit   A:         Well Woman GYN exam, DUB, menorrhagia, dysmenorrhea, adenomyosis for preop for RLH, bilateral salpingectomy, cystoscopy                             P:       The risk, benefits, alternatives of the procedure were discussed with patient including but not limited to the risk for bleeding, infection, injury to surrounding structures such as the bowel, vessels, bladder, and ureters were reviewed.  The risk for blood products and risk for an additional procedure, and risk for laparotomy in an extreme event was discussed.  The risk for blood clots are also discussed.  Postoperative care instructions were discussed and reviewed with patient.  Postoperative medications were sent to her pharmacy and patient was instructed to pick up prior to surgery.  She will have a driver to take her there and take her  home and she agrees that that person will stay with her overnight. Post care instructions were also put in the wrap-up section of this note for patient, as a reminder of care instructions. All questions were answered and patient agrees and would like to proceed with the procedure.   Earley Favor

## 2023-02-15 NOTE — Patient Instructions (Signed)
Robotic Laparoscopic Hysterectomy, Care After  The following information offers guidance on how to care for yourself after your procedure. Your health care provider may also give you more specific instructions. If you have problems or questions, contact your health care provider. What can I expect after the procedure? After the procedure, it is common to have: Pain, bruising, and numbness around your incisions. Tiredness (fatigue). Poor appetite. Less interest in sex. Vaginal discharge or bleeding. You will need to use a sanitary pad after this procedure.  HEAVY BLEEDING LIKE A PERIOD IS NOT NORMAL.  PLEASE CALL YOUR PROVIDER IF SOAKING A PAD. Feelings of sadness or other emotions. If your ovaries were also removed, it is also common to have symptoms of menopause, such as hot flashes, night sweats, and lack of sleep (insomnia).  Ovaries should stay in if at all possible until at least the age of 30. Follow these instructions at home: Medicines Take over-the-counter and prescription medicines only as told by your health care provider. Ask your health care provider if the medicine prescribed to you: Requires you to avoid driving or using machinery. Can cause constipation. You may need to take these actions to prevent or treat constipation: Drink enough fluid to keep your urine pale yellow. Take over-the-counter or prescription medicines. Eat foods that are high in fiber, such as beans, whole grains, and fresh fruits and vegetables. Limit foods that are high in fat and processed sugars, such as fried or sweet foods.  Also, avoid spicy foods.  NAUSEA IS COMMON THE FIRST NIGHT OF SURGERY.  IF IT LASTS BEYOND 24 HOURS, CALL YOUR PROVIDER.  NAUSEA MEDICATION WAS GIVEN AT YOUR PREOP APPOINTMENT THAT YOU CAN TAKE AFTER SURGERY. Incision care  Follow instructions from your health care provider about how to take care of your incisions. Make sure you: LEAVE INCISION OPEN AND DRY-NO BANDAGES Leave  stitches (sutures), skin glue, or adhesive strips in place UNTIL 2 WEEKS THEN REMOVE IN THE SHOWER.  If adhesive strip edges start to loosen and curl up, you may trim the loose edges. Check your incision areas every day for signs of infection. Check for: More redness, swelling, or pain. Fluid or blood. Warmth. Pus or a bad smell. Activity  Rest as told by your health care provider. Avoid sitting for a long time without moving. Get up to take short walks every 1-2 hours. This is important to improve blood flow and breathing. Ask for help if you feel weak or unsteady. Return to your normal activities as told by your health care provider. Ask your health care provider what activities are safe for you. Do not lift anything that is heavier than 10 lb (4.5 kg), or the limit that you are told, for 8 WEEKS after surgery or until your health care provider says that it is safe. If you were given a sedative during the procedure, it can affect you for several hours. Do not drive or operate machinery until your health care provider says that it is safe. Lifestyle Do not use any products that contain nicotine or tobacco. These products include cigarettes, chewing tobacco, and vaping devices, such as e-cigarettes. These can delay healing after surgery. If you need help quitting, ask your health care provider. Do not drink alcohol until your health care provider approves. General instructions FOR 2 WEEKS AFTER SURGERY, THEN YOU MAY USE TUBS AND HOT TUBS Do not douche, use tampons, or have sex for at least 10 weeks, or as told by your health care  provider. If you struggle with physical or emotional changes after your procedure, speak with your health care provider or a therapist. Do not take baths, swim, or use a hot tub until your health care provider approves. You may only be allowed to take showers for 2 weeks. IF YOU HAVE BURNING WITH URINATION, PLEASE CALL YOUR DOCTOR. BLADDER INFECTIONS MAY OCCUR AFTER  SURGERY Try to have someone at home with you for the first 1-2 weeks to help with your daily chores. Wear compression stockings as told by your health care provider. These stockings help to prevent blood clots and reduce swelling in your legs. Keep all follow-up visits. This is important. Contact a health care provider if: You have any of these signs of infection: Chills or a fever 125f OR GREATER. More redness, swelling, or pain around an incision. Fluid or blood coming from an incision. Warmth coming from an incision. Pus or a bad smell coming from an incision. Burning with urination. Urinary frequency or cramping.   IF YOU HAVE THESE SYMPTOMS, PLEASE CALL THE OFFICE TO COME EVALUATE FOR A BLADDER INFECTION AT [phone] An incision opens. You feel dizzy or light-headed. You have pain or bleeding when you urinate, or you are unable to urinate. You have abnormal vaginal discharge. You have pain that does not get better with medicine. Get help right away if: You have a fever and your symptoms suddenly get worse. You have severe abdominal pain. You have chest pain or shortness of breath. You may have chest pain and shortness of breath from the CO2 gas for a few days after surgery.  This is very common.  Walking, Gas-X and motrin will usually help relieve this discomfort You faint. You have pain, swelling, or redness in your leg. You have heavy vaginal bleeding with blood clots, soaking through a sanitary pad in less than 1 hour. These symptoms may represent a serious problem that is an emergency. Do not wait to see if the symptoms will go away. Get medical help right away. Call your local emergency services (911 in the [place] Do not drive yourself to the hospital. Summary  CONSTIPATION MEDICATION AFTER SURGERY: COLACE, MOM, MIRALAX, GAS X are all helpful to have on hand, if needed.  FILL ALL POSTOP MEDICATION BEFORE SURGERY  After the procedure, it is common to have pain and bruising  around your incisions. Do not take baths, swim, or use a hot tub until your health care provider approves. Do not lift anything that is heavier than 8- 10 lb (4.5 kg), or the limit that you are told, for one month after surgery or until your health care provider says that it is safe. Tell your health care provider if you have any signs or symptoms of infection after the procedure. Get help right away if you have severe abdominal pain, chest pain, shortness of breath, or heavy bleeding from your vagina. This information is not intended to replace advice given to you by your  health care provider. Make sure you discuss any questions you have with your health care provider. Document Revised: 12/11/2019 Document Reviewed: 12/12/2019 Elsevier Patient Education  2024 [employer].

## 2023-02-22 ENCOUNTER — Encounter: Payer: Self-pay | Admitting: Gastroenterology

## 2023-02-23 ENCOUNTER — Other Ambulatory Visit: Payer: Self-pay | Admitting: Physician Assistant

## 2023-02-23 ENCOUNTER — Other Ambulatory Visit (HOSPITAL_BASED_OUTPATIENT_CLINIC_OR_DEPARTMENT_OTHER): Payer: Self-pay

## 2023-02-23 ENCOUNTER — Other Ambulatory Visit: Payer: Self-pay

## 2023-02-23 MED ORDER — PROPRANOLOL HCL 10 MG PO TABS
10.0000 mg | ORAL_TABLET | Freq: Two times a day (BID) | ORAL | 1 refills | Status: DC | PRN
  Filled 2023-02-23: qty 60, 30d supply, fill #0
  Filled 2023-04-09: qty 60, 30d supply, fill #1

## 2023-02-27 DIAGNOSIS — F331 Major depressive disorder, recurrent, moderate: Secondary | ICD-10-CM | POA: Diagnosis not present

## 2023-02-27 DIAGNOSIS — F419 Anxiety disorder, unspecified: Secondary | ICD-10-CM | POA: Diagnosis not present

## 2023-02-28 ENCOUNTER — Encounter (HOSPITAL_BASED_OUTPATIENT_CLINIC_OR_DEPARTMENT_OTHER): Payer: Self-pay | Admitting: Obstetrics and Gynecology

## 2023-03-01 ENCOUNTER — Encounter: Payer: Self-pay | Admitting: Dermatology

## 2023-03-01 ENCOUNTER — Ambulatory Visit: Payer: BC Managed Care – PPO | Admitting: Dermatology

## 2023-03-01 VITALS — BP 122/79 | HR 73

## 2023-03-01 DIAGNOSIS — L821 Other seborrheic keratosis: Secondary | ICD-10-CM

## 2023-03-01 DIAGNOSIS — L820 Inflamed seborrheic keratosis: Secondary | ICD-10-CM

## 2023-03-01 NOTE — Patient Instructions (Addendum)
Cryotherapy Aftercare  Wash gently with soap and water everyday.   Apply Vaseline and Band-Aid daily until healed.  Patient Handout: Wound Care for Skin Biopsy Site  Taking Care of Your Skin Biopsy Site  Proper care of the biopsy site is essential for promoting healing and minimizing scarring. This handout provides instructions on how to care for your biopsy site to ensure optimal recovery.  1. Cleaning the Wound:  Clean the biopsy site daily with gentle soap and water. Gently pat the area dry with a clean, soft towel. Avoid harsh scrubbing or rubbing the area, as this can irritate the skin and delay healing.  2. Applying Aquaphor and Bandage:  After cleaning the wound, apply a thin layer of Aquaphor ointment to the biopsy site. Cover the area with a sterile bandage to protect it from dirt, bacteria, and friction. Change the bandage daily or as needed if it becomes soiled or wet.  3. Continued Care for One Week:  Repeat the cleaning, Aquaphor application, and bandaging process daily for one week following the biopsy procedure. Keeping the wound clean and moist during this initial healing period will help prevent infection and promote optimal healing.  4. Massaging Aquaphor into the Area:  ---After one week, discontinue the use of bandages but continue to apply Aquaphor to the biopsy site. ----Gently massage the Aquaphor into the area using circular motions. ---Massaging the skin helps to promote circulation and prevent the formation of scar tissue.   Additional Tips:  Avoid exposing the biopsy site to direct sunlight during the healing process, as this can cause hyperpigmentation or worsen scarring. If you experience any signs of infection, such as increased redness, swelling, warmth, or drainage from the wound, contact your healthcare provider immediately. Follow any additional instructions provided by your healthcare provider for caring for the biopsy site and managing any  discomfort. Conclusion:  Taking proper care of your skin biopsy site is crucial for ensuring optimal healing and minimizing scarring. By following these instructions for cleaning, applying Aquaphor, and massaging the area, you can promote a smooth and successful recovery. If you have any questions or concerns about caring for your biopsy site, don't hesitate to contact your healthcare provider for guidance.    Important Information   Due to recent changes in healthcare laws, you may see results of your pathology and/or laboratory studies on MyChart before the doctors have had a chance to review them. We understand that in some cases there may be results that are confusing or concerning to you. Please understand that not all results are received at the same time and often the doctors may need to interpret multiple results in order to provide you with the best plan of care or course of treatment. Therefore, we ask that you please give [place] 2 business days to thoroughly review all your results before contacting the office for clarification. Should we see a critical lab result, you will be contacted sooner.     If You Need Anything After Your Visit   If you have any questions or concerns for your doctor, please call our main line at [phone]. If no one answers, please leave a voicemail as directed and we will return your call as soon as possible. Messages left after 4 pm will be answered the following business day.    You may also send [place] a message via MyChart. We typically respond to MyChart messages within 1-2 business days.  For prescription refills, please ask your pharmacy to contact our  office. Our fax number is [phone].  If you have an urgent issue when the clinic is closed that cannot wait until the next business day, you can page your doctor at the number below.     Please note that while we do our best to be available for urgent issues outside of office hours, we are not available  24/7.    If you have an urgent issue and are unable to reach [place], you may choose to seek medical care at your doctor's office, retail clinic, urgent care center, or emergency room.   If you have a medical emergency, please immediately call 911 or go to the emergency department. In the event of inclement weather, please call our main line at [phone] for an update on the status of any delays or closures.  Dermatology Medication Tips: Please keep the boxes that topical medications come in in order to help keep track of the instructions about where and how to use these. Pharmacies typically print the medication instructions only on the boxes and not directly on the medication tubes.   If your medication is too expensive, please contact our office at [phone] or send [place] a message through MyChart.    We are unable to tell what your co-pay for medications will be in advance as this is different depending on your insurance coverage. However, we may be able to find a substitute medication at lower cost or fill out paperwork to get insurance to cover a needed medication.    If a prior authorization is required to get your medication covered by your insurance company, please allow [place] 1-2 business days to complete this process.   Drug prices often vary depending on where the prescription is filled and some pharmacies may offer cheaper prices.   The website www.goodrx.com contains coupons for medications through different pharmacies. The prices here do not account for what the cost may be with help from insurance (it may be cheaper with your insurance), but the website can give you the price if you did not use any insurance.  - You can print the associated coupon and take it with your prescription to the pharmacy.  - You may also stop by our office during regular business hours and pick up a GoodRx coupon card.  - If you need your prescription sent electronically to a different pharmacy, notify our  office through [facility] or by phone at [phone]

## 2023-03-01 NOTE — Progress Notes (Signed)
   New Patient Visit   Subjective  Shirley Fleming is a 47 y.o. female who presents for the following: Growths  Patient states she has growths located at the scalp and back that she would like to have examined. Patient reports the areas have been there for several years. She reports the areas are bothersome. Patient rates irritation 5 out of 10. She states that the areas have not spread. Patient reports she has previously been treated for these areas. Patient reports Hx of bx. Patient denies family history of skin cancer(s).  The following portions of the chart were reviewed this encounter and updated as appropriate: medications, allergies, medical history  Review of Systems:  No other skin or systemic complaints except as noted in HPI or Assessment and Plan.  Objective  Well appearing patient in no apparent distress; mood and affect are within normal limits.  A focused examination was performed of the following areas: Scalp And back  Relevant exam findings are noted in the Assessment and Plan.  Left Lower Back, Mid Frontal Scalp, Neck - Anterior, Right Upper Back Stuck-on, waxy, tan-brown papules and/or plaques         Assessment & Plan   SEBORRHEIC KERATOSIS - Stuck-on, waxy, tan-brown papules and/or plaques  - Most are Benign-appearing, a few are irritated, we will treat the irritated ones today - Discussed benign etiology and prognosis. - Observe - Call for any changes   Inflamed seborrheic keratosis (4) Neck - Anterior; Right Upper Back; Left Lower Back; Mid Frontal Scalp  Will treat today because they are itchy and irritated  Destruction of lesion - Left Lower Back, Mid Frontal Scalp, Neck - Anterior, Right Upper Back Complexity: simple   Destruction method: cryotherapy   Informed consent: discussed and consent obtained   Timeout:  patient name, date of birth, surgical site, and procedure verified Lesion destroyed using liquid nitrogen: Yes   Cryotherapy cycles:   4 Post-procedure details: wound care instructions given     No follow-ups on file.    Documentation: I have reviewed the above documentation for accuracy and completeness, and I agree with the above.  Stasia Cavalier, am acting as scribe for Langston Reusing, DO.  Langston Reusing, DO

## 2023-03-02 ENCOUNTER — Encounter (HOSPITAL_BASED_OUTPATIENT_CLINIC_OR_DEPARTMENT_OTHER): Payer: Self-pay | Admitting: Obstetrics and Gynecology

## 2023-03-05 ENCOUNTER — Other Ambulatory Visit: Payer: Self-pay

## 2023-03-05 ENCOUNTER — Encounter (HOSPITAL_BASED_OUTPATIENT_CLINIC_OR_DEPARTMENT_OTHER): Payer: Self-pay | Admitting: Obstetrics and Gynecology

## 2023-03-05 DIAGNOSIS — Z01818 Encounter for other preprocedural examination: Secondary | ICD-10-CM | POA: Diagnosis not present

## 2023-03-05 NOTE — Progress Notes (Signed)
Spoke w/ via phone for pre-op interview---Shirley Fleming needs dos----   UPT      Fleming results------03/07/23 Fleming appt for cbc, type & screen COVID test -----patient states asymptomatic no test needed Arrive at -------0530 on Friday, 03/09/23 NPO after MN NO Solid Food.  Clear liquids from MN until---0430 Med rec completed Medications to take morning of surgery -----Albuterol inhaler prn (please bring), Xanax prn, Prozac, Flonase prn, norethindrone, Propranolol prn, Imitrex prn Diabetic medication -----n/a Patient instructed no nail polish to be worn day of surgery Patient instructed to bring photo id and insurance card day of surgery Patient aware to have Driver (ride ) / caregiver    for 24 hours after surgery - husband, Shirley Fleming Patient Special Instructions -----Extended / overnight stay instructions given.  Please bring inhaler on day of surgery. Pre-Op special Instructions -----none Patient verbalized understanding of instructions that were given at this phone interview. Patient denies chest pain, sob, fever, cough at the interview.

## 2023-03-05 NOTE — Progress Notes (Signed)
Your procedure is scheduled on Friday, 03/09/2023.  Report to [facility] AT  5:30 AM.   Call this number if you have problems the morning of surgery  [phone].   OUR ADDRESS IS [address].  WE ARE LOCATED IN [facility].  PLEASE BRING YOUR INSURANCE CARD AND PHOTO ID DAY OF SURGERY.  ONLY 2 PEOPLE ARE ALLOWED IN  WAITING  ROOM                                      REMEMBER:  DO NOT EAT FOOD, CANDY GUM OR MINTS  AFTER MIDNIGHT THE NIGHT BEFORE YOUR SURGERY . YOU MAY HAVE CLEAR LIQUIDS FROM MIDNIGHT THE NIGHT BEFORE YOUR SURGERY UNTIL  4:30 AM. NO CLEAR LIQUIDS AFTER   4:30 AM DAY OF SURGERY.  YOU MAY  BRUSH YOUR TEETH MORNING OF SURGERY AND RINSE YOUR MOUTH OUT, NO CHEWING GUM CANDY OR MINTS.     CLEAR LIQUID DIET    Allowed      Water                                                                   Coffee and tea, regular and decaf  (NO cream or milk products of any type, may sweeten)                         Carbonated beverages, regular and diet                                    Sports drinks like Gatorade _____________________________________________________________________     TAKE ONLY THESE MEDICATIONS MORNING OF SURGERY: Albuterol inhaler if needed (please bring with you), Xanax if needed, Prozac, Flonase if needed, norethindrone, Propranolol if needed, Imitrex if needed                                        DO NOT WEAR JEWERLY/  METAL/  PIERCINGS (INCLUDING NO PLASTIC PIERCINGS) DO NOT WEAR LOTIONS, POWDERS, PERFUMES OR NAIL POLISH ON YOUR FINGERNAILS. TOENAIL POLISH IS OK TO WEAR. DO NOT SHAVE FOR 48 HOURS PRIOR TO DAY OF SURGERY.  CONTACTS, GLASSES, OR DENTURES MAY NOT BE WORN TO SURGERY.  REMEMBER: NO SMOKING, VAPING ,  DRUGS OR ALCOHOL FOR 24 HOURS BEFORE YOUR SURGERY.                                     IS NOT RESPONSIBLE  FOR ANY BELONGINGS.                                                                     Marland Kitchen  [facility] - Preparing for Surgery Before surgery, you can play an important role.  Because skin is not sterile, your skin needs to be as free of germs as possible.  You can reduce the number of germs on your skin by washing with CHG (chlorahexidine gluconate) soap before surgery.  CHG is an antiseptic cleaner which kills germs and bonds with the skin to continue killing germs even after washing. Please DO NOT use if you have an allergy to CHG or antibacterial soaps.  If your skin becomes reddened/irritated stop using the CHG and inform your nurse when you arrive at Short Stay. Do not shave (including legs and underarms) for at least 48 hours prior to the first CHG shower.  You may shave your face/neck. Please follow these instructions carefully:  1.  Shower with CHG Soap the night before surgery and the  morning of Surgery.  2.  If you choose to wash your hair, wash your hair first as usual with your  normal  shampoo.  3.  After you shampoo, rinse your hair and body thoroughly to remove the  shampoo.                                        4.  Use CHG as you would any other liquid soap.  You can apply chg directly  to the skin and wash , chg soap provided, night before and morning of your surgery.  5.  Apply the CHG Soap to your body ONLY FROM THE NECK DOWN.   Do not use on face/ open                           Wound or open sores. Avoid contact with eyes, ears mouth and genitals (private parts).                       Wash face,  Genitals (private parts) with your normal soap.             6.  Wash thoroughly, paying special attention to the area where your surgery  will be performed.  7.  Thoroughly rinse your body with warm water from the neck down.  8.  DO NOT shower/wash with your normal soap after using and rinsing off  the CHG Soap.             9.  Pat yourself dry with a clean towel.            10.  Wear clean pajamas.            11.  Place clean sheets on your bed  the night of your first shower and do not  sleep with pets. Day of Surgery : Do not apply any lotions/ powders the morning of surgery.  Please wear clean clothes to the hospital/surgery center.  IF YOU HAVE ANY SKIN IRRITATION OR PROBLEMS WITH THE SURGICAL SOAP, PLEASE GET A BAR OF GOLD DIAL SOAP AND SHOWER THE NIGHT BEFORE YOUR SURGERY AND THE MORNING OF YOUR SURGERY. PLEASE LET THE NURSE KNOW MORNING OF YOUR SURGERY IF YOU HAD ANY PROBLEMS WITH THE SURGICAL SOAP.   YOUR SURGEON MAY HAVE REQUESTED EXTENDED RECOVERY TIME AFTER YOUR SURGERY. IT COULD BE A  JUST A FEW HOURS  UP TO AN OVERNIGHT STAY.  YOUR SURGEON SHOULD HAVE DISCUSSED  THIS WITH YOU PRIOR TO YOUR SURGERY. IN THE EVENT YOU NEED TO STAY OVERNIGHT PLEASE REFER TO THE FOLLOWING GUIDELINES. YOU MAY HAVE UP TO 4 VISITORS  MAY VISIT IN THE EXTENDED RECOVERY ROOM UNTIL 800 PM ONLY.  ONE  VISITOR AGE 67 AND OVER MAY SPEND THE NIGHT AND MUST BE IN EXTENDED RECOVERY ROOM NO LATER THAN 800 PM . YOUR DISCHARGE TIME AFTER YOU SPEND THE NIGHT IS 900 AM THE MORNING AFTER YOUR SURGERY. YOU MAY PACK A SMALL OVERNIGHT BAG WITH TOILETRIES FOR YOUR OVERNIGHT STAY IF YOU WISH.  REGARDLESS OF IF YOU STAY OVER NIGHT OR ARE DISCHARGED THE SAME DAY YOU WILL BE REQUIRED TO HAVE A RESPONSIBLE ADULT (18 YRS OLD OR OLDER) STAY WITH YOU FOR AT LEAST THE FIRST 24 HOURS  YOUR PRESCRIPTION MEDICATIONS WILL BE PROVIDED DURING YOUR HOSPITAL STAY.  ________________________________________________________________________                                                        QUESTIONS Mechele Claude PRE OP NURSE PHONE [phone].

## 2023-03-07 ENCOUNTER — Other Ambulatory Visit: Payer: Self-pay | Admitting: Physician Assistant

## 2023-03-07 ENCOUNTER — Encounter (HOSPITAL_COMMUNITY)
Admission: RE | Admit: 2023-03-07 | Discharge: 2023-03-07 | Disposition: A | Discharge status: Home / Self Care | Payer: BC Managed Care – PPO | Source: Ambulatory Visit | Attending: Obstetrics and Gynecology | Admitting: Obstetrics and Gynecology

## 2023-03-07 ENCOUNTER — Other Ambulatory Visit (HOSPITAL_BASED_OUTPATIENT_CLINIC_OR_DEPARTMENT_OTHER): Payer: Self-pay

## 2023-03-07 DIAGNOSIS — Z01818 Encounter for other preprocedural examination: Secondary | ICD-10-CM | POA: Insufficient documentation

## 2023-03-07 LAB — CBC
HCT: 44.1 % (ref 36.0–46.0)
Hemoglobin: 14.7 g/dL (ref 12.0–15.0)
MCH: 30.2 pg (ref 26.0–34.0)
MCHC: 33.3 g/dL (ref 30.0–36.0)
MCV: 90.7 fL (ref 80.0–100.0)
Platelets: 202 10*3/uL (ref 150–400)
RBC: 4.86 MIL/uL (ref 3.87–5.11)
RDW: 12 % (ref 11.5–15.5)
WBC: 7.6 10*3/uL (ref 4.0–10.5)
nRBC: 0 % (ref 0.0–0.2)

## 2023-03-07 MED ORDER — ALPRAZOLAM 0.25 MG PO TABS
0.2500 mg | ORAL_TABLET | Freq: Three times a day (TID) | ORAL | 2 refills | Status: AC | PRN
  Filled 2023-03-07: qty 30, 10d supply, fill #0

## 2023-03-07 MED ORDER — FLUOXETINE HCL 20 MG PO CAPS
20.0000 mg | ORAL_CAPSULE | Freq: Every day | ORAL | 1 refills | Status: DC
  Filled 2023-03-07: qty 30, 30d supply, fill #0
  Filled 2023-04-09: qty 30, 30d supply, fill #1
  Filled 2023-05-11: qty 30, 30d supply, fill #2
  Filled 2023-06-13: qty 30, 30d supply, fill #3

## 2023-03-07 NOTE — Telephone Encounter (Signed)
Pt requesting refill for Alprazolam 0.25 mg. Last OV 01/02/2023

## 2023-03-08 NOTE — Anesthesia Preprocedure Evaluation (Addendum)
Anesthesia Evaluation  Patient identified by MRN, date of birth, ID band Patient awake    Reviewed: Allergy & Precautions, NPO status , Patient's Chart, lab work & pertinent test results, reviewed documented beta blocker date and time   History of Anesthesia Complications Negative for: history of anesthetic complications  Airway Mallampati: II  TM Distance: >3 FB Neck ROM: Full    Dental  (+) Dental Advisory Given, Teeth Intact   Pulmonary asthma , former smoker   Pulmonary exam normal        Cardiovascular hypertension, Pt. on medications and Pt. on home beta blockers Normal cardiovascular exam     Neuro/Psych  Headaches PSYCHIATRIC DISORDERS Anxiety Depression     Empty sella     GI/Hepatic Neg liver ROS,GERD  Controlled and Medicated,,  Endo/Other  negative endocrine ROS   Obesity   Renal/GU negative Renal ROS     Musculoskeletal negative musculoskeletal ROS (+)    Abdominal   Peds  Hematology negative hematology ROS (+)   Anesthesia Other Findings   Reproductive/Obstetrics                             Anesthesia Physical Anesthesia Plan  ASA: 2  Anesthesia Plan: General   Post-op Pain Management: Tylenol PO (pre-op)* and Toradol IV (intra-op)*   Induction: Intravenous  PONV Risk Score and Plan: 3 and Treatment may vary due to age or medical condition, Ondansetron, Dexamethasone, Midazolam and Scopolamine patch - Pre-op  Airway Management Planned: Oral ETT  Additional Equipment: None  Intra-op Plan:   Post-operative Plan: Extubation in OR  Informed Consent: I have reviewed the patients History and Physical, chart, labs and discussed the procedure including the risks, benefits and alternatives for the proposed anesthesia with the patient or authorized representative who has indicated his/her understanding and acceptance.     Dental advisory given  Plan Discussed  with: CRNA and Anesthesiologist  Anesthesia Plan Comments:         Anesthesia Quick Evaluation

## 2023-03-09 ENCOUNTER — Other Ambulatory Visit: Payer: Self-pay

## 2023-03-09 ENCOUNTER — Encounter (HOSPITAL_BASED_OUTPATIENT_CLINIC_OR_DEPARTMENT_OTHER)
Admission: RE | Disposition: A | Discharge status: Home / Self Care | Payer: Self-pay | Source: Home / Self Care | Attending: Obstetrics and Gynecology

## 2023-03-09 ENCOUNTER — Ambulatory Visit (HOSPITAL_BASED_OUTPATIENT_CLINIC_OR_DEPARTMENT_OTHER)
Admission: RE | Admit: 2023-03-09 | Discharge: 2023-03-09 | Disposition: A | Discharge status: Home / Self Care | Payer: BC Managed Care – PPO | Attending: Obstetrics and Gynecology | Admitting: Obstetrics and Gynecology

## 2023-03-09 ENCOUNTER — Ambulatory Visit (HOSPITAL_BASED_OUTPATIENT_CLINIC_OR_DEPARTMENT_OTHER): Payer: BC Managed Care – PPO | Admitting: Anesthesiology

## 2023-03-09 ENCOUNTER — Encounter (HOSPITAL_BASED_OUTPATIENT_CLINIC_OR_DEPARTMENT_OTHER): Payer: Self-pay | Admitting: Obstetrics and Gynecology

## 2023-03-09 DIAGNOSIS — D250 Submucous leiomyoma of uterus: Secondary | ICD-10-CM | POA: Insufficient documentation

## 2023-03-09 DIAGNOSIS — J45909 Unspecified asthma, uncomplicated: Secondary | ICD-10-CM | POA: Insufficient documentation

## 2023-03-09 DIAGNOSIS — N920 Excessive and frequent menstruation with regular cycle: Secondary | ICD-10-CM | POA: Diagnosis not present

## 2023-03-09 DIAGNOSIS — Z87891 Personal history of nicotine dependence: Secondary | ICD-10-CM | POA: Insufficient documentation

## 2023-03-09 DIAGNOSIS — Z8249 Family history of ischemic heart disease and other diseases of the circulatory system: Secondary | ICD-10-CM | POA: Diagnosis not present

## 2023-03-09 DIAGNOSIS — N8003 Adenomyosis of the uterus: Secondary | ICD-10-CM | POA: Insufficient documentation

## 2023-03-09 DIAGNOSIS — I10 Essential (primary) hypertension: Secondary | ICD-10-CM | POA: Insufficient documentation

## 2023-03-09 DIAGNOSIS — N946 Dysmenorrhea, unspecified: Secondary | ICD-10-CM | POA: Diagnosis not present

## 2023-03-09 DIAGNOSIS — D252 Subserosal leiomyoma of uterus: Secondary | ICD-10-CM | POA: Insufficient documentation

## 2023-03-09 DIAGNOSIS — E669 Obesity, unspecified: Secondary | ICD-10-CM | POA: Insufficient documentation

## 2023-03-09 DIAGNOSIS — D259 Leiomyoma of uterus, unspecified: Secondary | ICD-10-CM

## 2023-03-09 DIAGNOSIS — Z01818 Encounter for other preprocedural examination: Secondary | ICD-10-CM

## 2023-03-09 DIAGNOSIS — N924 Excessive bleeding in the premenopausal period: Secondary | ICD-10-CM

## 2023-03-09 DIAGNOSIS — Z6832 Body mass index (BMI) 32.0-32.9, adult: Secondary | ICD-10-CM | POA: Insufficient documentation

## 2023-03-09 DIAGNOSIS — N809 Endometriosis, unspecified: Secondary | ICD-10-CM | POA: Diagnosis not present

## 2023-03-09 DIAGNOSIS — D649 Anemia, unspecified: Secondary | ICD-10-CM | POA: Diagnosis not present

## 2023-03-09 DIAGNOSIS — F418 Other specified anxiety disorders: Secondary | ICD-10-CM | POA: Diagnosis not present

## 2023-03-09 DIAGNOSIS — N944 Primary dysmenorrhea: Secondary | ICD-10-CM

## 2023-03-09 DIAGNOSIS — D251 Intramural leiomyoma of uterus: Secondary | ICD-10-CM | POA: Insufficient documentation

## 2023-03-09 DIAGNOSIS — N888 Other specified noninflammatory disorders of cervix uteri: Secondary | ICD-10-CM | POA: Diagnosis not present

## 2023-03-09 HISTORY — PX: CYSTOSCOPY: SHX5120

## 2023-03-09 HISTORY — PX: ROBOTIC ASSISTED LAPAROSCOPIC HYSTERECTOMY AND SALPINGECTOMY: SHX6379

## 2023-03-09 HISTORY — DX: Pneumonia, unspecified organism: J18.9

## 2023-03-09 HISTORY — DX: Presence of spectacles and contact lenses: Z97.3

## 2023-03-09 HISTORY — DX: Palpitations: R00.2

## 2023-03-09 HISTORY — DX: Other disorders of pituitary gland: E23.6

## 2023-03-09 LAB — TYPE AND SCREEN
ABO/RH(D): O POS
Antibody Screen: NEGATIVE

## 2023-03-09 LAB — POCT PREGNANCY, URINE: Preg Test, Ur: NEGATIVE

## 2023-03-09 LAB — ABO/RH: ABO/RH(D): O POS

## 2023-03-09 SURGERY — XI ROBOTIC ASSISTED LAPAROSCOPIC HYSTERECTOMY AND SALPINGECTOMY
Anesthesia: General | Site: Bladder

## 2023-03-09 MED ORDER — AMISULPRIDE (ANTIEMETIC) 5 MG/2ML IV SOLN: 10.0000 mg | Freq: Once | INTRAVENOUS | Status: DC | PRN

## 2023-03-09 MED ORDER — SCOPOLAMINE 1 MG/3DAYS TD PT72
1.0000 | MEDICATED_PATCH | TRANSDERMAL | Status: DC
  Administered 2023-03-09: 1.5 mg via TRANSDERMAL

## 2023-03-09 MED ORDER — POVIDONE-IODINE 10 % EX SWAB: 2.0000 | Freq: Once | CUTANEOUS | Status: DC

## 2023-03-09 MED ORDER — FENTANYL CITRATE (PF) 100 MCG/2ML IJ SOLN
INTRAMUSCULAR | Status: DC | PRN
  Administered 2023-03-09 (×2): 25 ug via INTRAVENOUS
  Administered 2023-03-09: 50 ug via INTRAVENOUS

## 2023-03-09 MED ORDER — DEXMEDETOMIDINE HCL IN NACL 80 MCG/20ML IV SOLN
INTRAVENOUS | Status: DC | PRN
  Administered 2023-03-09: 4 ug via INTRAVENOUS
  Administered 2023-03-09: 8 ug via INTRAVENOUS
  Administered 2023-03-09: 4 ug via INTRAVENOUS

## 2023-03-09 MED ORDER — OXYCODONE HCL 5 MG PO TABS
5.0000 mg | ORAL_TABLET | Freq: Once | ORAL | Status: DC | PRN
Start: 2023-03-09 — End: 2023-03-09

## 2023-03-09 MED ORDER — GABAPENTIN 300 MG PO CAPS
ORAL_CAPSULE | ORAL | Status: AC
  Filled 2023-03-09: qty 1

## 2023-03-09 MED ORDER — GABAPENTIN 300 MG PO CAPS
300.0000 mg | ORAL_CAPSULE | ORAL | Status: AC
Start: 2023-03-09 — End: 2023-03-09
  Administered 2023-03-09: 300 mg via ORAL

## 2023-03-09 MED ORDER — ROCURONIUM BROMIDE 10 MG/ML (PF) SYRINGE
PREFILLED_SYRINGE | INTRAVENOUS | Status: AC
  Filled 2023-03-09: qty 10

## 2023-03-09 MED ORDER — SOD CITRATE-CITRIC ACID 500-334 MG/5ML PO SOLN: 30.0000 mL | ORAL | Status: DC

## 2023-03-09 MED ORDER — ACETAMINOPHEN 500 MG PO TABS
1000.0000 mg | ORAL_TABLET | ORAL | Status: AC
Start: 2023-03-09 — End: 2023-03-09
  Administered 2023-03-09: 1000 mg via ORAL

## 2023-03-09 MED ORDER — SODIUM CHLORIDE 0.9 % IV SOLN
INTRAVENOUS | Status: AC
  Filled 2023-03-09: qty 100

## 2023-03-09 MED ORDER — LIDOCAINE HCL (PF) 2 % IJ SOLN
INTRAMUSCULAR | Status: AC
  Filled 2023-03-09: qty 5

## 2023-03-09 MED ORDER — ACETAMINOPHEN 500 MG PO TABS
ORAL_TABLET | ORAL | Status: AC
  Filled 2023-03-09: qty 2

## 2023-03-09 MED ORDER — CEFOXITIN SODIUM 2 G IV SOLR
INTRAVENOUS | Status: AC
  Filled 2023-03-09: qty 2

## 2023-03-09 MED ORDER — ONDANSETRON HCL 4 MG/2ML IJ SOLN: 4.0000 mg | Freq: Four times a day (QID) | INTRAMUSCULAR | Status: DC | PRN

## 2023-03-09 MED ORDER — ONDANSETRON HCL 4 MG PO TABS: 4.0000 mg | ORAL_TABLET | Freq: Four times a day (QID) | ORAL | Status: DC | PRN

## 2023-03-09 MED ORDER — PANTOPRAZOLE SODIUM 40 MG IV SOLR: 40.0000 mg | Freq: Every day | INTRAVENOUS | Status: DC

## 2023-03-09 MED ORDER — OXYCODONE HCL 5 MG/5ML PO SOLN: 5.0000 mg | Freq: Once | ORAL | Status: DC | PRN

## 2023-03-09 MED ORDER — SUGAMMADEX SODIUM 200 MG/2ML IV SOLN
INTRAVENOUS | Status: DC | PRN
  Administered 2023-03-09: 200 mg via INTRAVENOUS

## 2023-03-09 MED ORDER — HYDROMORPHONE HCL 1 MG/ML IJ SOLN: 0.2000 mg | INTRAMUSCULAR | Status: DC | PRN

## 2023-03-09 MED ORDER — DEXAMETHASONE SODIUM PHOSPHATE 10 MG/ML IJ SOLN
INTRAMUSCULAR | Status: AC
  Filled 2023-03-09: qty 1

## 2023-03-09 MED ORDER — FENTANYL CITRATE (PF) 100 MCG/2ML IJ SOLN
25.0000 ug | INTRAMUSCULAR | Status: DC | PRN
  Administered 2023-03-09: 25 ug via INTRAVENOUS

## 2023-03-09 MED ORDER — SODIUM CHLORIDE 0.9 % IV SOLN: Freq: Once | INTRAVENOUS | Status: DC

## 2023-03-09 MED ORDER — IBUPROFEN 200 MG PO TABS: 600.0000 mg | ORAL_TABLET | Freq: Four times a day (QID) | ORAL | Status: DC

## 2023-03-09 MED ORDER — SODIUM CHLORIDE 0.9 % IV SOLN
INTRAVENOUS | Status: DC | PRN
  Administered 2023-03-09: 1000 mL

## 2023-03-09 MED ORDER — SODIUM CHLORIDE 0.9 % IV SOLN
2.0000 g | INTRAVENOUS | Status: AC
  Administered 2023-03-09: 2 g via INTRAVENOUS

## 2023-03-09 MED ORDER — ONDANSETRON HCL 4 MG/2ML IJ SOLN
INTRAMUSCULAR | Status: AC
  Filled 2023-03-09: qty 2

## 2023-03-09 MED ORDER — PROPOFOL 10 MG/ML IV BOLUS
INTRAVENOUS | Status: AC
  Filled 2023-03-09: qty 20

## 2023-03-09 MED ORDER — STERILE WATER FOR IRRIGATION IR SOLN
Status: DC | PRN
  Administered 2023-03-09: 500 mL

## 2023-03-09 MED ORDER — FENTANYL CITRATE (PF) 100 MCG/2ML IJ SOLN
INTRAMUSCULAR | Status: AC
  Filled 2023-03-09: qty 2

## 2023-03-09 MED ORDER — GABAPENTIN 100 MG PO CAPS: 100.0000 mg | ORAL_CAPSULE | Freq: Once | ORAL | Status: DC

## 2023-03-09 MED ORDER — MIDAZOLAM HCL 5 MG/5ML IJ SOLN
INTRAMUSCULAR | Status: DC | PRN
  Administered 2023-03-09: 2 mg via INTRAVENOUS

## 2023-03-09 MED ORDER — DEXMEDETOMIDINE HCL IN NACL 80 MCG/20ML IV SOLN
INTRAVENOUS | Status: AC
  Filled 2023-03-09: qty 20

## 2023-03-09 MED ORDER — SODIUM CHLORIDE (PF) 0.9 % IJ SOLN
INTRAMUSCULAR | Status: AC
  Filled 2023-03-09: qty 20

## 2023-03-09 MED ORDER — OXYCODONE HCL 5 MG PO TABS: 5.0000 mg | ORAL_TABLET | ORAL | Status: DC | PRN

## 2023-03-09 MED ORDER — ACETAMINOPHEN 325 MG PO TABS: 650.0000 mg | ORAL_TABLET | ORAL | Status: DC | PRN

## 2023-03-09 MED ORDER — SCOPOLAMINE 1 MG/3DAYS TD PT72
MEDICATED_PATCH | TRANSDERMAL | Status: AC
  Filled 2023-03-09: qty 1

## 2023-03-09 MED ORDER — ONDANSETRON HCL 4 MG/2ML IJ SOLN
INTRAMUSCULAR | Status: DC | PRN
  Administered 2023-03-09: 4 mg via INTRAVENOUS

## 2023-03-09 MED ORDER — BUPIVACAINE HCL (PF) 0.5 % IJ SOLN
INTRAMUSCULAR | Status: DC | PRN
  Administered 2023-03-09: 19 mL

## 2023-03-09 MED ORDER — ROCURONIUM BROMIDE 10 MG/ML (PF) SYRINGE
PREFILLED_SYRINGE | INTRAVENOUS | Status: DC | PRN
  Administered 2023-03-09: 70 mg via INTRAVENOUS

## 2023-03-09 MED ORDER — PROPOFOL 10 MG/ML IV BOLUS
INTRAVENOUS | Status: DC | PRN
  Administered 2023-03-09: 180 mg via INTRAVENOUS

## 2023-03-09 MED ORDER — MIDAZOLAM HCL 2 MG/2ML IJ SOLN
INTRAMUSCULAR | Status: AC
  Filled 2023-03-09: qty 2

## 2023-03-09 MED ORDER — DEXAMETHASONE SODIUM PHOSPHATE 10 MG/ML IJ SOLN
INTRAMUSCULAR | Status: DC | PRN
  Administered 2023-03-09: 10 mg via INTRAVENOUS

## 2023-03-09 MED ORDER — LACTATED RINGERS IV SOLN
INTRAVENOUS | Status: AC
Start: 2023-03-09 — End: 2023-03-09

## 2023-03-09 MED ORDER — EPHEDRINE SULFATE-NACL 50-0.9 MG/10ML-% IV SOSY
PREFILLED_SYRINGE | INTRAVENOUS | Status: DC | PRN
  Administered 2023-03-09 (×2): 5 mg via INTRAVENOUS

## 2023-03-09 MED ORDER — LACTATED RINGERS IV SOLN: INTRAVENOUS | Status: DC

## 2023-03-09 MED ORDER — SODIUM CHLORIDE 0.9 % IV SOLN
Freq: Once | INTRAVENOUS | Status: DC
  Filled 2023-03-09 (×2): qty 10

## 2023-03-09 MED ORDER — SODIUM CHLORIDE 0.9 % IV SOLN: 12.5000 mg | INTRAVENOUS | Status: DC | PRN

## 2023-03-09 MED ORDER — LIDOCAINE 2% (20 MG/ML) 5 ML SYRINGE
INTRAMUSCULAR | Status: DC | PRN
  Administered 2023-03-09: 80 mg via INTRAVENOUS

## 2023-03-09 SURGICAL SUPPLY — 56 items
APPLICATOR ARISTA FLEXITIP XL (MISCELLANEOUS) IMPLANT
BARRIER ADHS 3X4 INTERCEED (GAUZE/BANDAGES/DRESSINGS) IMPLANT
CATH FOLEY 3WAY 5CC 16FR (CATHETERS) ×2 IMPLANT
COVER BACK TABLE 60X90IN (DRAPES) ×2 IMPLANT
COVER TIP SHEARS 8 DVNC (MISCELLANEOUS) ×2 IMPLANT
DEFOGGER SCOPE WARMER CLEARIFY (MISCELLANEOUS) ×2 IMPLANT
DERMABOND ADVANCED .7 DNX12 (GAUZE/BANDAGES/DRESSINGS) ×2 IMPLANT
DRAPE ARM DVNC X/XI (DISPOSABLE) ×8 IMPLANT
DRAPE COLUMN DVNC XI (DISPOSABLE) ×2 IMPLANT
DRAPE UTILITY XL STRL (DRAPES) ×2 IMPLANT
DRIVER NDL MEGA SUTCUT DVNCXI (INSTRUMENTS) IMPLANT
DRIVER NDLE MEGA SUTCUT DVNCXI (INSTRUMENTS) ×2
DURAPREP 26ML APPLICATOR (WOUND CARE) ×2 IMPLANT
ELECT REM PT RETURN 9FT ADLT (ELECTROSURGICAL) ×2
ELECTRODE REM PT RTRN 9FT ADLT (ELECTROSURGICAL) ×2 IMPLANT
FORCEPS PROGRASP DVNC XI (FORCEP) IMPLANT
GAUZE 4X4 16PLY ~~LOC~~+RFID DBL (SPONGE) IMPLANT
GLOVE NEODERM STER SZ 7 (GLOVE) ×6 IMPLANT
GYRUS RUMI II 2.5CM BLUE (DISPOSABLE)
GYRUS RUMI II 3.5CM BLUE (DISPOSABLE)
GYRUS RUMI II 4.0CM BLUE (DISPOSABLE)
HEMOSTAT ARISTA ABSORB 3G PWDR (HEMOSTASIS) IMPLANT
HOLDER FOLEY CATH W/STRAP (MISCELLANEOUS) IMPLANT
IRRIG SUCT STRYKERFLOW 2 WTIP (MISCELLANEOUS) ×2
IRRIGATION SUCT STRKRFLW 2 WTP (MISCELLANEOUS) ×2 IMPLANT
KIT PINK PAD W/HEAD ARE REST (MISCELLANEOUS) ×2
KIT PINK PAD W/HEAD ARM REST (MISCELLANEOUS) ×2 IMPLANT
KIT TURNOVER CYSTO (KITS) ×2 IMPLANT
LEGGING LITHOTOMY PAIR STRL (DRAPES) ×2 IMPLANT
MANIFOLD NEPTUNE II (INSTRUMENTS) ×2 IMPLANT
OBTURATOR OPTICAL STND 8 DVNC (TROCAR) ×2
OBTURATOR OPTICALSTD 8 DVNC (TROCAR) ×2 IMPLANT
PACK ROBOT WH (CUSTOM PROCEDURE TRAY) ×2 IMPLANT
PACK ROBOTIC GOWN (GOWN DISPOSABLE) ×2 IMPLANT
PAD OB MATERNITY 4.3X12.25 (PERSONAL CARE ITEMS) ×2 IMPLANT
RUMI II 3.0CM BLUE KOH-EFFICIE (DISPOSABLE) IMPLANT
RUMI II GYRUS 2.5CM BLUE (DISPOSABLE) IMPLANT
RUMI II GYRUS 3.5CM BLUE (DISPOSABLE) IMPLANT
RUMI II GYRUS 4.0CM BLUE (DISPOSABLE) IMPLANT
SCISSORS MNPLR CVD DVNC XI (INSTRUMENTS) IMPLANT
SCOPETTES 8 STERILE (MISCELLANEOUS) IMPLANT
SEAL UNIV 5-12 XI (MISCELLANEOUS) ×6 IMPLANT
SEALER VESSEL EXT DVNC XI (MISCELLANEOUS) IMPLANT
SET IRRIG Y TYPE TUR BLADDER L (SET/KITS/TRAYS/PACK) ×2 IMPLANT
SET TUBE SMOKE EVAC HIGH FLOW (TUBING) ×2 IMPLANT
SPIKE FLUID TRANSFER (MISCELLANEOUS) ×2 IMPLANT
SUT MNCRL AB 4-0 PS2 18 (SUTURE) ×2 IMPLANT
SUT VLOC 180 0 9IN GS21 (SUTURE) ×2 IMPLANT
TIP RUMI ORANGE 6.7MMX12CM (TIP) IMPLANT
TIP UTERINE 5.1X6CM LAV DISP (MISCELLANEOUS) IMPLANT
TIP UTERINE 6.7X10CM GRN DISP (MISCELLANEOUS) IMPLANT
TIP UTERINE 6.7X6CM WHT DISP (MISCELLANEOUS) IMPLANT
TIP UTERINE 6.7X8CM BLUE DISP (MISCELLANEOUS) IMPLANT
TOWEL OR 17X24 6PK STRL BLUE (TOWEL DISPOSABLE) ×2 IMPLANT
UNDERPAD 30X36 HEAVY ABSORB (UNDERPADS AND DIAPERS) ×2 IMPLANT
WATER STERILE IRR 500ML POUR (IV SOLUTION) ×2 IMPLANT

## 2023-03-09 NOTE — Anesthesia Procedure Notes (Signed)
Procedure Name: Intubation Date/Time: 03/09/2023 7:42 AM  Performed by: Taylah Dubiel D, CRNAPre-anesthesia Checklist: Patient identified, Emergency Drugs available, Suction available and Patient being monitored Patient Re-evaluated:Patient Re-evaluated prior to induction Oxygen Delivery Method: Circle system utilized Preoxygenation: Pre-oxygenation with 100% oxygen Induction Type: IV induction Ventilation: Mask ventilation without difficulty Grade View: Grade I Tube type: Oral Tube size: 7.0 mm Number of attempts: 1 Airway Equipment and Method: Stylet and Oral airway Placement Confirmation: ETT inserted through vocal cords under direct vision, positive ETCO2 and breath sounds checked- equal and bilateral Secured at: 21 cm Tube secured with: Tape Dental Injury: Teeth and Oropharynx as per pre-operative assessment

## 2023-03-09 NOTE — Progress Notes (Signed)
NEUROLOGY FOLLOW UP OFFICE NOTE  Shirley Fleming [id number]  Subjective:  Shirley Fleming is a 47 y.o. year old right-handed female with a medical history of HTN, migraine, asthma, depression who we last saw on 09/13/22 for migraines.  To briefly review: Initial consult (04/05/22): Patient has pain in and around right ear and along jaw when chewing that started 01/23/22 while traveling. Her symptoms have mostly improved. She had a work up included a maxillofacial CT which showed an incidental partially empty sella. Given her pressure headaches and potential concern for IIH, patient was referred to neurology.   Patient has seen ENT. He thinks patient has inner ear problems (?pressure).   In terms of headaches, she has a history of migraines. She had sinus headaches as a child. She started getting migraines after having her son. She has pressure in her head, usually one sided, that will switch after a 18 hours and continue for 18 hours on the other side. She endorses photophobia, phonophobia, nausea. She also is sensitive to smells. She would normally get the headaches with her cycle. Of late, it has been more random, maybe none for 2 months, then maybe 2 per month. They typically last from 1-3 days. She can have head pain. She can take ibuprofen that sometimes helps. She has never tried a migraine medication. She will lay down in a dark room and lay on the side of the headache for help. Her husband may also rub her neck and give some relief. She can wake up with headaches. Getting up and moving can help. Flonase can also help.   She endorses vision changes for about 1 year ago. She had COVID around that time. She has noticed her vision is worse. She will feel like sometimes one eye won't see correctly. She will blink and wait and things will get better. She was seen by her eye doctor who dilated her eyes and said that looked okay, but that she needed new prescription. She feels like things are not as  bright as usual as well, but can be too bright when having a migraine.   Patient is on B-complex and multivitamin. She is on propranolol as needed due to occasional tachycardia after COVID. She takes Prozac for depression and xanax as needed.   EtOH: 1 glass of wine at night Caffeine: 2-3 glasses of coffee per day    06/07/22: Labs were significant for elevated B vitamins including B6. I recommended she stop any supplements, which she did.    MRI brain on 05/07/21 again showed a partially empty sella but was otherwise remarkable. She had a lumbar puncture on 05/24/22 with an opening pressure of 17 cm of water. Routine analysis was normal. She was headache free for a few days. The headaches returned but did not have a positional component to them. She thinks the vision might have been a little better for a few days as well.   She had low back pain and nausea for a couple of weeks after LP.   Since 03/2022 visit, patient has had 3-4 headaches. They tend to cluster on one side of the face at a time, but can be either side. She has nausea, photophobia, and phonophobia. She is taking propranolol, but this has not helped with headaches. She will take ibuprofen for headaches, which sometimes helps.   She does not have a history of kidney stones.   07/25/22: Patient was not able to tolerate Topamax (feeling off, body aches, URI like symptoms). She  weaned off starting 07/20/22. She stated her body temp was 95.80F despite everyone else in her family being normal. The next day after stopping, her symptoms went away. She was also very fatigued, which improved after stopping the medication.    Topamax did help with headaches. She had one day of non-debilitating headache while on it. She had improvement of neck pain while on Topamax as well.   She has "wave like feeling" of a headache coming on, but then going away. It is not with changing position though. Laying down does not change symptoms. She had one day of  aura with visual spots. She usually has headaches that cluster, particularly around her menstrual cycle.    Of note, patient currently takes Prozac 20 mg daily for depression and propranolol 10 mg daily for racing heart.   She did PT for her neck in the past, but many years ago.  09/13/22: Since last visit, patient has had 2 headaches. She had one really bad headache and took Imitrex, but the headache did not respond. The headache lasted 24-48 hours. She had another headache while out of town that she managed with ibuprofen. She is still more likely to get a headache around her cycle.   She continues to have neck pain as well.  Most recent Assessment and Plan (09/13/22): This is Shirley Fleming, a 47 y.o. female with episodic migraine with aura - initial concern for positional component of HA so IIH was concerned. LP showed normal opening pressure. She also has significant neck pain that contributes to headaches. Patient could not tolerate Topamax, so this was weaned off ~07/20/22. Patient currently on propranolol 10 mg daily. She has noticed taking extra propranolol has helped headaches in the past. She has taken Sumatriptan once for severe headache, but did not notice it helping.   Plan: Migraine prevention:  Take extra propranolol the week prior to menstrual cycle (takes 10 mg daily now, will increase to 10 mg twice daily the week before and week of cycle) Migraine rescue:  Sumatriptan 100 mg as needed at headache onset and can repeat after 2 hours if needed Limit use of pain relievers to no more than 2 days out of week to prevent risk of rebound or medication-overuse headache. Keep headache diary Discussed PT for neck pain/tightness. Patient would like to defer for now.  Since their last visit: Patient has had some additional improvement with increasing propranolol to 10 mg BID. She has been taking ibuprofen for headaches, which has been working. She has not taken Sumatriptan. She is currently  getting about 1 headache per month that lasts a day or two. She thinks headaches are milder when she does get one. She has had 2 bad headaches. One was while prepping for colonoscopy and one was at altitude when in [place]. She continues to have some neck pain and headaches and noticed with one headache that an ice pack on her neck help a lot.   Patient had a hysterectomy on 03/09/23, so does not know how this will affect her headaches.  Current medications: Propranolol 10 mg BID, has sumatriptan but using ibuprofen Side effects: None  MEDICATIONS:  Outpatient Encounter Medications as of 03/16/2023  Medication Sig   albuterol (VENTOLIN HFA) 108 (90 Base) MCG/ACT inhaler Inhale 1-2 puffs into the lungs every 6 (six) hours as needed.   ALPRAZolam (XANAX) 0.25 MG tablet Take 1 tablet (0.25 mg total) by mouth every 8 (eight) hours as needed.   cholecalciferol (VITAMIN D3) 25 MCG (  1000 UT) tablet Take 1,000 Units by mouth daily.   ferrous sulfate 324 MG TBEC Take 65 mg by mouth daily with breakfast.   FLUoxetine (PROZAC) 20 MG capsule Take 1 capsule (20 mg total) by mouth daily.   fluticasone (FLONASE) 50 MCG/ACT nasal spray Place 1 spray into both nostrils as needed.   ibuprofen (ADVIL) 800 MG tablet Take 1 tablet (800 mg total) by mouth every 8 (eight) hours as needed.   Magnesium 250 MG TABS Take 250 mg by mouth at bedtime.   metoCLOPramide (REGLAN) 10 MG tablet Take 1 tablet (10 mg total) by mouth every 6 (six) hours as needed for nausea.   Multiple Vitamin (MULTIVITAMIN) tablet Take 1 tablet by mouth daily.   norethindrone (INCASSIA) 0.35 MG tablet Take 1 tablet (0.35 mg total) by mouth daily.   oxyCODONE (ROXICODONE) 5 MG immediate release tablet Take 1 tablet (5 mg total) by mouth every 4 (four) hours as needed for severe pain (pain score 7-10).   propranolol (INDERAL) 10 MG tablet Take 1 tablet (10 mg total) by mouth every 12 (twelve) hours as needed.   Saline (SIMPLY SALINE) 0.9 % AERS Place  2 each into the nose as directed. Use nightly for sinus hygiene long-term.  Can also be used as many times daily as desired to assist with clearing congested sinuses.   SUMAtriptan (IMITREX) 100 MG tablet Take 1 tablet (100 mg total) by mouth once as needed for up to 1 dose for migraine. May repeat in 2 hours if headache persists or recurs.   UNABLE TO FIND Med Name: fiber gummy takes 2   [EXPIRED] cefOXitin (MEFOXIN) 2 g in sodium chloride 0.9 % 100 mL IVPB    [DISCONTINUED] cefazolin 1000 mg in normal saline 1000 mL irrigation    [DISCONTINUED] dexmedetomidine (PRECEDEX) 80 MCG/20ML    [DISCONTINUED] fentaNYL (SUBLIMAZE) injection    [DISCONTINUED] lactated ringers infusion    [DISCONTINUED] midazolam (VERSED) 5 MG/5ML injection    [DISCONTINUED] povidone-iodine 10 % swab 2 Application    [DISCONTINUED] scopolamine (TRANSDERM-SCOP) 1 MG/3DAYS 1.5 mg    No facility-administered encounter medications on file as of 03/16/2023.    PAST MEDICAL HISTORY: Past Medical History:  Diagnosis Date   Anemia    takes iron- heavy menstral cycle   Anxiety    on meds   Asthma    uses inhaler with exercise; Follows w/ PCP.   Depression 1995   Off and on since teens   Empty sella (HCC)    Partially empty sella per MR brain 05/07/22, Follows w/ neurololgy.   GERD (gastroesophageal reflux disease)    OTC PRN meds   History of COVID-19 12/23/2020   Hypertension Jan 2021   resolved per pt, no meds   Hypoglycemia    Inflamed seborrheic keratosis 2024   Follows w/ [facility].   Migraine with aura    Follows w/ Dr. Jacquelyne Balint, neurologist. See 05/07/22 MRI of brain in Epic.   Palpitations    after having Covid, takes Propranolol prn, saw cardiologist, Dr. Rollene Rotunda, 06/17/20 Long term monitor in Epic   Pneumonia    mother was a smoker, pt states growing up she had pneumonia and bronchitis several times   Seasonal allergies    Wears glasses    for driving only    PAST SURGICAL  HISTORY: Past Surgical History:  Procedure Laterality Date   APPENDECTOMY  2003   COLONOSCOPY  02/09/2023   1 - 4mm polyp  CYSTOSCOPY N/A 03/09/2023   Procedure: CYSTOSCOPY;  Surgeon: Earley Favor, MD;  Location: [facility];  Service: Gynecology;  Laterality: N/A;   ROBOTIC ASSISTED LAPAROSCOPIC HYSTERECTOMY AND SALPINGECTOMY Bilateral 03/09/2023   Procedure: XI ROBOTIC ASSISTED LAPAROSCOPIC HYSTERECTOMY AND SALPINGECTOMY;  Surgeon: Earley Favor, MD;  Location: [facility];  Service: Gynecology;  Laterality: Bilateral;   TONSILLECTOMY AND ADENOIDECTOMY  1993    ALLERGIES: Allergies  Allergen Reactions   Doxycycline Nausea Only   Erythromycin Nausea And Vomiting    FAMILY HISTORY: Family History  Problem Relation Age of Onset   Irritable bowel syndrome Mother    COPD Mother    Depression Mother    Heart attack Father 44       x 2   Hypertension Father    Heart disease Father    Hypothyroidism Brother    Other Brother        sepsis from knee surgery   Breast cancer Maternal Aunt        dx mid 32s   Diabetes Maternal Aunt    Hypertension Maternal Aunt    Cancer Maternal Aunt    Depression Maternal Aunt    Hypertension Maternal Aunt    Breast cancer Maternal Grandmother        dx 30s, d. 30s   Cancer Maternal Grandmother    Heart attack Maternal Grandfather    Heart disease Maternal Grandfather    Breast cancer Paternal Grandmother        dx 48s; + vaginal cancer 60s, jaw cancer 59s   Cancer Paternal Grandmother    Heart attack Paternal Grandfather    OCD Daughter    Anxiety disorder Daughter    ADD / ADHD Son    Other Son        hypermobile EDS   Autism Son    Colon polyps Neg Hx    Colon cancer Neg Hx    Esophageal cancer Neg Hx    Stomach cancer Neg Hx    Rectal cancer Neg Hx     SOCIAL HISTORY: Social History   Tobacco Use   Smoking status: Former    Current packs/day: 0.00    Types: Cigarettes     Start date: 04/24/1990    Quit date: 04/24/2005    Years since quitting: 17.9    Passive exposure: Past   Smokeless tobacco: Never  Vaping Use   Vaping status: Never Used  Substance Use Topics   Alcohol use: Yes    Alcohol/week: 2.0 standard drinks of alcohol    Types: 2 Glasses of wine per week   Drug use: Never   Social History   Social History Narrative   Married   Two children    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 Right   Are you currently employed ? yes   What is your current occupation? home   Do you live at home alone? Husband and kids and Dad   Who lives with you?    What type of home do you live in: 1 story or 2 story? two    Caffeine 2-3 day      Objective:  Vital Signs:  BP 98/66   Pulse 76   Ht 5' 5.5" (1.664 m)   Wt 202 lb (91.6 kg)   LMP 02/11/2023 (Exact Date)   SpO2 94%   BMI 33.10 kg/m  General: No acute distress.  Patient appears well-groomed.   Head:  Normocephalic/atraumatic Eyes:  fundi examined, disc margins clear, no obvious papilledema Neck: supple, Positive for paraspinal tightness, Reduced range of motion particularly when turning head to right Lungs: Non-labored breathing on room air   Neurological Exam: Mental status: alert and oriented, speech fluent and not dysarthric, language intact.  Cranial nerves: CN I: not tested CN II: pupils equal, round and reactive to light, visual fields intact CN III, IV, VI:  full range of motion, no nystagmus, no ptosis CN V: facial sensation intact. CN VII: upper and lower face symmetric CN VIII: hearing intact CN IX, X: uvula midline CN XI: sternocleidomastoid and trapezius muscles intact CN XII: tongue midline  Bulk & Tone: normal, no fasciculations. Motor:  muscle strength 5/5 throughout Deep Tendon Reflexes:  2+ throughout.   Sensation:  Light sensation intact. Finger to nose testing:  Without dysmetria.   Gait:   Normal station and stride.   Labs and Imaging review: New results: 03/07/23: CBC unremarkable  01/02/23: CMP unremarkable Lipid panel: tChol 189, LDL 110, TG 135  Previously reviewed results: 04/05/22: B6: 36.7 B1: 38 B12: 1005 CSF: 0 R, 1 W, 37 P, 66 G; opening pressure 17 cm of water            Lab Results  Component Value Date    TSH 2.03 08/05/2021       CBC and CMP wnl on 11/29/21 Vit D wnl on 05/24/20   CT maxillofacial w contrast (01/27/22): FINDINGS: Osseous: No acute fracture or aggressive osseous lesion identified.   Orbits: No orbital mass or acute orbital finding.   Sinuses: No significant paranasal sinus disease.   Soft tissues: Asymmetric soft tissue thickening and enhancement at the lateral aspect of the right external auditory canal.   Limited intracranial: No evidence of acute intracranial abnormality within the field of view. Partially empty sella turcica.   Other: The middle ear cavities appear normally aerated. No significant mastoid effusion. Streak and beam hardening artifact arising from dental restoration partially obscures the oral cavity.   IMPRESSION: Asymmetric soft tissue thickening and enhancement at the lateral aspect of the right external auditory canal, nonspecific but suspicious for acute otitis externa given the provided history. Direct visualization is recommended.   Partially empty sella turcica. While this finding often reflects incidental anatomic variation, it can also be associated with idiopathic intracranial hypertension (pseudotumor cerebri).   MRI brain wo contrast (05/07/22): FINDINGS: Brain:   No age advanced or lobar predominant parenchymal atrophy.   Partially empty sella turcica.   No cortical encephalomalacia is identified. No significant cerebral white matter disease.   There is no acute infarct.   No evidence of an intracranial mass.   No chronic intracranial blood products.   No extra-axial fluid  collection.   No midline shift.   Vascular: Maintained flow voids within the proximal large arterial vessels.   Skull and upper cervical spine: No focal suspicious marrow lesion.   Sinuses/Orbits: No mass or acute finding within the imaged orbits. No significant paranasal sinus disease.   IMPRESSION: 1. No evidence of an intracranial mass or acute infarct. 2. Partially empty sella turcica. This finding can reflect incidental anatomic variation, or alternatively, it can be associated with idiopathic intracranial hypertension (pseudotumor cerebri). 3. Otherwise unremarkable non-contrast MRI appearance of the brain.  Assessment/Plan:  This is Shirley Fleming, a 47 y.o. female with episodic migraine with aura - initial concern for positional component of HA  so IIH was concerned. LP showed normal opening pressure. She also has significant neck pain that contributes to headaches. Patient could not tolerate Topamax, so this was weaned off ~07/20/22. Patient currently on propranolol 10 mg BID having about 1 headache per month around her menstrual cycle. She had a hysterectomy on 03/09/23.   Plan: For migraines: Migraine prevention:  Continue propranolol 10 mg BID Migraine rescue:  Continue sumatriptan 100 mg PRN or ibuprofen PRN Limit use of pain relievers to no more than 2 days out of week to prevent risk of rebound or medication-overuse headache. Keep headache diary  PT for neck pain  Return to clinic in 6 months   Jacquelyne Balint, MD

## 2023-03-09 NOTE — Interval H&P Note (Signed)
History and Physical Interval Note:  03/09/2023 7:19 AM  Shirley Fleming  has presented today for surgery, with the diagnosis of menorrhagia, dysmenorrhea, adenomyosis.  The various methods of treatment have been discussed with the patient and family. After consideration of risks, benefits and other options for treatment, the patient has consented to  Procedure(s): XI ROBOTIC ASSISTED LAPAROSCOPIC HYSTERECTOMY AND SALPINGECTOMY (Bilateral) CYSTOSCOPY (N/A) as a surgical intervention.  The patient's history has been reviewed, patient examined, no change in status, stable for surgery.  I have reviewed the patient's chart and labs.  Questions were answered to the patient's satisfaction.     Earley Favor

## 2023-03-09 NOTE — Brief Op Note (Signed)
03/09/2023  8:46 AM  PATIENT:  Shirley Fleming  47 y.o. female  PRE-OPERATIVE DIAGNOSIS:  menorrhagia, dysmenorrhea, fibroids  POST-OPERATIVE DIAGNOSIS:  menorrhagia, dysmenorrhea,fibroids, endometriosis  PROCEDURE:  Procedure(s): XI ROBOTIC ASSISTED LAPAROSCOPIC HYSTERECTOMY AND SALPINGECTOMY (Bilateral) CYSTOSCOPY (N/A)  SURGEON:  Surgeons and Role:    * Earley Favor, MD - Primary  PHYSICIAN ASSISTANT:   ASSISTANTS: Donne Hazel, RN   ANESTHESIA:   general  EBL:  minimal   BLOOD ADMINISTERED:none  DRAINS: none   LOCAL MEDICATIONS USED:  MARCAINE     SPECIMEN:  Source of Specimen:  uterus, tubes and cervix  DISPOSITION OF SPECIMEN:  PATHOLOGY  COUNTS:  YES  TOURNIQUET:  * No tourniquets in log *  DICTATION: .Note written in EPIC  PLAN OF CARE: Discharge to home after PACU  PATIENT DISPOSITION:  PACU - hemodynamically stable.   Delay start of Pharmacological VTE agent (>24hrs) due to surgical blood loss or risk of bleeding: no

## 2023-03-09 NOTE — Anesthesia Postprocedure Evaluation (Signed)
Anesthesia Post Note  Patient: Jamisa Stanwood  Procedure(s) Performed: XI ROBOTIC ASSISTED LAPAROSCOPIC HYSTERECTOMY AND SALPINGECTOMY (Bilateral: Abdomen) CYSTOSCOPY (Bladder)     Patient location during evaluation: PACU Anesthesia Type: General Level of consciousness: awake and alert Pain management: pain level controlled Vital Signs Assessment: post-procedure vital signs reviewed and stable Respiratory status: spontaneous breathing, nonlabored ventilation and respiratory function stable Cardiovascular status: stable and blood pressure returned to baseline Anesthetic complications: no   No notable events documented.  Last Vitals:  Vitals:   03/09/23 0930 03/09/23 1000  BP: 114/78 109/77  Pulse: (!) 55 (!) 56  Resp: 16 14  Temp:    SpO2: 98% 97%    Last Pain:  Vitals:   03/09/23 1000  TempSrc:   PainSc: 5                  Beryle Lathe

## 2023-03-09 NOTE — Op Note (Signed)
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 Allayne Butcher Assistant: Donne Hazel, RN   Fluids: please see anesthesia report   Complications: None Anesthesia: General     Findings:  boggy contour 10cm uterus, normal ovaries and tubes.  Stage 1-2 endometriosis seen on the posterior aspect of the uterus and tubes.  Cystoscopy at the end of the case with normal bladder and patent ureters bilaterally.   Estimated blood loss: Minimal <15cc   Specimens: Uterus, cervix and bilateral tubes   Disposition of specimen: Pathology           Patient is taken to the operating room. She is placed in the supine position. She is a running IV in place. Informed consent was present on the chart. SCDs on her lower extremities and functioning properly. Patient was positioned while she was awake.  Her legs were placed in the low lithotomy position in [place] stirrups. Her arms were tucked by the side.  General endotracheal anesthesia was administered by the anesthesia staff without difficulty.       Chlora prep was then used to prep the abdomen and Hibiclens was used to prep the inner thighs, perineum and vagina. Once 3 minutes had past the patient was draped in a normal standard fashion. A proper time out was performed and everyone agreed.  The legs were lifted to the high lithotomy position. A bivalve speculum was inserted into the vagina and the anterior lip of the cervix was grasped with single-tooth tenaculum.  The uterus sounded to 10 cm. Pratt dilators were used to dilate the cervix.  The RUMI uterine manipulator was obtained inserted into the endometrial cavity and the bulb of the disposable tip was inflated with 8 cc of normal saline. There was a good fit of  the KOH ring around the cervix. The tenaculum and bivavle speculum was removed. There is also good manipulation of the uterus.  A Foley catheter was placed to straight drain.  Clear urine was noted. Legs were lowered to the low lithotomy position and attention was turned the abdomen.   Superior to the umbilicus, marcaine 0.25% used to anesthetize the skin.  Using #11 blade, 8mm skin incision was made.  The 8mm robotic trocar and sleeve was inserted under direct visualization.  CO2 gas was  started and patient was placed in trendelenburg position.  Two additional 8mm ports were placed under direct visualization in the left and right lower quadrant.     Ureters were identifies.  Attention was turned to the left side. The left tube was elevated and the mesosalpinx was desiccated with the vessel sealer.  The left uterine ovarian pedicle was serially clamped cauterized and incised. Left round ligament was serially clamped cauterized and incised. The anterior and posterior peritoneum of the inferior leaf of the broad ligament were opened. The beginning of the bladder flap was created.  The bladder was taken down below the level of the KOH ring. The left uterine artery skeletonized and then just superior to the KOH ring this vessel was serially clamped, cauterized, and incised.   Attention was turned the right side.  The uterus was placed on stretch to the opposite side.    The mesosalpinx was incised freeing the tube. Then the right uterine ovarian pedicle was serially clamped cauterized and  incised. Next the right round ligament was serially clamped cauterized and incised. The anterior posterior peritoneum of the inferiorly for the broad ligament were opened. The anterior peritoneum was carried across to the dissection on the left side. The remainder of the bladder flap was created using sharp dissection. The bladder was well below the level of the KOH ring. The right uterine artery skeletonized. Then the right  uterine artery, above the level of the KOH ring, was serially clamped cauterized and incised. The uterus was devascularized at this point.   The colpotomy was performed.  This was carried around a circumferential fashion until the vaginal mucosa was completely incised in the specimen was freed.  The specimen was then delivered to the vagina intact.  A vaginal occlusive device was used to maintain the pneumoperitoneum   Instruments were changed with a needle driver and prograsp.  Using a 9 inch  zero V-lock suture, the cuff was closed by incorporating the anterior and posterior vaginal mucosa in each stitch. This was carried across all the way to the left corner and a running fashion. Two stitches were brought back towards the midline and the suture was cut flush with the vagina. The needle was brought out the pelvis. The pelvis was irrigated. All pedicles were inspected. No bleeding was noted.   Co2 pressures were lowered to 8mm Hg.  Again, no bleeding was noted.  Ureters were noted deep in the pelvis to be peristalsing.  At this point the procedure was completed.  The remaining instruments were removed.  The ports were removed under direct visualization of the laparoscope and the pneumoperitoneum was relieved.   The skin was then closed with subcuticular stitches of 3-0 Vicryl. The skin was cleansed Dermabond was applied. Attention was then turned the vagina and the cuff was inspected. No bleeding was noted.  The Foley catheter was removed.  Cystoscopy was performed.  No sutures or bladder injuries were noted.  Ureters were noted with normal urine jets from each one was seen.  Foley was left out after the cystoscopic fluid was drained and cystoscope removed.  Sponge, lap, needle, instrument counts were correct x2. Patient tolerated the procedure very well. She was awakened from anesthesia, extubated and taken to recovery in stable condition.      Dr. Karma Greaser

## 2023-03-09 NOTE — Transfer of Care (Signed)
Immediate Anesthesia Transfer of Care Note  Patient: Shirley Fleming  Procedure(s) Performed: XI ROBOTIC ASSISTED LAPAROSCOPIC HYSTERECTOMY AND SALPINGECTOMY (Bilateral: Abdomen) CYSTOSCOPY (Bladder)  Patient Location: PACU  Anesthesia Type:General  Level of Consciousness: awake, alert , and oriented  Airway & Oxygen Therapy: Patient Spontanous Breathing and Patient connected to nasal cannula oxygen  Post-op Assessment: Report given to RN and Post -op Vital signs reviewed and stable  Post vital signs: Reviewed and stable  Last Vitals:  Vitals Value Taken Time  BP 129/83 03/09/23 0855  Temp    Pulse 65 03/09/23 0856  Resp 15 03/09/23 0856  SpO2 100 % 03/09/23 0856  Vitals shown include unfiled device data.  Last Pain:  Vitals:   03/09/23 0618  TempSrc: Oral      Patients Stated Pain Goal: 7 (03/09/23 0618)  Complications: No notable events documented.

## 2023-03-09 NOTE — Discharge Instructions (Signed)
Please do not take Tylenol until after 1230p today.  Post Anesthesia Home Care Instructions  Activity: Get plenty of rest for the remainder of the day. A responsible adult should stay with you for 24 hours following the procedure.  For the next 24 hours, DO NOT: -Drive a car -Advertising copywriter -Drink alcoholic beverages -Take any medication unless instructed by your physician -Make any legal decisions or sign important papers.  Meals: Start with liquid foods such as gelatin or soup. Progress to regular foods as tolerated. Avoid greasy, spicy, heavy foods. If nausea and/or vomiting occur, drink only clear liquids until the nausea and/or vomiting subsides. Call your physician if vomiting continues.  Special Instructions/Symptoms: Your throat may feel dry or sore from the anesthesia or the breathing tube placed in your throat during surgery. If this causes discomfort, gargle with warm salt water. The discomfort should disappear within 24 hours.  If you had a scopolamine patch placed behind your ear for the management of post- operative nausea and/or vomiting:  1. The medication in the patch is effective for 72 hours, after which it should be removed.  Wrap patch in a tissue and discard in the trash. Wash hands thoroughly with soap and water. 2. You may remove the patch earlier than 72 hours if you experience unpleasant side effects which may include dry mouth, dizziness or visual disturbances. 3. Avoid touching the patch. Wash your hands with soap and water after contact with the patch.

## 2023-03-12 ENCOUNTER — Encounter: Payer: Self-pay | Admitting: Obstetrics and Gynecology

## 2023-03-12 LAB — SURGICAL PATHOLOGY

## 2023-03-13 ENCOUNTER — Encounter (HOSPITAL_BASED_OUTPATIENT_CLINIC_OR_DEPARTMENT_OTHER): Payer: Self-pay | Admitting: Obstetrics and Gynecology

## 2023-03-13 DIAGNOSIS — F331 Major depressive disorder, recurrent, moderate: Secondary | ICD-10-CM | POA: Diagnosis not present

## 2023-03-13 DIAGNOSIS — F419 Anxiety disorder, unspecified: Secondary | ICD-10-CM | POA: Diagnosis not present

## 2023-03-16 ENCOUNTER — Ambulatory Visit (INDEPENDENT_AMBULATORY_CARE_PROVIDER_SITE_OTHER): Payer: BC Managed Care – PPO | Admitting: Neurology

## 2023-03-16 ENCOUNTER — Encounter: Payer: Self-pay | Admitting: Neurology

## 2023-03-16 VITALS — BP 98/66 | HR 76 | Ht 65.5 in | Wt 202.0 lb

## 2023-03-16 DIAGNOSIS — M542 Cervicalgia: Secondary | ICD-10-CM | POA: Diagnosis not present

## 2023-03-16 DIAGNOSIS — G43009 Migraine without aura, not intractable, without status migrainosus: Secondary | ICD-10-CM | POA: Diagnosis not present

## 2023-03-16 NOTE — Patient Instructions (Signed)
For migraines: Migraine prevention:  Continue propranolol 10 mg BID Migraine rescue:  Continue sumatriptan 100 mg PRN or ibuprofen PRN Limit use of pain relievers to no more than 2 days out of week to prevent risk of rebound or medication-overuse headache. Keep headache diary   Physical therapy for neck pain     Return to clinic in 6 months  The physicians and staff at [facility] are committed to providing excellent care. You may receive a survey requesting feedback about your experience at our office. We strive to receive "very good" responses to the survey questions. If you feel that your experience would prevent you from giving the office a "very good " response, please contact our office to try to remedy the situation. We may be reached at [phone]. Thank you for taking the time out of your busy day to complete the survey.  Jacquelyne Balint, MD [facility]

## 2023-03-26 ENCOUNTER — Encounter: Payer: Self-pay | Admitting: Obstetrics and Gynecology

## 2023-03-26 ENCOUNTER — Ambulatory Visit (INDEPENDENT_AMBULATORY_CARE_PROVIDER_SITE_OTHER): Payer: BC Managed Care – PPO | Admitting: Obstetrics and Gynecology

## 2023-03-26 VITALS — BP 126/82 | HR 77 | Ht 65.5 in | Wt 202.0 lb

## 2023-03-26 DIAGNOSIS — Z09 Encounter for follow-up examination after completed treatment for conditions other than malignant neoplasm: Secondary | ICD-10-CM

## 2023-03-26 NOTE — Progress Notes (Signed)
Patient presents for 2 week postop from [facility], bilateral salpingectomy, cystoscopy. She is doing well. No fevers, VB, dysuria or severe abdominal pain.  BP 126/82 (BP Location: Left Arm, Patient Position: Sitting, Cuff Size: Normal)   Pulse 77   Ht 5' 5.5" (1.664 m)   Wt 202 lb (91.6 kg)   LMP 02/11/2023 (Exact Date)   SpO2 98%   BMI 33.10 kg/m   Abdomen: incisions I/c/d, NT, ND  A/p PO from [facility] 2 weeks doing well Encouraged no heavy lifting, pushing, pulling greater than 10 lbs for full 8 weeks 2. Pelvic rest for the entire 10 wks 3. RTC with any concerns or with heavy bleeding, fevers or severe abdominal pain.  Dr. Karma Greaser

## 2023-03-27 ENCOUNTER — Encounter: Payer: Self-pay | Admitting: Family

## 2023-03-27 ENCOUNTER — Ambulatory Visit: Payer: BC Managed Care – PPO | Admitting: Family

## 2023-03-27 ENCOUNTER — Other Ambulatory Visit (HOSPITAL_BASED_OUTPATIENT_CLINIC_OR_DEPARTMENT_OTHER): Payer: Self-pay

## 2023-03-27 VITALS — BP 114/81 | HR 65 | Temp 98.0°F | Ht 66.6 in | Wt 203.2 lb

## 2023-03-27 DIAGNOSIS — R110 Nausea: Secondary | ICD-10-CM

## 2023-03-27 DIAGNOSIS — F419 Anxiety disorder, unspecified: Secondary | ICD-10-CM | POA: Diagnosis not present

## 2023-03-27 DIAGNOSIS — F331 Major depressive disorder, recurrent, moderate: Secondary | ICD-10-CM | POA: Diagnosis not present

## 2023-03-27 DIAGNOSIS — R42 Dizziness and giddiness: Secondary | ICD-10-CM | POA: Diagnosis not present

## 2023-03-27 DIAGNOSIS — H65191 Other acute nonsuppurative otitis media, right ear: Secondary | ICD-10-CM | POA: Diagnosis not present

## 2023-03-27 MED ORDER — ONDANSETRON 4 MG PO TBDP
4.0000 mg | ORAL_TABLET | Freq: Three times a day (TID) | ORAL | 0 refills | Status: AC | PRN
  Filled 2023-03-27: qty 20, 7d supply, fill #0

## 2023-03-27 MED ORDER — MECLIZINE HCL 25 MG PO TABS
12.5000 mg | ORAL_TABLET | Freq: Three times a day (TID) | ORAL | 0 refills | Status: AC | PRN
Start: 2023-03-27 — End: ?
  Filled 2023-03-27: qty 30, 10d supply, fill #0

## 2023-03-27 NOTE — Progress Notes (Signed)
Patient ID: Shirley Fleming, female    DOB: Dec 06, 1975, 47 y.o.   MRN: [id number]  Chief Complaint  Patient presents with   Dizziness    Pt c/o dizziness when she woke up on Friday morning. Pt states she has woke up with dizziness in the past and dx with vertigo. Has tried dramamine which did help slightly.    Discussed the use of AI scribe software for clinical note transcription with the patient, who gave verbal consent to proceed.  History of Present Illness   The patient, with a history of vertigo, presents with a recurrence of symptoms. She describes the sensation as 'everything whirling,' which is exacerbated by certain movements such as looking down or tilting back. The vertigo is severe enough to disrupt sleep, as she has been sleeping sitting up to avoid triggering symptoms. The patient has been managing symptoms with over-the-counter meclizine and dramamine, which she reports as helpful.  The patient's last episode of vertigo was two years ago, and no trigger was identified. She recently underwent a hysterectomy and stopped taking birth control, which she believes may have affected her hormone levels. She also reports some ear discomfort and has a history of fluid in the ear causing dizziness.  The patient has tried the Epley maneuver in the past, which initially worsened symptoms but led to improvement after 24-36 hours. She is considering trying this again once her husband returns home. She also has an upcoming physical therapy appointment for her neck, where she may inquire about vertigo treatments.           Assessment & Plan:     Vertigo - Recurrent episodes, last episode two years ago. Current episode started after recent surgery (hysterectomy) and stopping OCP, either of which could be triggering her sx. Symptoms worse with certain head movements. Previous Epley maneuver worsened symptoms initially but then improved after 24-36 hours. Currently managing symptoms with  over-the-counter meclizine. -Prescribe higher dose meclizine (25mg  tid prn, can be split to 12.5mg  for daytime use). -Consider another Epley maneuver or alternative vestibular maneuver via PT when patient's support is available at home. -Call office next week if sx are not improved.  Right ear effusion - Patient has history of fluid in ear causing dizziness. Non-mucoid effusion noted in right TM. Left TM wnl. -Continue use of steroid nasal spray qd or bid to help symptoms. -Advised popping sensation in ear is normal as fluid is dissipating. -RTO precautions provided if no improvement in sx in 2w.   Nausea - Associated with vertigo. -Prescribing Zofran 4mg  ODT q8h for use as needed.      Subjective:    Outpatient Medications Prior to Visit  Medication Sig Dispense Refill   albuterol (VENTOLIN HFA) 108 (90 Base) MCG/ACT inhaler Inhale 1-2 puffs into the lungs every 6 (six) hours as needed. 6.7 g 2   ALPRAZolam (XANAX) 0.25 MG tablet Take 1 tablet (0.25 mg total) by mouth every 8 (eight) hours as needed. 30 tablet 2   cholecalciferol (VITAMIN D3) 25 MCG (1000 UT) tablet Take 1,000 Units by mouth daily.     ferrous sulfate 324 MG TBEC Take 65 mg by mouth daily with breakfast.     FLUoxetine (PROZAC) 20 MG capsule Take 1 capsule (20 mg total) by mouth daily. 90 capsule 1   fluticasone (FLONASE) 50 MCG/ACT nasal spray Place 1 spray into both nostrils as needed.     Magnesium 250 MG TABS Take 250 mg by mouth at bedtime.  Multiple Vitamin (MULTIVITAMIN) tablet Take 1 tablet by mouth daily.     norethindrone (INCASSIA) 0.35 MG tablet Take 1 tablet (0.35 mg total) by mouth daily. 84 tablet 3   propranolol (INDERAL) 10 MG tablet Take 1 tablet (10 mg total) by mouth every 12 (twelve) hours as needed. 60 tablet 1   Saline (SIMPLY SALINE) 0.9 % AERS Place 2 each into the nose as directed. Use nightly for sinus hygiene long-term.  Can also be used as many times daily as desired to assist with  clearing congested sinuses. 127 mL 11   SUMAtriptan (IMITREX) 100 MG tablet Take 1 tablet (100 mg total) by mouth once as needed for up to 1 dose for migraine. May repeat in 2 hours if headache persists or recurs. 10 tablet 11   UNABLE TO FIND Med Name: fiber gummy takes 2     ibuprofen (ADVIL) 800 MG tablet Take 1 tablet (800 mg total) by mouth every 8 (eight) hours as needed. (Patient not taking: Reported on 03/27/2023) 30 tablet 1   metoCLOPramide (REGLAN) 10 MG tablet Take 1 tablet (10 mg total) by mouth every 6 (six) hours as needed for nausea. (Patient not taking: Reported on 03/27/2023) 5 tablet 0   oxyCODONE (ROXICODONE) 5 MG immediate release tablet Take 1 tablet (5 mg total) by mouth every 4 (four) hours as needed for severe pain (pain score 7-10). (Patient not taking: Reported on 03/27/2023) 8 tablet 0   No facility-administered medications prior to visit.   Past Medical History:  Diagnosis Date   Anemia    takes iron- heavy menstral cycle   Anxiety    on meds   Asthma    uses inhaler with exercise; Follows w/ PCP.   Depression 1995   Off and on since teens   Empty sella (HCC)    Partially empty sella per MR brain 05/07/22, Follows w/ neurololgy.   GERD (gastroesophageal reflux disease)    OTC PRN meds   History of COVID-19 12/23/2020   Hypertension Jan 2021   resolved per pt, no meds   Hypoglycemia    Inflamed seborrheic keratosis 2024   Follows w/ [facility].   Migraine with aura    Follows w/ Dr. Jacquelyne Balint, neurologist. See 05/07/22 MRI of brain in Epic.   Palpitations    after having Covid, takes Propranolol prn, saw cardiologist, Dr. Rollene Rotunda, 06/17/20 Long term monitor in Epic   Pneumonia    mother was a smoker, pt states growing up she had pneumonia and bronchitis several times   Seasonal allergies    Wears glasses    for driving only   Past Surgical History:  Procedure Laterality Date   APPENDECTOMY  2003   COLONOSCOPY  02/09/2023   1 - 4mm  polyp   CYSTOSCOPY N/A 03/09/2023   Procedure: CYSTOSCOPY;  Surgeon: Earley Favor, MD;  [facility];  Service: Gynecology;  Laterality: N/A;   ROBOTIC ASSISTED LAPAROSCOPIC HYSTERECTOMY AND SALPINGECTOMY Bilateral 03/09/2023   Procedure: XI ROBOTIC ASSISTED LAPAROSCOPIC HYSTERECTOMY AND SALPINGECTOMY;  Surgeon: Earley Favor, MD;  Location: [facility];  Service: Gynecology;  Laterality: Bilateral;   TONSILLECTOMY AND ADENOIDECTOMY  1993   Allergies  Allergen Reactions   Doxycycline Nausea Only   Erythromycin Nausea And Vomiting      Objective:    Physical Exam Vitals and nursing note reviewed.  Constitutional:      Appearance: Normal appearance.  HENT:  Right Ear: No tenderness. A middle ear effusion (serous) is present. Tympanic membrane is not erythematous or bulging.     Left Ear: Tympanic membrane and ear canal normal.  No middle ear effusion.  Eyes:     Extraocular Movements:     Right eye: No nystagmus.     Left eye: No nystagmus.  Cardiovascular:     Rate and Rhythm: Normal rate and regular rhythm.  Pulmonary:     Effort: Pulmonary effort is normal.     Breath sounds: Normal breath sounds.  Musculoskeletal:        General: Normal range of motion.  Skin:    General: Skin is warm and dry.  Neurological:     Mental Status: She is alert.  Psychiatric:        Mood and Affect: Mood normal.        Behavior: Behavior normal.    BP 114/81 (BP Location: Left Arm, Patient Position: Sitting, Cuff Size: Normal)   Pulse 65   Temp 98 F (36.7 C) (Temporal)   Ht 5' 6.6" (1.692 m)   Wt 203 lb 3.2 oz (92.2 kg)   LMP 02/11/2023 (Exact Date)   SpO2 96%   BMI 32.21 kg/m  Wt Readings from Last 3 Encounters:  03/27/23 203 lb 3.2 oz (92.2 kg)  03/26/23 202 lb (91.6 kg)  03/16/23 202 lb (91.6 kg)       Dulce Sellar, NP

## 2023-03-29 DIAGNOSIS — R519 Headache, unspecified: Secondary | ICD-10-CM | POA: Diagnosis not present

## 2023-03-29 DIAGNOSIS — R42 Dizziness and giddiness: Secondary | ICD-10-CM | POA: Diagnosis not present

## 2023-03-29 DIAGNOSIS — M542 Cervicalgia: Secondary | ICD-10-CM | POA: Diagnosis not present

## 2023-04-04 DIAGNOSIS — R519 Headache, unspecified: Secondary | ICD-10-CM | POA: Diagnosis not present

## 2023-04-04 DIAGNOSIS — M542 Cervicalgia: Secondary | ICD-10-CM | POA: Diagnosis not present

## 2023-04-04 DIAGNOSIS — R42 Dizziness and giddiness: Secondary | ICD-10-CM | POA: Diagnosis not present

## 2023-04-10 DIAGNOSIS — F331 Major depressive disorder, recurrent, moderate: Secondary | ICD-10-CM | POA: Diagnosis not present

## 2023-04-10 DIAGNOSIS — F419 Anxiety disorder, unspecified: Secondary | ICD-10-CM | POA: Diagnosis not present

## 2023-04-11 DIAGNOSIS — R519 Headache, unspecified: Secondary | ICD-10-CM | POA: Diagnosis not present

## 2023-04-11 DIAGNOSIS — M542 Cervicalgia: Secondary | ICD-10-CM | POA: Diagnosis not present

## 2023-04-11 DIAGNOSIS — R42 Dizziness and giddiness: Secondary | ICD-10-CM | POA: Diagnosis not present

## 2023-04-17 DIAGNOSIS — M542 Cervicalgia: Secondary | ICD-10-CM | POA: Diagnosis not present

## 2023-04-17 DIAGNOSIS — R42 Dizziness and giddiness: Secondary | ICD-10-CM | POA: Diagnosis not present

## 2023-04-17 DIAGNOSIS — R519 Headache, unspecified: Secondary | ICD-10-CM | POA: Diagnosis not present

## 2023-04-24 DIAGNOSIS — R519 Headache, unspecified: Secondary | ICD-10-CM | POA: Diagnosis not present

## 2023-04-24 DIAGNOSIS — R42 Dizziness and giddiness: Secondary | ICD-10-CM | POA: Diagnosis not present

## 2023-04-24 DIAGNOSIS — M542 Cervicalgia: Secondary | ICD-10-CM | POA: Diagnosis not present

## 2023-04-26 DIAGNOSIS — F419 Anxiety disorder, unspecified: Secondary | ICD-10-CM | POA: Diagnosis not present

## 2023-04-26 DIAGNOSIS — F331 Major depressive disorder, recurrent, moderate: Secondary | ICD-10-CM | POA: Diagnosis not present

## 2023-05-01 DIAGNOSIS — M542 Cervicalgia: Secondary | ICD-10-CM | POA: Diagnosis not present

## 2023-05-01 DIAGNOSIS — R42 Dizziness and giddiness: Secondary | ICD-10-CM | POA: Diagnosis not present

## 2023-05-01 DIAGNOSIS — R519 Headache, unspecified: Secondary | ICD-10-CM | POA: Diagnosis not present

## 2023-05-08 ENCOUNTER — Ambulatory Visit: Payer: BC Managed Care – PPO | Admitting: Physician Assistant

## 2023-05-08 ENCOUNTER — Other Ambulatory Visit (HOSPITAL_BASED_OUTPATIENT_CLINIC_OR_DEPARTMENT_OTHER): Payer: Self-pay

## 2023-05-08 ENCOUNTER — Ambulatory Visit: Payer: BC Managed Care – PPO | Admitting: Family Medicine

## 2023-05-08 VITALS — BP 120/90 | HR 77 | Temp 98.1°F | Wt 207.6 lb

## 2023-05-08 DIAGNOSIS — F331 Major depressive disorder, recurrent, moderate: Secondary | ICD-10-CM | POA: Diagnosis not present

## 2023-05-08 DIAGNOSIS — J0101 Acute recurrent maxillary sinusitis: Secondary | ICD-10-CM | POA: Diagnosis not present

## 2023-05-08 DIAGNOSIS — F419 Anxiety disorder, unspecified: Secondary | ICD-10-CM | POA: Diagnosis not present

## 2023-05-08 MED ORDER — AMOXICILLIN-POT CLAVULANATE 875-125 MG PO TABS
1.0000 | ORAL_TABLET | Freq: Two times a day (BID) | ORAL | 0 refills | Status: DC
  Filled 2023-05-08: qty 20, 10d supply, fill #0

## 2023-05-08 MED ORDER — PREDNISONE 20 MG PO TABS
40.0000 mg | ORAL_TABLET | Freq: Every day | ORAL | 0 refills | Status: AC
  Filled 2023-05-08: qty 10, 5d supply, fill #0

## 2023-05-08 NOTE — Progress Notes (Signed)
 Subjective:     Patient ID: Shirley Fleming, female    DOB: February 10, 1976, 48 y.o.   MRN: [id number]  Chief Complaint  Patient presents with   Ear Pain    Right side, with some intermittent ringing. Used Ciprofloxacin  drop with no relief.   Sinus pain and swelling    Right side. All symptoms since last week.    HPI Discussed the use of AI scribe software for clinical note transcription with the patient, who gave verbal consent to proceed.  History of Present Illness   The patient, with a history of migraines and a recent hysterectomy, presents with a week-long history of right-sided facial pain. The pain is described as being located in the right ear, jaw, neck, and sinus, with associated swelling. The patient reports a similar episode approximately a year and a half ago, which was evaluated with a CT scan that showed no abnormalities. At that time, the symptoms resolved with a course of either Augmentin  or Ciprofloxacin , the patient is unsure which. she did see ENT but symptoms had resolved by time saw them.   The current episode of pain began last week and has been associated with a sensation of fullness and chunky nasal discharge. The patient denies fever, cough, sore throat, and vision changes but reports a month-long history of vertigo that started after her recent surgery. The patient also reports a history of asthma and has been feeling slightly wheezy.  The patient has a known allergy to doxycycline  and is currently on Prozac  20mg , with a consideration to increase the dose. The patient's blood pressure is usually well-controlled. .       There are no preventive care reminders to display for this patient.  Past Medical History:  Diagnosis Date   Anemia    takes iron- heavy menstral cycle   Anxiety    on meds   Asthma    uses inhaler with exercise; Follows w/ PCP.   Depression 1995   Off and on since teens   Empty sella (HCC)    Partially empty sella per MR brain 05/07/22,  Follows w/ neurololgy.   GERD (gastroesophageal reflux disease)    OTC PRN meds   History of COVID-19 12/23/2020   Hypertension Jan 2021   resolved per pt, no meds   Hypoglycemia    Inflamed seborrheic keratosis 2024   Follows w/ [facility].   Migraine with aura    Follows w/ Dr. Venetia Potters, neurologist. See 05/07/22 MRI of brain in Epic.   Palpitations    after having Covid, takes Propranolol  prn, saw cardiologist, Dr. Lynwood Schilling, 06/17/20 Long term monitor in Epic   Pneumonia    mother was a smoker, pt states growing up she had pneumonia and bronchitis several times   Seasonal allergies    Wears glasses    for driving only    Past Surgical History:  Procedure Laterality Date   APPENDECTOMY  2003   COLONOSCOPY  02/09/2023   1 - 4mm polyp   CYSTOSCOPY N/A 03/09/2023   Procedure: CYSTOSCOPY;  Surgeon: Glennon Almarie POUR, MD;  Location: [facility];  Service: Gynecology;  Laterality: N/A;   ROBOTIC ASSISTED LAPAROSCOPIC HYSTERECTOMY AND SALPINGECTOMY Bilateral 03/09/2023   Procedure: XI ROBOTIC ASSISTED LAPAROSCOPIC HYSTERECTOMY AND SALPINGECTOMY;  Surgeon: Glennon Almarie POUR, MD;  Location: [facility];  Service: Gynecology;  Laterality: Bilateral;   TONSILLECTOMY AND ADENOIDECTOMY  1993     Current Outpatient Medications:    albuterol  (  VENTOLIN  HFA) 108 (90 Base) MCG/ACT inhaler, Inhale 1-2 puffs into the lungs every 6 (six) hours as needed., Disp: 6.7 g, Rfl: 2   ALPRAZolam  (XANAX ) 0.25 MG tablet, Take 1 tablet (0.25 mg total) by mouth every 8 (eight) hours as needed., Disp: 30 tablet, Rfl: 2   amoxicillin -clavulanate (AUGMENTIN ) 875-125 MG tablet, Take 1 tablet by mouth 2 (two) times daily., Disp: 20 tablet, Rfl: 0   cholecalciferol (VITAMIN D3) 25 MCG (1000 UT) tablet, Take 1,000 Units by mouth daily., Disp: , Rfl:    ferrous sulfate 324 MG TBEC, Take 65 mg by mouth daily with breakfast., Disp: , Rfl:    FLUoxetine  (PROZAC )  20 MG capsule, Take 1 capsule (20 mg total) by mouth daily., Disp: 90 capsule, Rfl: 1   fluticasone (FLONASE) 50 MCG/ACT nasal spray, Place 1 spray into both nostrils as needed., Disp: , Rfl:    ibuprofen  (ADVIL ) 800 MG tablet, Take 1 tablet (800 mg total) by mouth every 8 (eight) hours as needed., Disp: 30 tablet, Rfl: 1   Magnesium 250 MG TABS, Take 250 mg by mouth at bedtime., Disp: , Rfl:    meclizine  (ANTIVERT ) 25 MG tablet, Take 0.5-1 tablets (12.5-25 mg total) by mouth 3 (three) times daily as needed for dizziness., Disp: 30 tablet, Rfl: 0   metoCLOPramide  (REGLAN ) 10 MG tablet, Take 1 tablet (10 mg total) by mouth every 6 (six) hours as needed for nausea., Disp: 5 tablet, Rfl: 0   Multiple Vitamin (MULTIVITAMIN) tablet, Take 1 tablet by mouth daily., Disp: , Rfl:    norethindrone  (INCASSIA ) 0.35 MG tablet, Take 1 tablet (0.35 mg total) by mouth daily., Disp: 84 tablet, Rfl: 3   ondansetron  (ZOFRAN -ODT) 4 MG disintegrating tablet, Take 1 tablet (4 mg total) by mouth every 8 (eight) hours as needed for nausea or vomiting., Disp: 20 tablet, Rfl: 0   oxyCODONE  (ROXICODONE ) 5 MG immediate release tablet, Take 1 tablet (5 mg total) by mouth every 4 (four) hours as needed for severe pain (pain score 7-10)., Disp: 8 tablet, Rfl: 0   predniSONE  (DELTASONE ) 20 MG tablet, Take 2 tablets (40 mg total) by mouth daily with breakfast for 5 days., Disp: 10 tablet, Rfl: 0   propranolol  (INDERAL ) 10 MG tablet, Take 1 tablet (10 mg total) by mouth every 12 (twelve) hours as needed., Disp: 60 tablet, Rfl: 1   Saline (SIMPLY SALINE) 0.9 % AERS, Place 2 each into the nose as directed. Use nightly for sinus hygiene long-term.  Can also be used as many times daily as desired to assist with clearing congested sinuses., Disp: 127 mL, Rfl: 11   SUMAtriptan  (IMITREX ) 100 MG tablet, Take 1 tablet (100 mg total) by mouth once as needed for up to 1 dose for migraine. May repeat in 2 hours if headache persists or recurs.,  Disp: 10 tablet, Rfl: 11   UNABLE TO FIND, Med Name: fiber gummy takes 2, Disp: , Rfl:   Allergies  Allergen Reactions   Doxycycline  Nausea Only   Erythromycin Nausea And Vomiting   ROS neg/noncontributory except as noted HPI/below      Objective:     BP (!) 120/90   Pulse 77   Temp 98.1 F (36.7 C) (Temporal)   Wt 207 lb 9.6 oz (94.2 kg)   LMP 02/11/2023 (Exact Date)   SpO2 98%   BMI 32.91 kg/m  Wt Readings from Last 3 Encounters:  05/08/23 207 lb 9.6 oz (94.2 kg)  03/27/23 203 lb 3.2 oz (92.2  kg)  03/26/23 202 lb (91.6 kg)    Physical Exam   Gen: WDWN NAD HEENT: NCAT, conjunctiva not injected, sclera nonicteric TM WNL B, OP moist, no exudates  some swelling R cheek and TTP.  OP L side higher than R.  No jaw TTP or opening jaw.  Sl tender behind R ear.  No rash NECK:  supple, no thyromegaly, no nodes, no carotid bruits CARDIAC: RRR, S1S2+, no murmur.  LUNGS: CTAB. No wheezes EXT:  no edema MSK: no gross abnormalities.  NEURO: A&O x3.  CN II-XII intact.  PSYCH: normal mood. Good eye contact     Assessment & Plan:  Acute recurrent maxillary sinusitis  Other orders -     Amoxicillin -Pot Clavulanate; Take 1 tablet by mouth 2 (two) times daily.  Dispense: 20 tablet; Refill: 0 -     predniSONE ; Take 2 tablets (40 mg total) by mouth daily with breakfast for 5 days.  Dispense: 10 tablet; Refill: 0  Assessment and Plan    Right Facial Pain and Swelling Experiencing right facial pain and swelling for one week, affecting the ear, jaw, neck, and sinus. Previous similar episode resolved with antibiotics. Current symptoms include vertigo, mild fever, congestion, and wheezing.. Differential diagnosis includes sinus infection, viral infection, or recurrent herpes virus without rash. Discussed Augmentin  for sinus infection and steroids for inflammation, noting potential side effects. Prefers to have steroids available if symptoms persist..  Vertigo Reports vertigo for one  month, beginning after surgery, with no clear link to facial pain and swelling. Likely related to recent surgery. Monitor symptoms and reassess if vertigo persists or worsens.  Asthma Occasional wheezing reported, especially when ill, but no current respiratory distress. Monitor asthma symptoms and use rescue inhaler as needed.  Migraine Has a history of migraines, with no recent occurrences. Neurologist suggested potential improvement post-hysterectomy. Continue current migraine management plan.  General Health Maintenance Discussed shingles vaccination, typically covered by insurance starting at age 19. Aware of cost and benefits. Consider vaccination when eligible.  Follow-up Send message to primary care provider via MyChart for Prozac  dosage adjustment.        Return if symptoms worsen or fail to improve.  Jenkins CHRISTELLA Carrel, MD

## 2023-05-08 NOTE — Patient Instructions (Signed)
 It was very nice to see you today!  Meds sent   PLEASE NOTE:  If you had any lab tests please let us know if you have not heard back within a few days. You may see your results on MyChart before we have a chance to review them but we will give you a call once they are reviewed by [place]. If we ordered any referrals today, please let us know if you have not heard from their office within the next week.   Please try these tips to maintain a healthy lifestyle:  Eat most of your calories during the day when you are active. Eliminate processed foods including packaged sweets (pies, cakes, cookies), reduce intake of potatoes, white bread, white pasta, and white rice. Look for whole grain options, oat flour or almond flour.  Each meal should contain half fruits/vegetables, one quarter protein, and one quarter carbs (no bigger than a computer mouse).  Cut down on sweet beverages. This includes juice, soda, and sweet tea. Also watch fruit intake, though this is a healthier sweet option, it still contains natural sugar! Limit to 3 servings daily.  Drink at least 1 glass of water with each meal and aim for at least 8 glasses per day  Exercise at least 150 minutes every week.

## 2023-05-09 DIAGNOSIS — R519 Headache, unspecified: Secondary | ICD-10-CM | POA: Diagnosis not present

## 2023-05-09 DIAGNOSIS — R42 Dizziness and giddiness: Secondary | ICD-10-CM | POA: Diagnosis not present

## 2023-05-09 DIAGNOSIS — M542 Cervicalgia: Secondary | ICD-10-CM | POA: Diagnosis not present

## 2023-05-10 ENCOUNTER — Encounter: Payer: Self-pay | Admitting: Obstetrics and Gynecology

## 2023-05-10 ENCOUNTER — Ambulatory Visit (INDEPENDENT_AMBULATORY_CARE_PROVIDER_SITE_OTHER): Payer: BC Managed Care – PPO | Admitting: Obstetrics and Gynecology

## 2023-05-10 VITALS — BP 120/82 | HR 75

## 2023-05-10 DIAGNOSIS — Z09 Encounter for follow-up examination after completed treatment for conditions other than malignant neoplasm: Secondary | ICD-10-CM

## 2023-05-10 IMAGING — DX DG HIP (WITH OR WITHOUT PELVIS) 2-3V*R*
3 series · 3 of 3 positions shown · non-contrast
Comparison: None.

CLINICAL DATA: Worsening right hip and low back pain for 1 month.

EXAM:
DG HIP (WITH OR WITHOUT PELVIS) 2-3V RIGHT

[pelvis ap]
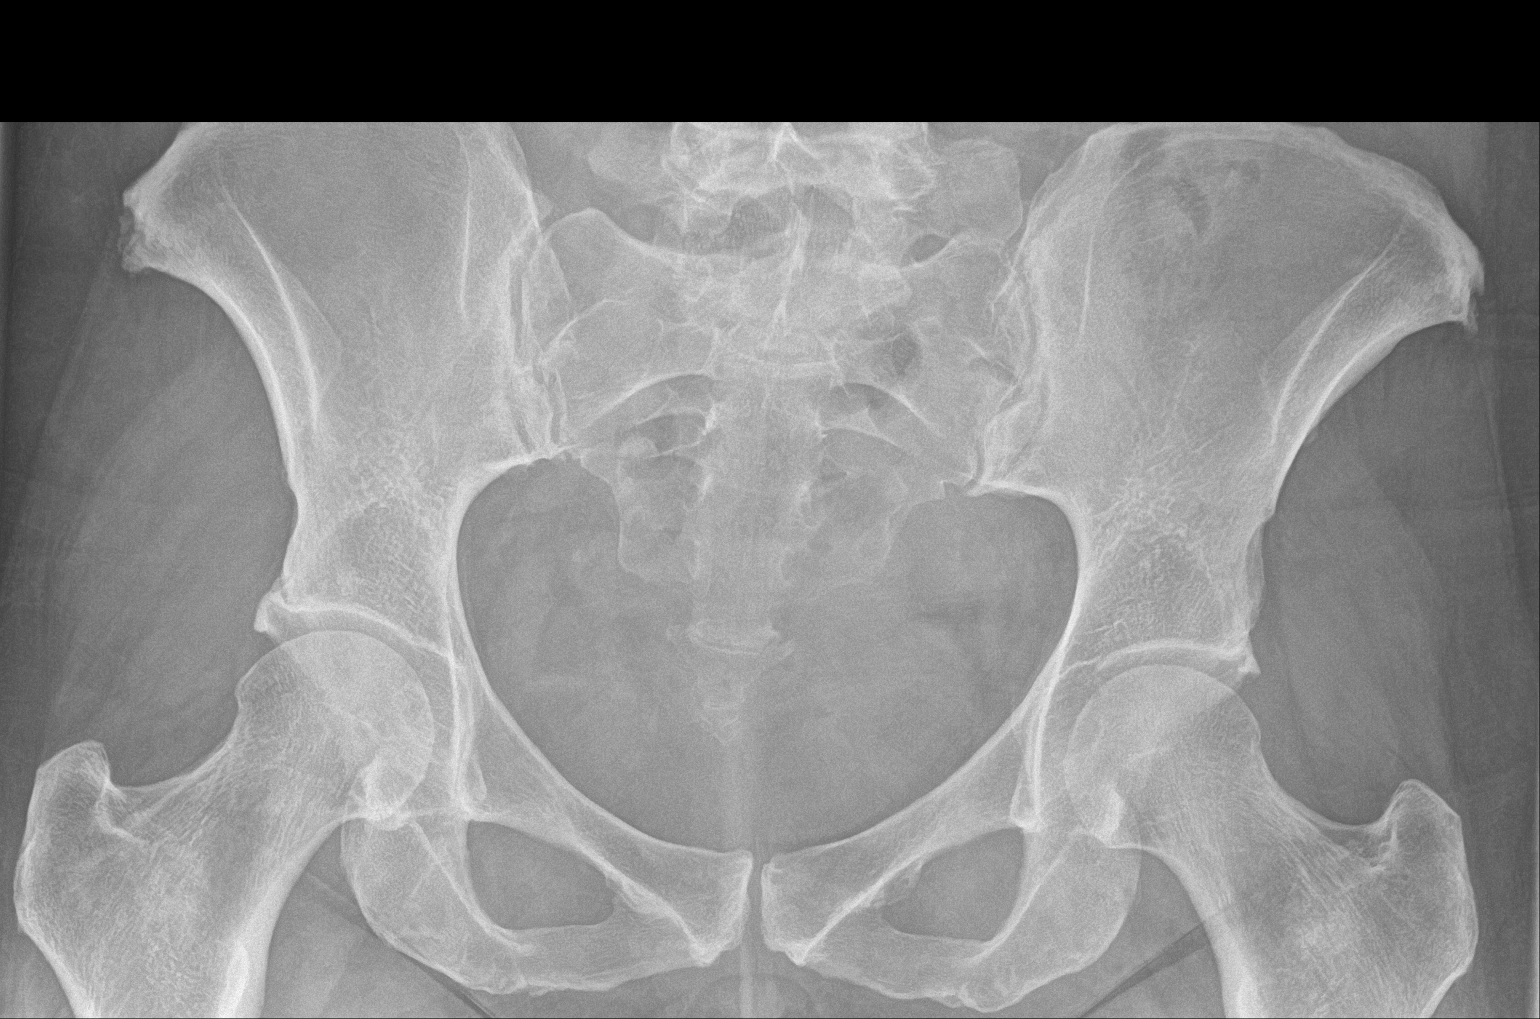

[hip ap]
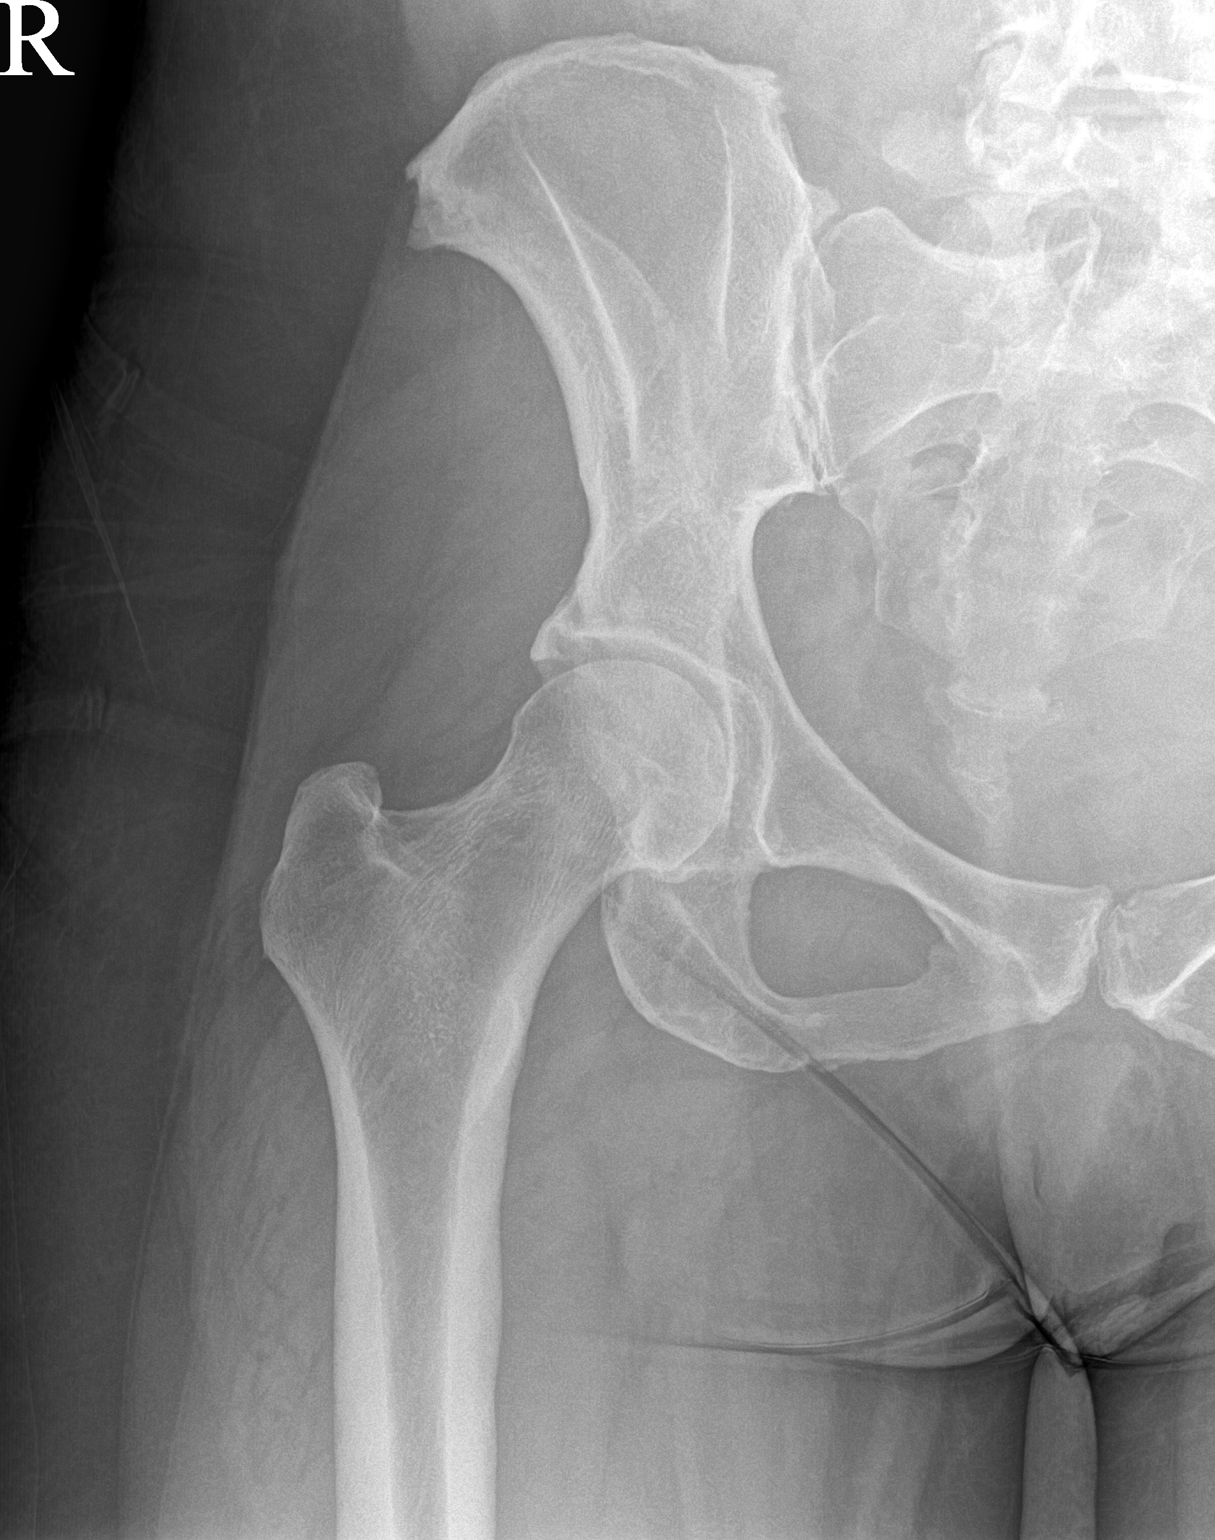

[hip frog leg]
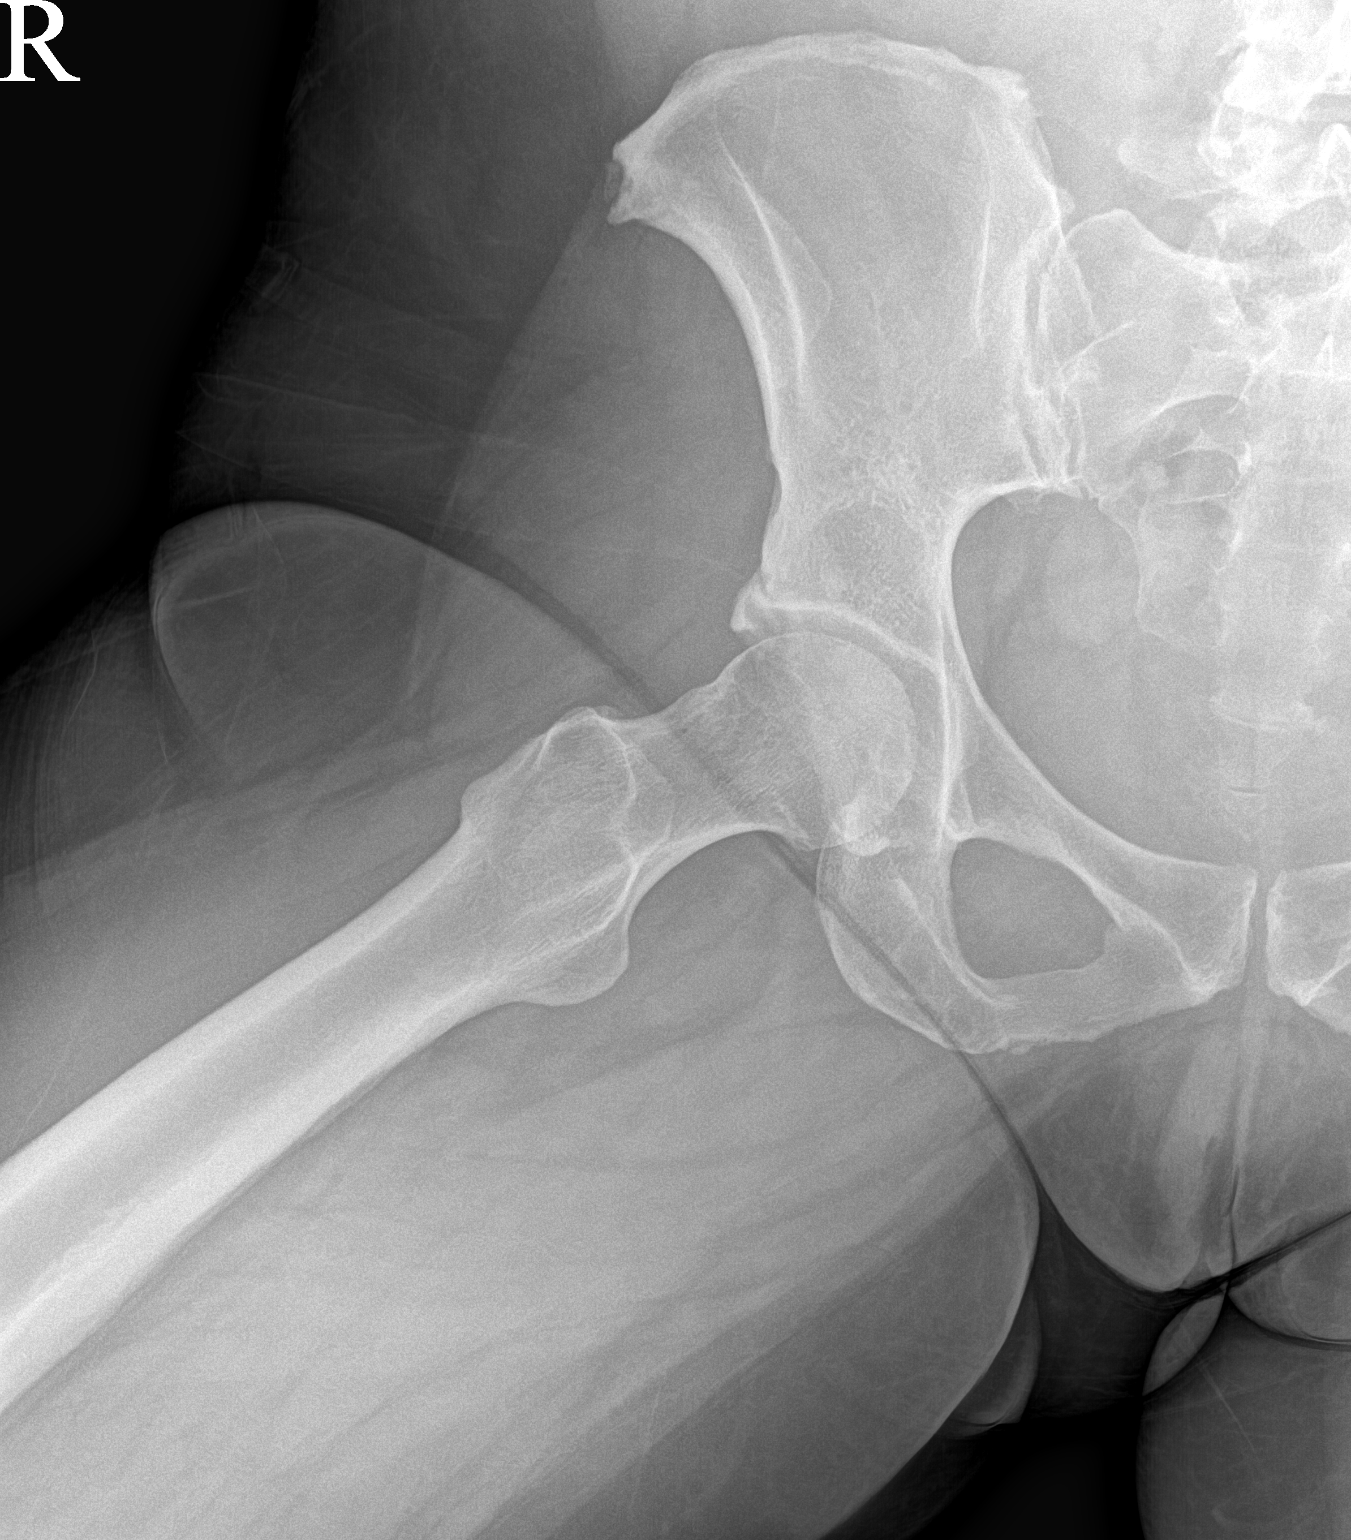

[3 of 3 positions shown; findings below may reference images not displayed]

FINDINGS: There is no evidence of hip fracture or dislocation. There is no
evidence of arthropathy or other focal bone abnormality.
IMPRESSION: Negative.

## 2023-05-10 IMAGING — DX DG LUMBAR SPINE 2-3V
3 series · 3 of 3 positions shown · non-contrast
Comparison: None.

CLINICAL DATA: Worsening right hip and low back pain for 1 month.

EXAM:
LUMBAR SPINE - 2-3 VIEW

[l-spine ap]
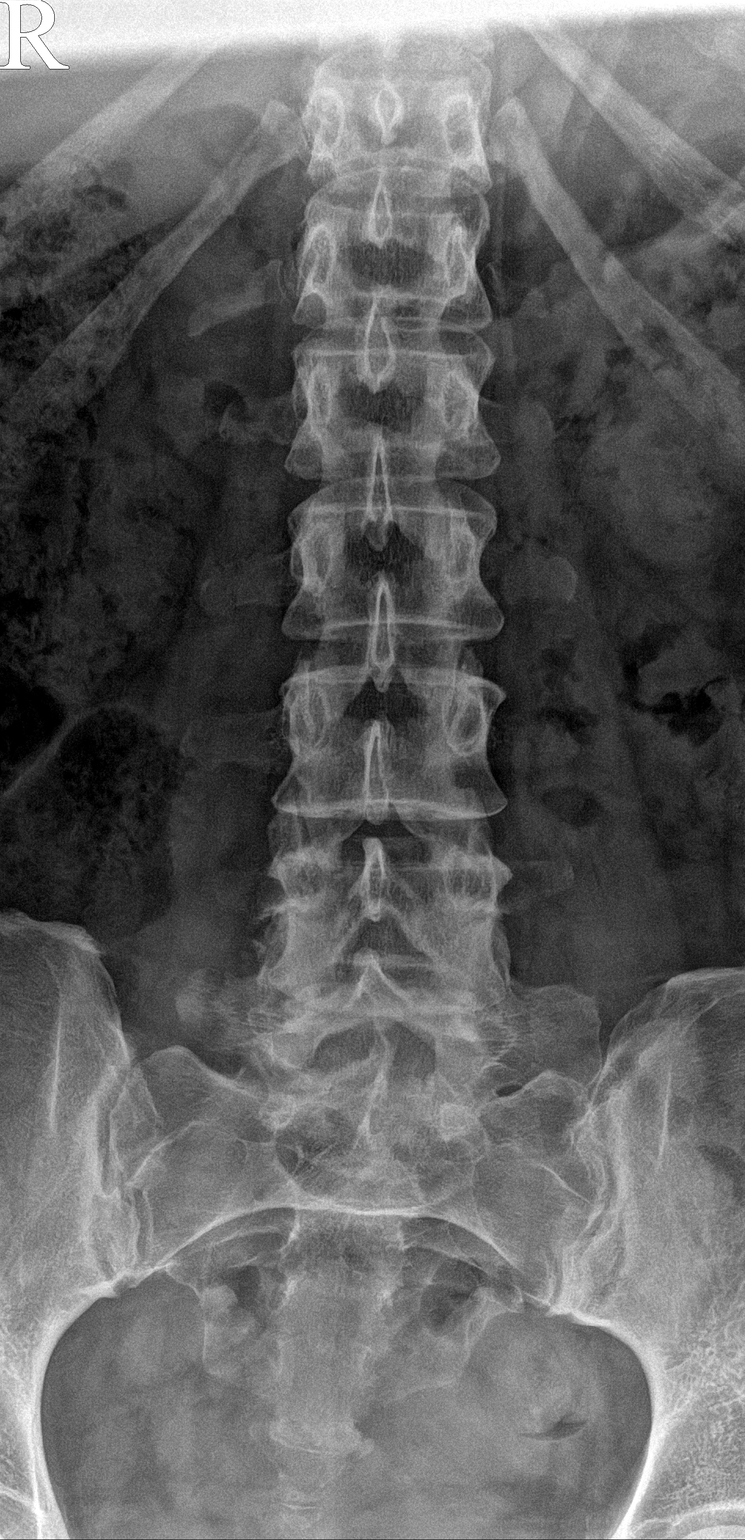

[l-spine lateral]
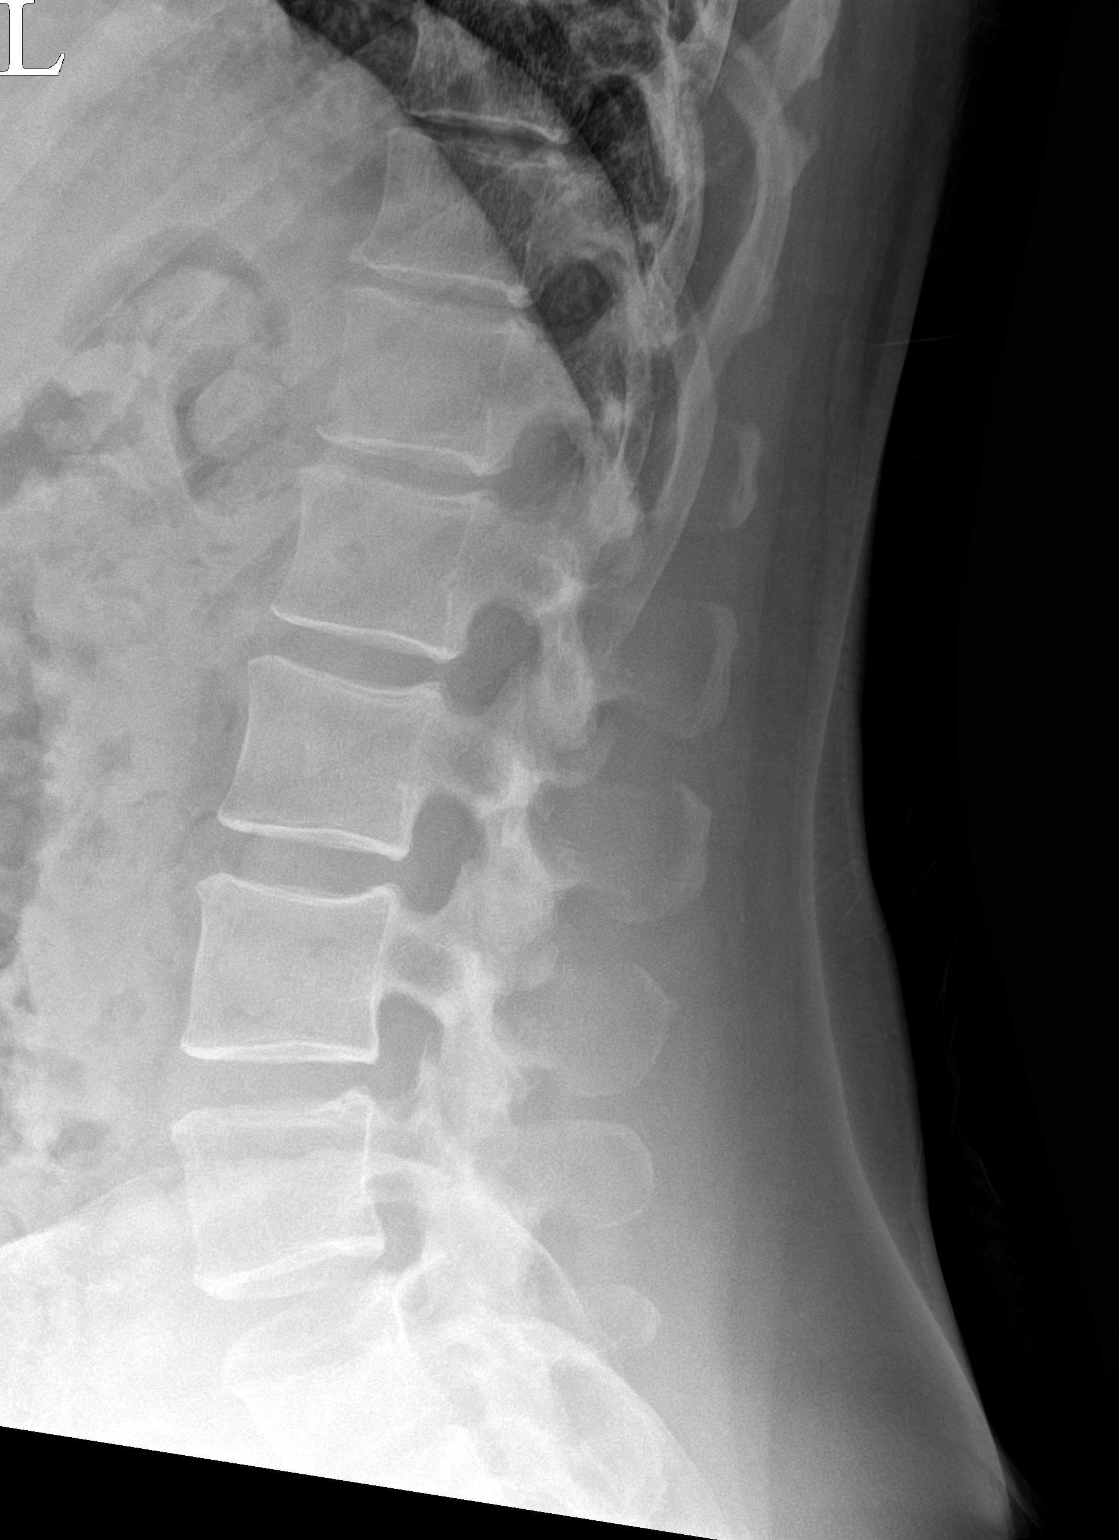

[l-spine spot]
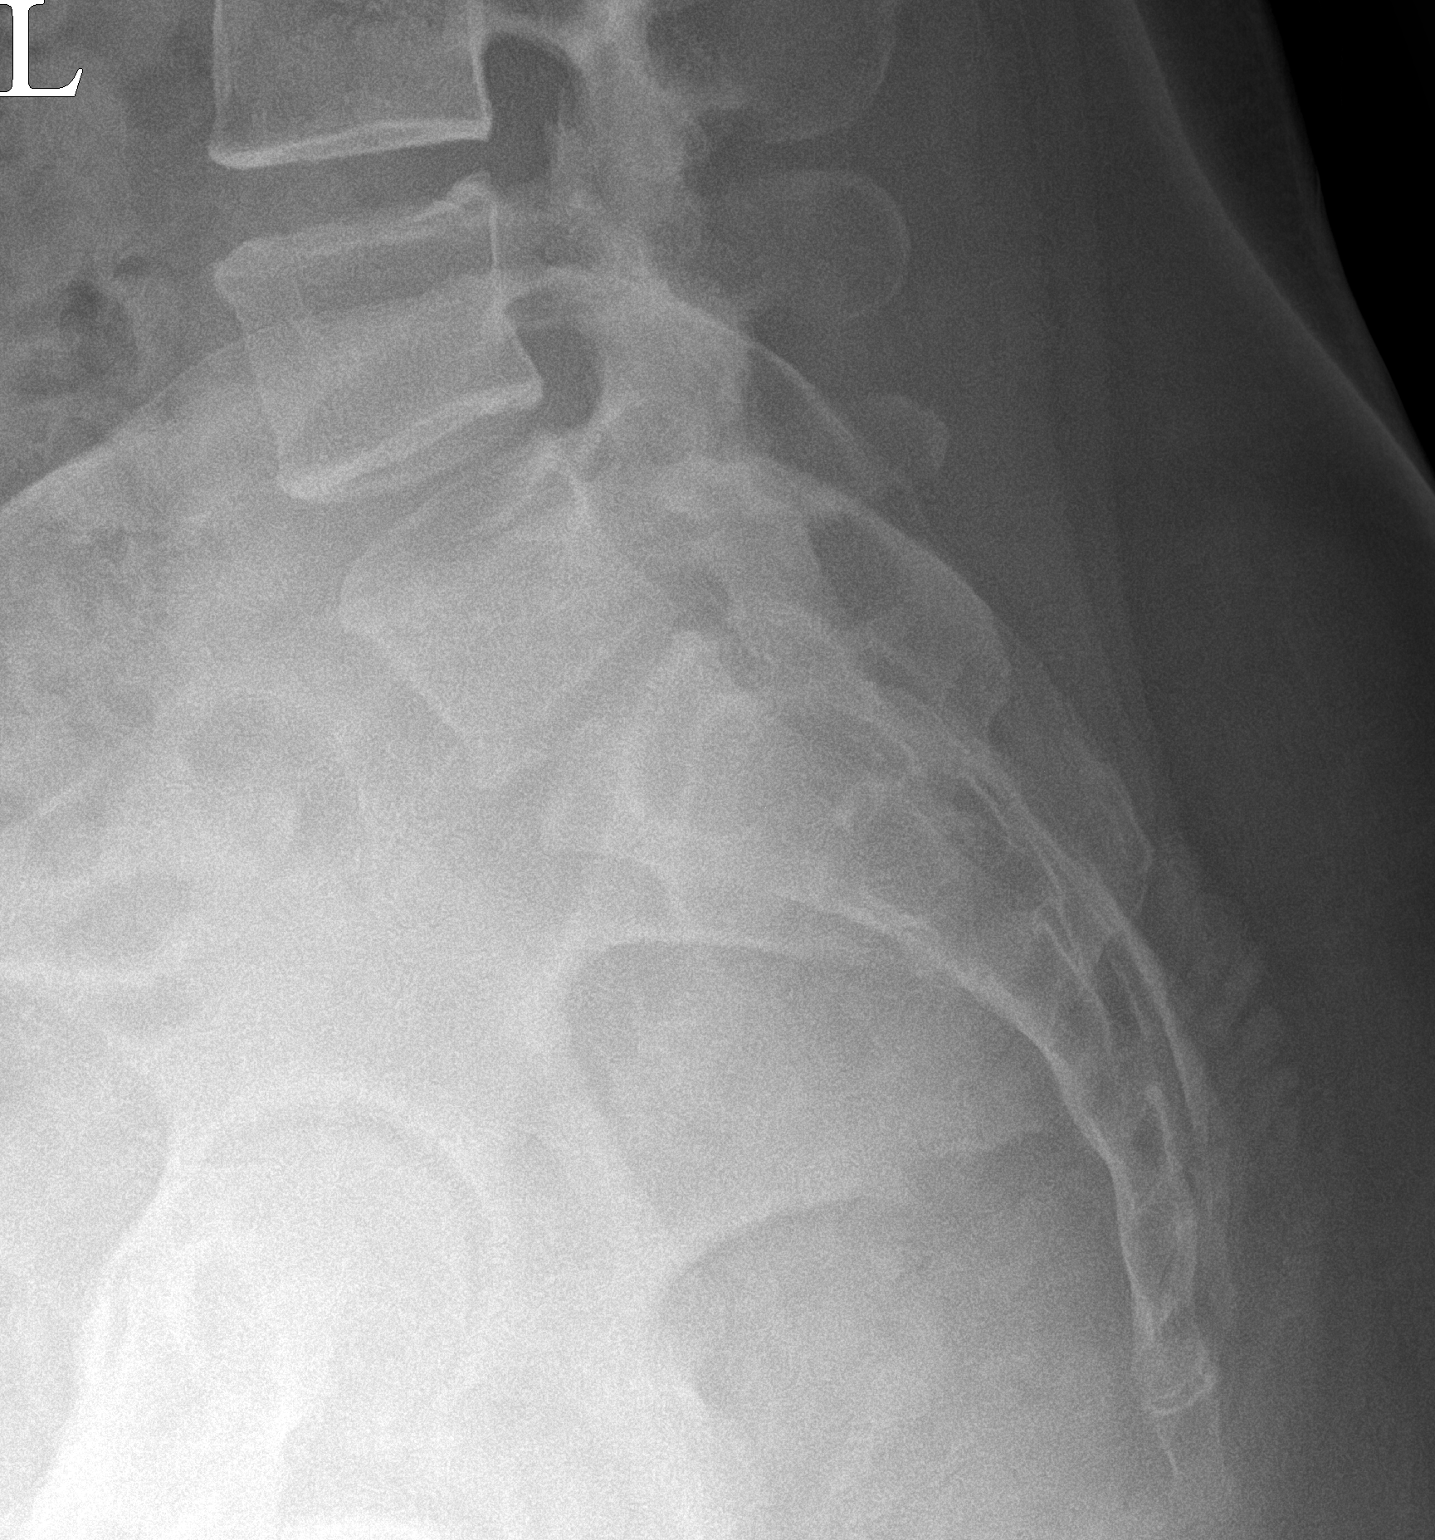

[3 of 3 positions shown; findings below may reference images not displayed]

FINDINGS: Transitional thoracolumbar and lumbosacral vertebrae with 4
intervening non-rib-bearing lumbar vertebrae. Mild anterior spur
formation at multiple levels of the lumbar and lower thoracic spine.
No fractures, pars defects or subluxations.
IMPRESSION: Mild multilevel degenerative changes.

## 2023-05-10 NOTE — Progress Notes (Signed)
Patient presents for 10 week postop from [facility], bilateral salpingectomy, cystoscopy. She is doing well. No fevers, dysuria or severe abdominal pain.  She is glad she had the procedure.  Did not some spotting after a long day.  BP 120/82   Pulse 75   LMP 02/11/2023 (Exact Date)   SpO2 99%   SVE: No sutures seen scant pink discharge noted.  Cuff intact. Qtip with slight tenderness with palpation.  A/p PO from [facility] 10 weeks doing well Resume activities 2. Pelvic rest for additional 2 wks 3. Encouraged annual mammograms and resume annual care with PCP.  RTC with any concerns.  Dr. Karma Greaser

## 2023-05-11 ENCOUNTER — Encounter: Payer: Self-pay | Admitting: Physician Assistant

## 2023-05-11 ENCOUNTER — Other Ambulatory Visit (HOSPITAL_BASED_OUTPATIENT_CLINIC_OR_DEPARTMENT_OTHER): Payer: Self-pay

## 2023-05-11 ENCOUNTER — Other Ambulatory Visit: Payer: Self-pay

## 2023-05-11 MED ORDER — FLUOXETINE HCL 10 MG PO TABS
10.0000 mg | ORAL_TABLET | Freq: Every day | ORAL | 1 refills | Status: DC
  Filled 2023-05-11: qty 90, 90d supply, fill #0

## 2023-05-15 DIAGNOSIS — R519 Headache, unspecified: Secondary | ICD-10-CM | POA: Diagnosis not present

## 2023-05-15 DIAGNOSIS — M542 Cervicalgia: Secondary | ICD-10-CM | POA: Diagnosis not present

## 2023-05-15 DIAGNOSIS — R42 Dizziness and giddiness: Secondary | ICD-10-CM | POA: Diagnosis not present

## 2023-05-17 ENCOUNTER — Other Ambulatory Visit: Payer: Self-pay | Admitting: Physician Assistant

## 2023-05-18 ENCOUNTER — Other Ambulatory Visit (HOSPITAL_BASED_OUTPATIENT_CLINIC_OR_DEPARTMENT_OTHER): Payer: Self-pay

## 2023-05-18 MED ORDER — PROPRANOLOL HCL 10 MG PO TABS
10.0000 mg | ORAL_TABLET | Freq: Two times a day (BID) | ORAL | 1 refills | Status: DC | PRN
  Filled 2023-05-18: qty 60, 30d supply, fill #0
  Filled 2023-06-22: qty 60, 30d supply, fill #1

## 2023-05-22 DIAGNOSIS — F419 Anxiety disorder, unspecified: Secondary | ICD-10-CM | POA: Diagnosis not present

## 2023-05-22 DIAGNOSIS — F331 Major depressive disorder, recurrent, moderate: Secondary | ICD-10-CM | POA: Diagnosis not present

## 2023-05-23 DIAGNOSIS — R519 Headache, unspecified: Secondary | ICD-10-CM | POA: Diagnosis not present

## 2023-05-23 DIAGNOSIS — M542 Cervicalgia: Secondary | ICD-10-CM | POA: Diagnosis not present

## 2023-05-23 DIAGNOSIS — R42 Dizziness and giddiness: Secondary | ICD-10-CM | POA: Diagnosis not present

## 2023-05-30 DIAGNOSIS — R519 Headache, unspecified: Secondary | ICD-10-CM | POA: Diagnosis not present

## 2023-05-30 DIAGNOSIS — R42 Dizziness and giddiness: Secondary | ICD-10-CM | POA: Diagnosis not present

## 2023-05-30 DIAGNOSIS — M542 Cervicalgia: Secondary | ICD-10-CM | POA: Diagnosis not present

## 2023-06-04 DIAGNOSIS — R42 Dizziness and giddiness: Secondary | ICD-10-CM | POA: Diagnosis not present

## 2023-06-04 DIAGNOSIS — M542 Cervicalgia: Secondary | ICD-10-CM | POA: Diagnosis not present

## 2023-06-04 DIAGNOSIS — R519 Headache, unspecified: Secondary | ICD-10-CM | POA: Diagnosis not present

## 2023-06-07 DIAGNOSIS — F419 Anxiety disorder, unspecified: Secondary | ICD-10-CM | POA: Diagnosis not present

## 2023-06-07 DIAGNOSIS — F331 Major depressive disorder, recurrent, moderate: Secondary | ICD-10-CM | POA: Diagnosis not present

## 2023-06-12 DIAGNOSIS — M542 Cervicalgia: Secondary | ICD-10-CM | POA: Diagnosis not present

## 2023-06-12 DIAGNOSIS — R42 Dizziness and giddiness: Secondary | ICD-10-CM | POA: Diagnosis not present

## 2023-06-12 DIAGNOSIS — R519 Headache, unspecified: Secondary | ICD-10-CM | POA: Diagnosis not present

## 2023-06-14 ENCOUNTER — Other Ambulatory Visit (HOSPITAL_BASED_OUTPATIENT_CLINIC_OR_DEPARTMENT_OTHER): Payer: Self-pay

## 2023-06-18 DIAGNOSIS — R42 Dizziness and giddiness: Secondary | ICD-10-CM | POA: Diagnosis not present

## 2023-06-18 DIAGNOSIS — M542 Cervicalgia: Secondary | ICD-10-CM | POA: Diagnosis not present

## 2023-06-18 DIAGNOSIS — R519 Headache, unspecified: Secondary | ICD-10-CM | POA: Diagnosis not present

## 2023-06-19 DIAGNOSIS — F419 Anxiety disorder, unspecified: Secondary | ICD-10-CM | POA: Diagnosis not present

## 2023-06-19 DIAGNOSIS — F331 Major depressive disorder, recurrent, moderate: Secondary | ICD-10-CM | POA: Diagnosis not present

## 2023-06-23 ENCOUNTER — Other Ambulatory Visit (HOSPITAL_BASED_OUTPATIENT_CLINIC_OR_DEPARTMENT_OTHER): Payer: Self-pay

## 2023-06-28 DIAGNOSIS — R519 Headache, unspecified: Secondary | ICD-10-CM | POA: Diagnosis not present

## 2023-06-28 DIAGNOSIS — R42 Dizziness and giddiness: Secondary | ICD-10-CM | POA: Diagnosis not present

## 2023-06-28 DIAGNOSIS — M542 Cervicalgia: Secondary | ICD-10-CM | POA: Diagnosis not present

## 2023-07-02 DIAGNOSIS — M542 Cervicalgia: Secondary | ICD-10-CM | POA: Diagnosis not present

## 2023-07-02 DIAGNOSIS — R42 Dizziness and giddiness: Secondary | ICD-10-CM | POA: Diagnosis not present

## 2023-07-02 DIAGNOSIS — R519 Headache, unspecified: Secondary | ICD-10-CM | POA: Diagnosis not present

## 2023-07-04 DIAGNOSIS — F331 Major depressive disorder, recurrent, moderate: Secondary | ICD-10-CM | POA: Diagnosis not present

## 2023-07-04 DIAGNOSIS — F419 Anxiety disorder, unspecified: Secondary | ICD-10-CM | POA: Diagnosis not present

## 2023-07-16 ENCOUNTER — Encounter: Payer: Self-pay | Admitting: Physician Assistant

## 2023-07-16 ENCOUNTER — Ambulatory Visit: Admitting: Physician Assistant

## 2023-07-16 ENCOUNTER — Other Ambulatory Visit (HOSPITAL_BASED_OUTPATIENT_CLINIC_OR_DEPARTMENT_OTHER): Payer: Self-pay

## 2023-07-16 ENCOUNTER — Other Ambulatory Visit: Payer: Self-pay

## 2023-07-16 ENCOUNTER — Telehealth: Payer: Self-pay | Admitting: *Deleted

## 2023-07-16 VITALS — BP 110/80 | HR 64 | Temp 98.1°F | Ht 66.0 in | Wt 204.0 lb

## 2023-07-16 DIAGNOSIS — F32A Depression, unspecified: Secondary | ICD-10-CM

## 2023-07-16 DIAGNOSIS — F419 Anxiety disorder, unspecified: Secondary | ICD-10-CM | POA: Diagnosis not present

## 2023-07-16 DIAGNOSIS — G43109 Migraine with aura, not intractable, without status migrainosus: Secondary | ICD-10-CM

## 2023-07-16 DIAGNOSIS — E669 Obesity, unspecified: Secondary | ICD-10-CM

## 2023-07-16 MED ORDER — IBUPROFEN 800 MG PO TABS
800.0000 mg | ORAL_TABLET | Freq: Three times a day (TID) | ORAL | 1 refills | Status: DC | PRN
  Filled 2023-07-16: qty 30, 10d supply, fill #0

## 2023-07-16 MED ORDER — PROPRANOLOL HCL 10 MG PO TABS
10.0000 mg | ORAL_TABLET | Freq: Two times a day (BID) | ORAL | 1 refills | Status: DC | PRN
  Filled 2023-07-16: qty 180, 90d supply, fill #0
  Filled 2023-10-16: qty 180, 90d supply, fill #1

## 2023-07-16 MED ORDER — FLUOXETINE HCL 10 MG PO CAPS
10.0000 mg | ORAL_CAPSULE | Freq: Every day | ORAL | 3 refills | Status: DC
  Filled 2023-07-16: qty 90, 90d supply, fill #0
  Filled 2023-10-16: qty 90, 90d supply, fill #1

## 2023-07-16 MED ORDER — ZEPBOUND 2.5 MG/0.5ML ~~LOC~~ SOAJ
2.5000 mg | SUBCUTANEOUS | 0 refills | Status: DC
  Filled 2023-07-16: qty 2, 28d supply, fill #0

## 2023-07-16 MED ORDER — FLUOXETINE HCL 20 MG PO CAPS
20.0000 mg | ORAL_CAPSULE | Freq: Every day | ORAL | 3 refills | Status: DC
  Filled 2023-07-16: qty 90, 90d supply, fill #0
  Filled 2023-10-16: qty 90, 90d supply, fill #1

## 2023-07-16 NOTE — Telephone Encounter (Signed)
 Please do PA for Zepbound 2.5 mg weekly injection.

## 2023-07-16 NOTE — Progress Notes (Signed)
 Shirley Fleming is a 48 y.o. female here for an office visit.  History of Present Illness:   Chief Complaint  Patient presents with   Weight Management Screening    Pt is here to discuss weight loss options.    HPI  Weight management: Has struggles with weight loss despite her lifestyle changes with healthy diet and regular exercise.  Does not drink sodas or sugar beverages regularly.  Reports a history of constipation.  Takes 2 stool softeners, a fiber supplement, and drinks plenty of water per day.  On her current regimen she is having regular bowel movements, at least once daily.  She notes that she dislikes Miralax.   Migraines: Has a migraine about 1x per month.  Pt has been taking ibuprofen as needed.  She tends to take Ibuprofen at the first sign of a migraine.  She is requesting a refill today.  No acute concerns reported.   Moods: She is on propranolol 10 mg BID and fluoxetine 30 mg once daily.  Good compliance and tolerance.  She is established with a therapist.  Moods are stable today.   Past Medical History:  Diagnosis Date   Anemia    takes iron- heavy menstral cycle   Anxiety    on meds   Asthma    uses inhaler with exercise; Follows w/ PCP.   Depression 1995   Off and on since teens   Empty sella (HCC)    Partially empty sella per MR brain 05/07/22, Follows w/ neurololgy.   GERD (gastroesophageal reflux disease)    OTC PRN meds   History of COVID-19 12/23/2020   Hypertension Jan 2021   resolved per pt, no meds   Hypoglycemia    Inflamed seborrheic keratosis 2024   Follows w/ [facility].   Migraine with aura    Follows w/ Dr. Jacquelyne Balint, neurologist. See 05/07/22 MRI of brain in Epic.   Palpitations    after having Covid, takes Propranolol prn, saw cardiologist, Dr. Rollene Rotunda, 06/17/20 Long term monitor in Epic   Pneumonia    mother was a smoker, pt states growing up she had pneumonia and bronchitis several times   Seasonal  allergies    Wears glasses    for driving only     Social History   Tobacco Use   Smoking status: Former    Current packs/day: 0.00    Types: Cigarettes    Start date: 04/24/1990    Quit date: 04/24/2005    Years since quitting: 18.2    Passive exposure: Past   Smokeless tobacco: Never  Vaping Use   Vaping status: Never Used  Substance Use Topics   Alcohol use: Yes    Alcohol/week: 2.0 standard drinks of alcohol    Types: 2 Glasses of wine per week   Drug use: Never    Past Surgical History:  Procedure Laterality Date   APPENDECTOMY  2003   COLONOSCOPY  02/09/2023   1 - 4mm polyp   CYSTOSCOPY N/A 03/09/2023   Procedure: CYSTOSCOPY;  Surgeon: Earley Favor, MD;  Location: [facility];  Service: Gynecology;  Laterality: N/A;   ROBOTIC ASSISTED LAPAROSCOPIC HYSTERECTOMY AND SALPINGECTOMY Bilateral 03/09/2023   Procedure: XI ROBOTIC ASSISTED LAPAROSCOPIC HYSTERECTOMY AND SALPINGECTOMY;  Surgeon: Earley Favor, MD;  Location: [facility];  Service: Gynecology;  Laterality: Bilateral;   TONSILLECTOMY AND ADENOIDECTOMY  1993    Family History  Problem Relation Age of Onset   Irritable bowel syndrome  Mother    COPD Mother    Depression Mother    Heart attack Father 69       x 2   Hypertension Father    Heart disease Father    Hypothyroidism Brother    Other Brother        sepsis from knee surgery   Breast cancer Maternal Aunt        dx mid 76s   Diabetes Maternal Aunt    Hypertension Maternal Aunt    Cancer Maternal Aunt    Depression Maternal Aunt    Hypertension Maternal Aunt    Breast cancer Maternal Grandmother        dx 30s, d. 30s   Cancer Maternal Grandmother    Heart attack Maternal Grandfather    Heart disease Maternal Grandfather    Breast cancer Paternal Grandmother        dx 9s; + vaginal cancer 60s, jaw cancer 52s   Cancer Paternal Grandmother    Heart attack Paternal Grandfather    OCD Daughter    Anxiety  disorder Daughter    ADD / ADHD Son    Other Son        hypermobile EDS   Autism Son    Colon polyps Neg Hx    Colon cancer Neg Hx    Esophageal cancer Neg Hx    Stomach cancer Neg Hx    Rectal cancer Neg Hx     Allergies  Allergen Reactions   Doxycycline Nausea Only   Erythromycin Nausea And Vomiting    Current Medications:   Current Outpatient Medications:    albuterol (VENTOLIN HFA) 108 (90 Base) MCG/ACT inhaler, Inhale 1-2 puffs into the lungs every 6 (six) hours as needed., Disp: 6.7 g, Rfl: 2   ALPRAZolam (XANAX) 0.25 MG tablet, Take 1 tablet (0.25 mg total) by mouth every 8 (eight) hours as needed., Disp: 30 tablet, Rfl: 2   cholecalciferol (VITAMIN D3) 25 MCG (1000 UT) tablet, Take 1,000 Units by mouth daily., Disp: , Rfl:    FLUoxetine (PROZAC) 10 MG capsule, Take 1 capsule (10 mg total) by mouth daily., Disp: 90 capsule, Rfl: 3   fluticasone (FLONASE) 50 MCG/ACT nasal spray, Place 1 spray into both nostrils as needed., Disp: , Rfl:    Magnesium 250 MG TABS, Take 250 mg by mouth at bedtime., Disp: , Rfl:    meclizine (ANTIVERT) 25 MG tablet, Take 0.5-1 tablets (12.5-25 mg total) by mouth 3 (three) times daily as needed for dizziness., Disp: 30 tablet, Rfl: 0   Multiple Vitamin (MULTIVITAMIN) tablet, Take 1 tablet by mouth daily., Disp: , Rfl:    ondansetron (ZOFRAN-ODT) 4 MG disintegrating tablet, Take 1 tablet (4 mg total) by mouth every 8 (eight) hours as needed for nausea or vomiting., Disp: 20 tablet, Rfl: 0   Saline (SIMPLY SALINE) 0.9 % AERS, Place 2 each into the nose as directed. Use nightly for sinus hygiene long-term.  Can also be used as many times daily as desired to assist with clearing congested sinuses., Disp: 127 mL, Rfl: 11   SUMAtriptan (IMITREX) 100 MG tablet, Take 1 tablet (100 mg total) by mouth once as needed for up to 1 dose for migraine. May repeat in 2 hours if headache persists or recurs., Disp: 10 tablet, Rfl: 11   tirzepatide (ZEPBOUND) 2.5  MG/0.5ML Pen, Inject 2.5 mg into the skin once a week., Disp: 2 mL, Rfl: 0   UNABLE TO FIND, Med Name: fiber gummy takes 2, Disp: ,  Rfl:    ferrous sulfate 324 MG TBEC, Take 65 mg by mouth daily with breakfast. (Patient not taking: Reported on 07/16/2023), Disp: , Rfl:    FLUoxetine (PROZAC) 20 MG capsule, Take 1 capsule (20 mg total) by mouth daily. To be taken with 10 mg daily for a total of 30 mg., Disp: 90 capsule, Rfl: 3   ibuprofen (ADVIL) 800 MG tablet, Take 1 tablet (800 mg total) by mouth every 8 (eight) hours as needed., Disp: 30 tablet, Rfl: 1   propranolol (INDERAL) 10 MG tablet, Take 1 tablet (10 mg total) by mouth every 12 (twelve) hours as needed., Disp: 180 tablet, Rfl: 1   Review of Systems:   Negative unless otherwise specified per HPI.  Vitals:   Vitals:   07/16/23 1142  BP: 110/80  Pulse: 64  Temp: 98.1 F (36.7 C)  TempSrc: Temporal  SpO2: 96%  Weight: 204 lb (92.5 kg)  Height: 5\' 6"  (1.676 m)     Body mass index is 32.93 kg/m.  Physical Exam:   Physical Exam Vitals and nursing note reviewed.  Constitutional:      General: She is not in acute distress.    Appearance: She is well-developed. She is not ill-appearing or toxic-appearing.  Cardiovascular:     Rate and Rhythm: Normal rate and regular rhythm.     Pulses: Normal pulses.     Heart sounds: Normal heart sounds, S1 normal and S2 normal.  Pulmonary:     Effort: Pulmonary effort is normal.     Breath sounds: Normal breath sounds.  Skin:    General: Skin is warm and dry.  Neurological:     Mental Status: She is alert.     GCS: GCS eye subscore is 4. GCS verbal subscore is 5. GCS motor subscore is 6.  Psychiatric:        Speech: Speech normal.        Behavior: Behavior normal. Behavior is cooperative.     Assessment and Plan:   Anxiety (Primary); Depression, unspecified depression type Well controlled Continue Prozac 30 mg daily Follow up in 6 month(s), sooner if concerns  Migraine with  aura and without status migrainosus, not intractable Overall stable per patient Refill ibuprofen for as needed use, per patient request Close follow up with Dr Loleta Chance (neurology) if persists  Obesity, unspecified class, unspecified obesity type, unspecified whether serious comorbidity present Ongoing Reviewed risks and benefits/side effect(s) of medication Will start Zepbound 2.5 mg weekly Follow up in 3 month(s), sooner if concerns   I, Isabelle Course, acting as a Neurosurgeon for Jarold Motto, [place]., have documented all relevant documentation on the behalf of Jarold Motto, [place], as directed by  Jarold Motto, PA while in the presence of Jarold Motto, [place].  I, Jarold Motto, [place], have reviewed all documentation for this visit. The documentation on 07/16/23 for the exam, diagnosis, procedures, and orders are all accurate and complete.  Jarold Motto, PA-C

## 2023-07-17 ENCOUNTER — Other Ambulatory Visit (HOSPITAL_COMMUNITY): Payer: Self-pay

## 2023-07-17 ENCOUNTER — Telehealth: Payer: Self-pay

## 2023-07-17 DIAGNOSIS — F331 Major depressive disorder, recurrent, moderate: Secondary | ICD-10-CM | POA: Diagnosis not present

## 2023-07-17 DIAGNOSIS — F419 Anxiety disorder, unspecified: Secondary | ICD-10-CM | POA: Diagnosis not present

## 2023-07-17 NOTE — Telephone Encounter (Signed)
 Pharmacy Patient Advocate Encounter  Received notification from [facility] that Prior Authorization for Zepbound 2.5mg /0.38ml has been DENIED.  Drug is not covered by plan.

## 2023-07-17 NOTE — Telephone Encounter (Signed)
 Pharmacy Patient Advocate Encounter   Received notification from Pt Calls Messages that prior authorization for Zepbound 2.5mg /0.22ml is required/requested.   Insurance verification completed.   The patient is insured through [employer] .   Per test claim: PA required; PA submitted to above mentioned insurance via CoverMyMeds Key/confirmation #/EOC [id number] Status is pending

## 2023-07-18 MED ORDER — TIRZEPATIDE-WEIGHT MANAGEMENT 2.5 MG/0.5ML ~~LOC~~ SOLN: 2.5000 mg | SUBCUTANEOUS | 0 refills | Status: DC

## 2023-07-18 NOTE — Telephone Encounter (Signed)
 Spoke to pt told her Zepbound has been denied by your insurance, drug not covered under your plan. Pt verbalized understanding and said can we send to [facility]. Told her I will send Rx there and someone will be in touch with you. Pt verbalized understanding.

## 2023-07-18 NOTE — Addendum Note (Signed)
 Addended by: Jimmye Norman on: 07/18/2023 11:57 AM   Modules accepted: Orders

## 2023-07-31 DIAGNOSIS — F331 Major depressive disorder, recurrent, moderate: Secondary | ICD-10-CM | POA: Diagnosis not present

## 2023-07-31 DIAGNOSIS — F419 Anxiety disorder, unspecified: Secondary | ICD-10-CM | POA: Diagnosis not present

## 2023-08-10 ENCOUNTER — Encounter: Payer: Self-pay | Admitting: Physician Assistant

## 2023-08-10 ENCOUNTER — Other Ambulatory Visit: Payer: Self-pay | Admitting: Physician Assistant

## 2023-08-10 MED ORDER — TIRZEPATIDE-WEIGHT MANAGEMENT 2.5 MG/0.5ML ~~LOC~~ SOLN
2.5000 mg | SUBCUTANEOUS | 2 refills | Status: DC
Start: 2023-08-10 — End: 2023-10-16

## 2023-08-14 DIAGNOSIS — F331 Major depressive disorder, recurrent, moderate: Secondary | ICD-10-CM | POA: Diagnosis not present

## 2023-08-14 DIAGNOSIS — F419 Anxiety disorder, unspecified: Secondary | ICD-10-CM | POA: Diagnosis not present

## 2023-08-22 ENCOUNTER — Other Ambulatory Visit (HOSPITAL_BASED_OUTPATIENT_CLINIC_OR_DEPARTMENT_OTHER): Payer: Self-pay

## 2023-08-22 ENCOUNTER — Encounter: Payer: Self-pay | Admitting: Physician Assistant

## 2023-08-22 ENCOUNTER — Ambulatory Visit: Admitting: Physician Assistant

## 2023-08-22 VITALS — BP 110/80 | HR 67 | Temp 97.5°F | Ht 66.0 in | Wt 194.0 lb

## 2023-08-22 DIAGNOSIS — E669 Obesity, unspecified: Secondary | ICD-10-CM | POA: Diagnosis not present

## 2023-08-22 DIAGNOSIS — H9201 Otalgia, right ear: Secondary | ICD-10-CM | POA: Diagnosis not present

## 2023-08-22 MED ORDER — PREDNISONE 20 MG PO TABS
20.0000 mg | ORAL_TABLET | Freq: Two times a day (BID) | ORAL | 0 refills | Status: DC
  Filled 2023-08-22: qty 10, 5d supply, fill #0

## 2023-08-22 NOTE — Progress Notes (Signed)
 Shirley Fleming is a 48 y.o. female here for a new problem.  History of Present Illness:   Chief Complaint  Patient presents with   Sinus Problem    Pt c/o sinus pressure, headache, right ear pain, started a week ago.   Sinus Problem Pt complains of sinus pressure, headaches, right ear pain and itchiness, and ear swelling, starting Wednesday 4/23.  On Friday 4/25 she travelled to [place] and her symptoms worsened.  She states her ear and face was swollen and had to avoid lying down on her right side.  By Friday night she was in severe pain and was prescribed Augmentin .  She was prescribed Augmentin  BID for 7 days and started taking Ibuprofen  800 mg to help her symptoms.  She has since stopped Ibuprofen .  Her symptoms overall improved, though she does report clear discharge and blood from her right ear on Monday.  She has not been taking anti-histamines, stating they make her "dry everywhere".  Denies any fever, nasal bleeding, or puss discharge from her ear.   Weight management Pt is on Zepbound  2.5 mg once weekly.  She has noticed appetite changes and at times struggles to eat dinner with her family due to decreased appetite.  Overall she is tolerating Zepbound  well with no side effects.   Past Medical History:  Diagnosis Date   Anemia    takes iron- heavy menstral cycle   Anxiety    on meds   Asthma    uses inhaler with exercise; Follows w/ PCP.   Depression 1995   Off and on since teens   Empty sella (HCC)    Partially empty sella per MR brain 05/07/22, Follows w/ neurololgy.   GERD (gastroesophageal reflux disease)    OTC PRN meds   History of COVID-19 12/23/2020   Hypertension Jan 2021   resolved per pt, no meds   Hypoglycemia    Inflamed seborrheic keratosis 2024   Follows w/ [facility].   Migraine with aura    Follows w/ Dr. Rommie Coats, neurologist. See 05/07/22 MRI of brain in Epic.   Palpitations    after having Covid, takes Propranolol  prn, saw  cardiologist, Dr. Eilleen Grates, 06/17/20 Long term monitor in Epic   Pneumonia    mother was a smoker, pt states growing up she had pneumonia and bronchitis several times   Seasonal allergies    Wears glasses    for driving only     Social History   Tobacco Use   Smoking status: Former    Current packs/day: 0.00    Types: Cigarettes    Start date: 04/24/1990    Quit date: 04/24/2005    Years since quitting: 18.3    Passive exposure: Past   Smokeless tobacco: Never  Vaping Use   Vaping status: Never Used  Substance Use Topics   Alcohol use: Yes    Alcohol/week: 2.0 standard drinks of alcohol    Types: 2 Glasses of wine per week   Drug use: Never    Past Surgical History:  Procedure Laterality Date   APPENDECTOMY  2003   COLONOSCOPY  02/09/2023   1 - 4mm polyp   CYSTOSCOPY N/A 03/09/2023   Procedure: CYSTOSCOPY;  Surgeon: Reinaldo Caras, MD;  Location: [facility];  Service: Gynecology;  Laterality: N/A;   ROBOTIC ASSISTED LAPAROSCOPIC HYSTERECTOMY AND SALPINGECTOMY Bilateral 03/09/2023   Procedure: XI ROBOTIC ASSISTED LAPAROSCOPIC HYSTERECTOMY AND SALPINGECTOMY;  Surgeon: Reinaldo Caras, MD;  Location: [facility];  Service: Gynecology;  Laterality: Bilateral;   TONSILLECTOMY AND ADENOIDECTOMY  1993    Family History  Problem Relation Age of Onset   Irritable bowel syndrome Mother    COPD Mother    Depression Mother    Heart attack Father 61       x 2   Hypertension Father    Heart disease Father    Hypothyroidism Brother    Other Brother        sepsis from knee surgery   Breast cancer Maternal Aunt        dx mid 34s   Diabetes Maternal Aunt    Hypertension Maternal Aunt    Cancer Maternal Aunt    Depression Maternal Aunt    Hypertension Maternal Aunt    Breast cancer Maternal Grandmother        dx 30s, d. 30s   Cancer Maternal Grandmother    Heart attack Maternal Grandfather    Heart disease Maternal Grandfather     Breast cancer Paternal Grandmother        dx 43s; + vaginal cancer 60s, jaw cancer 89s   Cancer Paternal Grandmother    Heart attack Paternal Grandfather    OCD Daughter    Anxiety disorder Daughter    ADD / ADHD Son    Other Son        hypermobile EDS   Autism Son    Colon polyps Neg Hx    Colon cancer Neg Hx    Esophageal cancer Neg Hx    Stomach cancer Neg Hx    Rectal cancer Neg Hx     Allergies  Allergen Reactions   Doxycycline  Nausea Only   Erythromycin Nausea And Vomiting    Current Medications:   Current Outpatient Medications:    albuterol  (VENTOLIN  HFA) 108 (90 Base) MCG/ACT inhaler, Inhale 1-2 puffs into the lungs every 6 (six) hours as needed., Disp: 6.7 g, Rfl: 2   ALPRAZolam  (XANAX ) 0.25 MG tablet, Take 1 tablet (0.25 mg total) by mouth every 8 (eight) hours as needed., Disp: 30 tablet, Rfl: 2   amoxicillin -clavulanate (AUGMENTIN ) 875-125 MG tablet, Take 1 tablet by mouth 2 (two) times daily., Disp: , Rfl:    cholecalciferol (VITAMIN D3) 25 MCG (1000 UT) tablet, Take 1,000 Units by mouth daily., Disp: , Rfl:    FLUoxetine  (PROZAC ) 10 MG capsule, Take 1 capsule (10 mg total) by mouth daily., Disp: 90 capsule, Rfl: 3   FLUoxetine  (PROZAC ) 20 MG capsule, Take 1 capsule (20 mg total) by mouth daily. To be taken with 10 mg daily for a total of 30 mg., Disp: 90 capsule, Rfl: 3   fluticasone (FLONASE) 50 MCG/ACT nasal spray, Place 1 spray into both nostrils as needed., Disp: , Rfl:    ibuprofen  (ADVIL ) 800 MG tablet, Take 1 tablet (800 mg total) by mouth every 8 (eight) hours as needed., Disp: 30 tablet, Rfl: 1   meclizine  (ANTIVERT ) 25 MG tablet, Take 0.5-1 tablets (12.5-25 mg total) by mouth 3 (three) times daily as needed for dizziness., Disp: 30 tablet, Rfl: 0   Multiple Vitamin (MULTIVITAMIN) tablet, Take 1 tablet by mouth daily., Disp: , Rfl:    ondansetron  (ZOFRAN -ODT) 4 MG disintegrating tablet, Take 1 tablet (4 mg total) by mouth every 8 (eight) hours as needed for  nausea or vomiting., Disp: 20 tablet, Rfl: 0   predniSONE  (DELTASONE ) 20 MG tablet, Take 1 tablet (20 mg total) by mouth 2 (two) times daily with a meal., Disp: 10  tablet, Rfl: 0   propranolol  (INDERAL ) 10 MG tablet, Take 1 tablet (10 mg total) by mouth every 12 (twelve) hours as needed., Disp: 180 tablet, Rfl: 1   Saline (SIMPLY SALINE) 0.9 % AERS, Place 2 each into the nose as directed. Use nightly for sinus hygiene long-term.  Can also be used as many times daily as desired to assist with clearing congested sinuses., Disp: 127 mL, Rfl: 11   SUMAtriptan  (IMITREX ) 100 MG tablet, Take 1 tablet (100 mg total) by mouth once as needed for up to 1 dose for migraine. May repeat in 2 hours if headache persists or recurs., Disp: 10 tablet, Rfl: 11   tirzepatide  (ZEPBOUND ) 2.5 MG/0.5ML injection vial, Inject 2.5 mg into the skin once a week., Disp: 2 mL, Rfl: 2   UNABLE TO FIND, Med Name: fiber gummy takes 2, Disp: , Rfl:    Review of Systems:   Negative unless otherwise specified per HPI.  Vitals:   Vitals:   08/22/23 1307  BP: 110/80  Pulse: 67  Temp: (!) 97.5 F (36.4 C)  TempSrc: Temporal  SpO2: 97%  Weight: 194 lb (88 kg)  Height: 5\' 6"  (1.676 m)     Body mass index is 31.31 kg/m.  Physical Exam:   Physical Exam Vitals and nursing note reviewed.  Constitutional:      General: She is not in acute distress.    Appearance: She is well-developed. She is not ill-appearing or toxic-appearing.  HENT:     Head: Normocephalic and atraumatic.     Right Ear: Ear canal and external ear normal. Laceration (slight bleeding present) present. No mastoid tenderness. Tympanic membrane is erythematous. Tympanic membrane is not retracted or bulging.     Left Ear: Tympanic membrane, ear canal and external ear normal. No mastoid tenderness. Tympanic membrane is not erythematous, retracted or bulging.     Nose: Nose normal.     Right Sinus: No maxillary sinus tenderness or frontal sinus tenderness.      Left Sinus: No maxillary sinus tenderness or frontal sinus tenderness.     Mouth/Throat:     Pharynx: Uvula midline. No posterior oropharyngeal erythema.  Eyes:     General: Lids are normal.     Conjunctiva/sclera: Conjunctivae normal.  Neck:     Trachea: Trachea normal.  Cardiovascular:     Rate and Rhythm: Normal rate and regular rhythm.     Heart sounds: Normal heart sounds, S1 normal and S2 normal.  Pulmonary:     Effort: Pulmonary effort is normal.     Breath sounds: Normal breath sounds. No decreased breath sounds, wheezing, rhonchi or rales.  Lymphadenopathy:     Cervical: No cervical adenopathy.  Skin:    General: Skin is warm and dry.  Neurological:     Mental Status: She is alert.  Psychiatric:        Speech: Speech normal.        Behavior: Behavior normal. Behavior is cooperative.     Assessment and Plan:   Right ear pain (Primary) No red flags on exam.   Recommend oral prednisone  per orders Continue Augmentin   Consider benadryl at night to help with allergy type symptom(s) and itching Discussed taking medications as prescribed.  Reviewed return precautions including new or worsening fever, SOB, new or worsening cough or other concerns.  Push fluids and rest.  I recommend that patient follow-up if symptoms worsen or persist despite treatment x 7-10 days, sooner if needed.  Obesity, unspecified class, unspecified  obesity type, unspecified whether serious comorbidity present Doing well with Zepbound  She would like to continue the current dose as prescribed Follow up in 3-6 month(s), sooner if concerns  I, Bernita Bristle, acting as a Neurosurgeon for Alexander Iba, [place]., have documented all relevant documentation on the behalf of Alexander Iba, [place], as directed by   while in the presence of Alexander Iba, [place].  I, Alexander Iba, [place], have reviewed all documentation for this visit. The documentation on 08/22/23 for the exam, diagnosis, procedures, and orders are all  accurate and complete.  Alexander Iba, PA-C

## 2023-08-24 ENCOUNTER — Other Ambulatory Visit (HOSPITAL_BASED_OUTPATIENT_CLINIC_OR_DEPARTMENT_OTHER): Payer: Self-pay

## 2023-08-28 DIAGNOSIS — F419 Anxiety disorder, unspecified: Secondary | ICD-10-CM | POA: Diagnosis not present

## 2023-08-28 DIAGNOSIS — F331 Major depressive disorder, recurrent, moderate: Secondary | ICD-10-CM | POA: Diagnosis not present

## 2023-09-11 NOTE — Progress Notes (Unsigned)
 NEUROLOGY FOLLOW UP OFFICE NOTE  Shirley Fleming [id number]  Subjective:  Shirley Fleming is a 48 y.o. year old right-handed female with a medical history of HTN, migraine, asthma, depression who we last saw on 03/16/23 for migraines.  To briefly review: Initial consult (04/05/22): Patient has pain in and around right ear and along jaw when chewing that started 01/23/22 while traveling. Her symptoms have mostly improved. She had a work up included a maxillofacial CT which showed an incidental partially empty sella. Given her pressure headaches and potential concern for IIH, patient was referred to neurology.   Patient has seen ENT. He thinks patient has inner ear problems (?pressure).   In terms of headaches, she has a history of migraines. She had sinus headaches as a child. She started getting migraines after having her son. She has pressure in her head, usually one sided, that will switch after a 18 hours and continue for 18 hours on the Fleming side. She endorses photophobia, phonophobia, nausea. She also is sensitive to smells. She would normally get the headaches with her cycle. Of late, it has been more random, maybe none for 2 months, then maybe 2 per month. They typically last from 1-3 days. She can have head pain. She can take ibuprofen  that sometimes helps. She has never tried a migraine medication. She will lay down in a dark room and lay on the side of the headache for help. Her husband may also rub her neck and give some relief. She can wake up with headaches. Getting up and moving can help. Flonase can also help.   She endorses vision changes for about 1 year ago. She had COVID around that time. She has noticed her vision is worse. She will feel like sometimes one eye won't see correctly. She will blink and wait and things will get better. She was seen by her eye doctor who dilated her eyes and said that looked okay, but that she needed new prescription. She feels like things are not as  bright as usual as well, but can be too bright when having a migraine.   Patient is on B-complex and multivitamin. She is on propranolol  as needed due to occasional tachycardia after COVID. She takes Prozac  for depression and xanax  as needed.   EtOH: 1 glass of wine at night Caffeine: 2-3 glasses of coffee per day    06/07/22: Labs were significant for elevated B vitamins including B6. I recommended she stop any supplements, which she did.    MRI brain on 05/07/21 again showed a partially empty sella but was otherwise remarkable. She had a lumbar puncture on 05/24/22 with an opening pressure of 17 cm of water . Routine analysis was normal. She was headache free for a few days. The headaches returned but did not have a positional component to them. She thinks the vision might have been a little better for a few days as well.   She had low back pain and nausea for a couple of weeks after LP.   Since 03/2022 visit, patient has had 3-4 headaches. They tend to cluster on one side of the face at a time, but can be either side. She has nausea, photophobia, and phonophobia. She is taking propranolol , but this has not helped with headaches. She will take ibuprofen  for headaches, which sometimes helps.   She does not have a history of kidney stones.   07/25/22: Patient was not able to tolerate Topamax  (feeling off, body aches, URI like symptoms). She  weaned off starting 07/20/22. She stated her body temp was 95.63F despite everyone else in her family being normal. The next day after stopping, her symptoms went away. She was also very fatigued, which improved after stopping the medication.    Topamax  did help with headaches. She had one day of non-debilitating headache while on it. She had improvement of neck pain while on Topamax  as well.   She has "wave like feeling" of a headache coming on, but then going away. It is not with changing position though. Laying down does not change symptoms. She had one day of  aura with visual spots. She usually has headaches that cluster, particularly around her menstrual cycle.    Of note, patient currently takes Prozac  20 mg daily for depression and propranolol  10 mg daily for racing heart.   She did PT for her neck in the past, but many years ago.   09/13/22: Since last visit, patient has had 2 headaches. She had one really bad headache and took Imitrex , but the headache did not respond. The headache lasted 24-48 hours. She had another headache while out of town that she managed with ibuprofen . She is still more likely to get a headache around her cycle.   She continues to have neck pain as well.  03/16/23: Patient has had some additional improvement with increasing propranolol  to 10 mg BID. She has been taking ibuprofen  for headaches, which has been working. She has not taken Sumatriptan . She is currently getting about 1 headache per month that lasts a day or two. She thinks headaches are milder when she does get one. She has had 2 bad headaches. One was while prepping for colonoscopy and one was at altitude when in [place]. She continues to have some neck pain and headaches and noticed with one headache that an ice pack on her neck help a lot.    Patient had a hysterectomy on 03/09/23, so does not know how this will affect her headaches.   Current medications: Propranolol  10 mg BID, has sumatriptan  but using ibuprofen  Side effects: None  Most recent Assessment and Plan (03/16/23): This is Shirley Fleming, a 48 y.o. female with episodic migraine with aura - initial concern for positional component of HA so IIH was concerned. LP showed normal opening pressure. She also has significant neck pain that contributes to headaches. Patient could not tolerate Topamax , so this was weaned off ~07/20/22. Patient currently on propranolol  10 mg BID having about 1 headache per month around her menstrual cycle. She had a hysterectomy on 03/09/23.    Plan: For migraines: Migraine  prevention:  Continue propranolol  10 mg BID Migraine rescue:  Continue sumatriptan  100 mg PRN or ibuprofen  PRN Limit use of pain relievers to no more than 2 days out of week to prevent risk of rebound or medication-overuse headache. Keep headache diary   PT for neck pain  Since their last visit: Patient went to PT for neck. She felt this really helped. She had cupping which she really liked. She still does the exercises.  She is still only getting about 1 headache per month. She tends to like ibuprofen  more than sumatriptan  for her headaches. She had one bad headache last week that may have been related to weather or lack of sleep. She has had 2 severe headaches in the last month. The headache can last a few days at times.   Current medications: -Propranolol  10 mg BID -Sumatriptan  or ibuprofen  as needed (~3 times per week)  Side  effects: Sumatriptan  may make her nauseated.  Patient wanted me to note that she has hypermobility and that her son was diagnosed with EDS.  MEDICATIONS:  Outpatient Encounter Medications as of 09/19/2023  Medication Sig   albuterol  (VENTOLIN  HFA) 108 (90 Base) MCG/ACT inhaler Inhale 1-2 puffs into the lungs every 6 (six) hours as needed.   ALPRAZolam  (XANAX ) 0.25 MG tablet Take 1 tablet (0.25 mg total) by mouth every 8 (eight) hours as needed.   cholecalciferol (VITAMIN D3) 25 MCG (1000 UT) tablet Take 1,000 Units by mouth daily.   FLUoxetine  (PROZAC ) 10 MG capsule Take 1 capsule (10 mg total) by mouth daily.   FLUoxetine  (PROZAC ) 20 MG capsule Take 1 capsule (20 mg total) by mouth daily. To be taken with 10 mg daily for a total of 30 mg.   fluticasone (FLONASE) 50 MCG/ACT nasal spray Place 1 spray into both nostrils as needed.   ibuprofen  (ADVIL ) 800 MG tablet Take 1 tablet (800 mg total) by mouth every 8 (eight) hours as needed.   meclizine  (ANTIVERT ) 25 MG tablet Take 0.5-1 tablets (12.5-25 mg total) by mouth 3 (three) times daily as needed for dizziness.    Multiple Vitamin (MULTIVITAMIN) tablet Take 1 tablet by mouth daily.   ondansetron  (ZOFRAN -ODT) 4 MG disintegrating tablet Take 1 tablet (4 mg total) by mouth every 8 (eight) hours as needed for nausea or vomiting.   propranolol  (INDERAL ) 10 MG tablet Take 1 tablet (10 mg total) by mouth every 12 (twelve) hours as needed.   rizatriptan (MAXALT) 10 MG tablet Take 1 tablet (10 mg total) by mouth as needed for migraine. May repeat in 2 hours if needed   Saline (SIMPLY SALINE) 0.9 % AERS Place 2 each into the nose as directed. Use nightly for sinus hygiene long-term.  Can also be used as many times daily as desired to assist with clearing congested sinuses.   [DISCONTINUED] SUMAtriptan  (IMITREX ) 100 MG tablet Take 1 tablet (100 mg total) by mouth once as needed for up to 1 dose for migraine. May repeat in 2 hours if headache persists or recurs.   tirzepatide  (ZEPBOUND ) 2.5 MG/0.5ML injection vial Inject 2.5 mg into the skin once a week.   UNABLE TO FIND Med Name: fiber gummy takes 2   [DISCONTINUED] amoxicillin -clavulanate (AUGMENTIN ) 875-125 MG tablet Take 1 tablet by mouth 2 (two) times daily. (Patient not taking: Reported on 09/19/2023)   [DISCONTINUED] predniSONE  (DELTASONE ) 20 MG tablet Take 1 tablet (20 mg total) by mouth 2 (two) times daily with a meal. (Patient not taking: Reported on 09/19/2023)   No facility-administered encounter medications on file as of 09/19/2023.    PAST MEDICAL HISTORY: Past Medical History:  Diagnosis Date   Anemia    takes iron- heavy menstral cycle   Anxiety    on meds   Asthma    uses inhaler with exercise; Follows w/ PCP.   Depression 1995   Off and on since teens   Empty sella (HCC)    Partially empty sella per MR brain 05/07/22, Follows w/ neurololgy.   GERD (gastroesophageal reflux disease)    OTC PRN meds   History of COVID-19 12/23/2020   Hypertension Jan 2021   resolved per pt, no meds   Hypoglycemia    Inflamed seborrheic keratosis 2024   Follows  w/ [facility].   Migraine with aura    Follows w/ Dr. Rommie Coats, neurologist. See 05/07/22 MRI of brain in Epic.   Palpitations  after having Covid, takes Propranolol  prn, saw cardiologist, Dr. Eilleen Grates, 06/17/20 Long term monitor in Epic   Pneumonia    mother was a smoker, pt states growing up she had pneumonia and bronchitis several times   Seasonal allergies    Wears glasses    for driving only    PAST SURGICAL HISTORY: Past Surgical History:  Procedure Laterality Date   APPENDECTOMY  2003   COLONOSCOPY  02/09/2023   1 - 4mm polyp   CYSTOSCOPY N/A 03/09/2023   Procedure: CYSTOSCOPY;  Surgeon: Reinaldo Caras, MD;  Location: [facility];  Service: Gynecology;  Laterality: N/A;   ROBOTIC ASSISTED LAPAROSCOPIC HYSTERECTOMY AND SALPINGECTOMY Bilateral 03/09/2023   Procedure: XI ROBOTIC ASSISTED LAPAROSCOPIC HYSTERECTOMY AND SALPINGECTOMY;  Surgeon: Reinaldo Caras, MD;  Location: [facility];  Service: Gynecology;  Laterality: Bilateral;   TONSILLECTOMY AND ADENOIDECTOMY  1993    ALLERGIES: Allergies  Allergen Reactions   Doxycycline  Nausea Only   Erythromycin Nausea And Vomiting    FAMILY HISTORY: Family History  Problem Relation Age of Onset   Irritable bowel syndrome Mother    COPD Mother    Depression Mother    Heart attack Father 50       x 2   Hypertension Father    Heart disease Father    Hypothyroidism Brother    Fleming Brother        sepsis from knee surgery   Breast cancer Maternal Aunt        dx mid 70s   Diabetes Maternal Aunt    Hypertension Maternal Aunt    Cancer Maternal Aunt    Depression Maternal Aunt    Hypertension Maternal Aunt    Breast cancer Maternal Grandmother        dx 30s, d. 30s   Cancer Maternal Grandmother    Heart attack Maternal Grandfather    Heart disease Maternal Grandfather    Breast cancer Paternal Grandmother        dx 93s; + vaginal cancer 60s, jaw cancer 79s    Cancer Paternal Grandmother    Heart attack Paternal Grandfather    OCD Daughter    Anxiety disorder Daughter    ADD / ADHD Son    Fleming Son        hypermobile EDS   Autism Son    Colon polyps Neg Hx    Colon cancer Neg Hx    Esophageal cancer Neg Hx    Stomach cancer Neg Hx    Rectal cancer Neg Hx     SOCIAL HISTORY: Social History   Tobacco Use   Smoking status: Former    Current packs/day: 0.00    Types: Cigarettes    Start date: 04/24/1990    Quit date: 04/24/2005    Years since quitting: 18.4    Passive exposure: Past   Smokeless tobacco: Never  Vaping Use   Vaping status: Never Used  Substance Use Topics   Alcohol use: Yes    Alcohol/week: 2.0 standard drinks of alcohol    Types: 2 Glasses of wine per week   Drug use: Never   Social History   Social History Narrative   Married   Two children    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 Right   Are you currently employed ? yes   What is your current occupation? home   Do you  live at home alone? Husband and kids and Dad   Who lives with you?    What type of home do you live in: 1 story or 2 story? two    Caffeine 2-3 day      Objective:  Vital Signs:  BP 104/76   Pulse 67   Ht 5\' 5"  (1.651 m)   Wt 190 lb (86.2 kg)   LMP 02/11/2023 (Exact Date)   SpO2 97%   BMI 31.62 kg/m   General: No acute distress.  Patient appears well-groomed.   Head:  Normocephalic/atraumatic Eyes:  Fundi examined, disc margins clear, no obvious papilledema Neck: supple, no paraspinal tenderness, full range of motion Heart:  Regular rate and rhythm Lungs:  Clear to auscultation bilaterally Back: No paraspinal tenderness Neurological Exam: alert and oriented.  Speech fluent and not dysarthric, language intact.  CN II-XII intact. Bulk and tone normal, muscle strength 5/5 throughout.  Sensation to light touch intact.  Deep tendon reflexes 2+ throughout.  Finger  to nose testing intact.  Gait normal.   Labs and Imaging review: No new results  Previously reviewed results: 03/07/23: CBC unremarkable   01/02/23: CMP unremarkable Lipid panel: tChol 189, LDL 110, TG 135   04/05/22: B6: 36.7 B1: 38 B12: 1005 CSF: 0 R, 1 W, 37 P, 66 G; opening pressure 17 cm of water             Lab Results  Component Value Date    TSH 2.03 08/05/2021       CBC and CMP wnl on 11/29/21 Vit D wnl on 05/24/20   CT maxillofacial w contrast (01/27/22): FINDINGS: Osseous: No acute fracture or aggressive osseous lesion identified.   Orbits: No orbital mass or acute orbital finding.   Sinuses: No significant paranasal sinus disease.   Soft tissues: Asymmetric soft tissue thickening and enhancement at the lateral aspect of the right external auditory canal.   Limited intracranial: No evidence of acute intracranial abnormality within the field of view. Partially empty sella turcica.   Fleming: The middle ear cavities appear normally aerated. No significant mastoid effusion. Streak and beam hardening artifact arising from dental restoration partially obscures the oral cavity.   IMPRESSION: Asymmetric soft tissue thickening and enhancement at the lateral aspect of the right external auditory canal, nonspecific but suspicious for acute otitis externa given the provided history. Direct visualization is recommended.   Partially empty sella turcica. While this finding often reflects incidental anatomic variation, it can also be associated with idiopathic intracranial hypertension (pseudotumor cerebri).   MRI brain wo contrast (05/07/22): FINDINGS: Brain:   No age advanced or lobar predominant parenchymal atrophy.   Partially empty sella turcica.   No cortical encephalomalacia is identified. No significant cerebral white matter disease.   There is no acute infarct.   No evidence of an intracranial mass.   No chronic intracranial blood products.   No  extra-axial fluid collection.   No midline shift.   Vascular: Maintained flow voids within the proximal large arterial vessels.   Skull and upper cervical spine: No focal suspicious marrow lesion.   Sinuses/Orbits: No mass or acute finding within the imaged orbits. No significant paranasal sinus disease.   IMPRESSION: 1. No evidence of an intracranial mass or acute infarct. 2. Partially empty sella turcica. This finding can reflect incidental anatomic variation, or alternatively, it can be associated with idiopathic intracranial hypertension (pseudotumor cerebri). 3. Otherwise unremarkable non-contrast MRI appearance of the brain.  Assessment/Plan:  This is Shirley Fleming,  a 48 y.o. female with episodic migraine with aura - initial concern for positional component of HA so IIH was concerned. LP showed normal opening pressure. She also has significant neck pain that contributes to headaches. Patient could not tolerate Topamax , so this was weaned off ~07/20/22. Patient currently on propranolol  10 mg BID having about 1 headache per month. Her headaches do not respond well to sumatriptan  currently, as they can last for 3 days or longer. I will switch this to rizatriptan today to see if this works better. Of note, she had a hysterectomy on 03/09/23.   Plan: Migraine prevention:  Continue propranolol  10 mg BID Migraine rescue:  Will switch sumatriptan  to rizatriptan 10 mg as needed at headache onset, can repeat after 2 hours if needed Limit use of pain relievers to no more than 2 days out of week to prevent risk of rebound or medication-overuse headache. Keep headache diary  -Continue home PT exercises for cervicalgia -Continue vitamin D 1000 international units daily   Return to clinic in 6 months  Total time spent reviewing records, interview, history/exam, documentation, and coordination of care on day of encounter:  30 min  Rommie Coats, MD

## 2023-09-19 ENCOUNTER — Ambulatory Visit (INDEPENDENT_AMBULATORY_CARE_PROVIDER_SITE_OTHER): Payer: BC Managed Care – PPO | Admitting: Neurology

## 2023-09-19 ENCOUNTER — Other Ambulatory Visit (HOSPITAL_BASED_OUTPATIENT_CLINIC_OR_DEPARTMENT_OTHER): Payer: Self-pay

## 2023-09-19 ENCOUNTER — Encounter: Payer: Self-pay | Admitting: Neurology

## 2023-09-19 VITALS — BP 104/76 | HR 67 | Ht 65.0 in | Wt 190.0 lb

## 2023-09-19 DIAGNOSIS — E559 Vitamin D deficiency, unspecified: Secondary | ICD-10-CM | POA: Diagnosis not present

## 2023-09-19 DIAGNOSIS — M542 Cervicalgia: Secondary | ICD-10-CM

## 2023-09-19 DIAGNOSIS — G43009 Migraine without aura, not intractable, without status migrainosus: Secondary | ICD-10-CM | POA: Diagnosis not present

## 2023-09-19 MED ORDER — RIZATRIPTAN BENZOATE 10 MG PO TABS
10.0000 mg | ORAL_TABLET | ORAL | 5 refills | Status: DC | PRN
  Filled 2023-09-19: qty 10, 30d supply, fill #0

## 2023-09-19 NOTE — Patient Instructions (Signed)
 Migraine prevention:  Continue propranolol  10 mg twice daily Migraine rescue:  Will switch sumatriptan  to rizatriptan 10 mg as needed at headache onset, can repeat after 2 hours if needed Limit use of pain relievers to no more than 2 days out of week to prevent risk of rebound or medication-overuse headache. Keep headache diary  -Continue home PT exercises for cervicalgia -Continue vitamin D 1000 international units daily   Return to clinic in 6 months  The physicians and staff at [facility] are committed to providing excellent care. You may receive a survey requesting feedback about your experience at our office. We strive to receive "very good" responses to the survey questions. If you feel that your experience would prevent you from giving the office a "very good " response, please contact our office to try to remedy the situation. We may be reached at [phone]. Thank you for taking the time out of your busy day to complete the survey.  Rommie Coats, MD [facility]

## 2023-09-25 DIAGNOSIS — F419 Anxiety disorder, unspecified: Secondary | ICD-10-CM | POA: Diagnosis not present

## 2023-09-25 DIAGNOSIS — F331 Major depressive disorder, recurrent, moderate: Secondary | ICD-10-CM | POA: Diagnosis not present

## 2023-10-16 ENCOUNTER — Other Ambulatory Visit: Payer: Self-pay | Admitting: Physician Assistant

## 2023-10-16 DIAGNOSIS — F331 Major depressive disorder, recurrent, moderate: Secondary | ICD-10-CM | POA: Diagnosis not present

## 2023-10-16 DIAGNOSIS — F419 Anxiety disorder, unspecified: Secondary | ICD-10-CM | POA: Diagnosis not present

## 2023-11-14 DIAGNOSIS — F419 Anxiety disorder, unspecified: Secondary | ICD-10-CM | POA: Diagnosis not present

## 2023-11-14 DIAGNOSIS — F331 Major depressive disorder, recurrent, moderate: Secondary | ICD-10-CM | POA: Diagnosis not present

## 2023-11-22 DIAGNOSIS — F419 Anxiety disorder, unspecified: Secondary | ICD-10-CM | POA: Diagnosis not present

## 2023-11-22 DIAGNOSIS — F331 Major depressive disorder, recurrent, moderate: Secondary | ICD-10-CM | POA: Diagnosis not present

## 2023-12-12 NOTE — Progress Notes (Unsigned)
   LILLETTE Ileana Collet, PhD, LAT, ATC acting as a scribe for Artist Lloyd, MD.  Shirley Fleming is a 48 y.o. female who presents to [facility] today for hip pain. Pt was last seen for her R hip in 2023.   Today, pt c/o R***L hip pain x ***. Pt locates pain to   Dx imaging: 08/09/21 R hip & L-spine XR  Pertinent review of systems: ***  Relevant historical information: ***   Exam:  LMP 02/11/2023 (Exact Date)  General: Well Developed, well nourished, and in no acute distress.   MSK: ***    Lab and Radiology Results No results found for this or any previous visit (from the past 72 hours). No results found.     Assessment and Plan: 48 y.o. female with ***   PDMP not reviewed this encounter. No orders of the defined types were placed in this encounter.  No orders of the defined types were placed in this encounter.    Discussed warning signs or symptoms. Please see discharge instructions. Patient expresses understanding.   ***

## 2023-12-13 DIAGNOSIS — F419 Anxiety disorder, unspecified: Secondary | ICD-10-CM | POA: Diagnosis not present

## 2023-12-13 DIAGNOSIS — F331 Major depressive disorder, recurrent, moderate: Secondary | ICD-10-CM | POA: Diagnosis not present

## 2023-12-14 ENCOUNTER — Encounter: Payer: Self-pay | Admitting: Family Medicine

## 2023-12-14 ENCOUNTER — Ambulatory Visit (INDEPENDENT_AMBULATORY_CARE_PROVIDER_SITE_OTHER): Admitting: Family Medicine

## 2023-12-14 ENCOUNTER — Ambulatory Visit (INDEPENDENT_AMBULATORY_CARE_PROVIDER_SITE_OTHER)

## 2023-12-14 ENCOUNTER — Other Ambulatory Visit: Payer: Self-pay

## 2023-12-14 VITALS — BP 118/80 | HR 71 | Ht 65.0 in | Wt 188.0 lb

## 2023-12-14 DIAGNOSIS — M25551 Pain in right hip: Secondary | ICD-10-CM

## 2023-12-14 DIAGNOSIS — Q796 Ehlers-Danlos syndrome, unspecified: Secondary | ICD-10-CM | POA: Diagnosis not present

## 2023-12-14 DIAGNOSIS — G8929 Other chronic pain: Secondary | ICD-10-CM

## 2023-12-14 DIAGNOSIS — M79672 Pain in left foot: Secondary | ICD-10-CM | POA: Diagnosis not present

## 2023-12-14 NOTE — Patient Instructions (Addendum)
 Thank you for coming in today.   Please get an Xray today before you leave   A referral for physical therapy has been submitted. A representative from the physical therapy office will contact you to coordinate scheduling after confirming your benefits with your insurance provider. If you do not hear from the physical therapy office within the next 1-2 weeks, please let us  know.    See you back as needed.

## 2023-12-18 ENCOUNTER — Ambulatory Visit: Admitting: Sports Medicine

## 2023-12-28 ENCOUNTER — Ambulatory Visit: Payer: Self-pay | Admitting: Family Medicine

## 2023-12-28 NOTE — Progress Notes (Signed)
 Right hip x-ray looks normal to radiology

## 2024-01-07 ENCOUNTER — Other Ambulatory Visit: Payer: Self-pay | Admitting: Physician Assistant

## 2024-01-10 ENCOUNTER — Ambulatory Visit: Admitting: Physician Assistant

## 2024-01-10 ENCOUNTER — Encounter: Payer: Self-pay | Admitting: Physician Assistant

## 2024-01-10 ENCOUNTER — Other Ambulatory Visit (HOSPITAL_BASED_OUTPATIENT_CLINIC_OR_DEPARTMENT_OTHER): Payer: Self-pay

## 2024-01-10 VITALS — BP 110/80 | HR 65 | Temp 97.8°F | Ht 65.5 in | Wt 186.0 lb

## 2024-01-10 DIAGNOSIS — F331 Major depressive disorder, recurrent, moderate: Secondary | ICD-10-CM | POA: Diagnosis not present

## 2024-01-10 DIAGNOSIS — Z1231 Encounter for screening mammogram for malignant neoplasm of breast: Secondary | ICD-10-CM

## 2024-01-10 DIAGNOSIS — E669 Obesity, unspecified: Secondary | ICD-10-CM | POA: Diagnosis not present

## 2024-01-10 DIAGNOSIS — Z23 Encounter for immunization: Secondary | ICD-10-CM | POA: Diagnosis not present

## 2024-01-10 DIAGNOSIS — E785 Hyperlipidemia, unspecified: Secondary | ICD-10-CM

## 2024-01-10 DIAGNOSIS — F419 Anxiety disorder, unspecified: Secondary | ICD-10-CM | POA: Diagnosis not present

## 2024-01-10 DIAGNOSIS — Z0000 Encounter for general adult medical examination without abnormal findings: Secondary | ICD-10-CM | POA: Diagnosis not present

## 2024-01-10 LAB — COMPREHENSIVE METABOLIC PANEL WITH GFR
ALT: 20 U/L (ref 0–35)
AST: 23 U/L (ref 0–37)
Albumin: 4.6 g/dL (ref 3.5–5.2)
Alkaline Phosphatase: 62 U/L (ref 39–117)
BUN: 13 mg/dL (ref 6–23)
CO2: 29 meq/L (ref 19–32)
Calcium: 9.7 mg/dL (ref 8.4–10.5)
Chloride: 102 meq/L (ref 96–112)
Creatinine, Ser: 0.75 mg/dL (ref 0.40–1.20)
GFR: 94.4 mL/min (ref >60.00)
Glucose, Bld: 85 mg/dL (ref 70–99)
Potassium: 3.8 meq/L (ref 3.5–5.1)
Sodium: 139 meq/L (ref 135–145)
Total Bilirubin: 0.5 mg/dL (ref 0.2–1.2)
Total Protein: 7.4 g/dL (ref 6.0–8.3)

## 2024-01-10 LAB — CBC WITH DIFFERENTIAL/PLATELET
Basophils Absolute: 0 K/uL (ref 0.0–0.1)
Basophils Relative: 0.7 % (ref 0.0–3.0)
Eosinophils Absolute: 0.4 K/uL (ref 0.0–0.7)
Eosinophils Relative: 5.9 % — ABNORMAL HIGH (ref 0.0–5.0)
HCT: 43.6 % (ref 36.0–46.0)
Hemoglobin: 14.6 g/dL (ref 12.0–15.0)
Lymphocytes Relative: 17.2 % (ref 12.0–46.0)
Lymphs Abs: 1.2 K/uL (ref 0.7–4.0)
MCHC: 33.5 g/dL (ref 30.0–36.0)
MCV: 88.3 fl (ref 78.0–100.0)
Monocytes Absolute: 0.6 K/uL (ref 0.1–1.0)
Monocytes Relative: 7.6 % (ref 3.0–12.0)
Neutro Abs: 5 K/uL (ref 1.4–7.7)
Neutrophils Relative %: 68.6 % (ref 43.0–77.0)
Platelets: 214 K/uL (ref 150.0–400.0)
RBC: 4.94 Mil/uL (ref 3.87–5.11)
RDW: 13.2 % (ref 11.5–15.5)
WBC: 7.3 K/uL (ref 4.0–10.5)

## 2024-01-10 LAB — LIPID PANEL
Cholesterol: 211 mg/dL — ABNORMAL HIGH (ref 0–200)
HDL: 52.6 mg/dL (ref >39.00)
LDL Cholesterol: 140 mg/dL — ABNORMAL HIGH (ref 0–99)
NonHDL: 158.04
Total CHOL/HDL Ratio: 4
Triglycerides: 90 mg/dL (ref 0.0–149.0)
VLDL: 18 mg/dL (ref 0.0–40.0)

## 2024-01-10 MED ORDER — FLUOXETINE HCL 40 MG PO CAPS
40.0000 mg | ORAL_CAPSULE | Freq: Every day | ORAL | 3 refills | Status: AC
  Filled 2024-01-10: qty 90, 90d supply, fill #0
  Filled 2024-04-10: qty 90, 90d supply, fill #1

## 2024-01-10 MED ORDER — IBUPROFEN 800 MG PO TABS
800.0000 mg | ORAL_TABLET | Freq: Three times a day (TID) | ORAL | 1 refills | Status: AC | PRN
  Filled 2024-01-10: qty 30, 10d supply, fill #0
  Filled 2024-04-10: qty 30, 10d supply, fill #1

## 2024-01-10 NOTE — Progress Notes (Signed)
 Subjective:    Shirley Fleming is a 48 y.o. female and is here for a comprehensive physical exam.  HPI  Health Maintenance Due  Topic Date Due   Mammogram  11/23/2023    Discussed the use of AI scribe software for clinical note transcription with the patient, who gave verbal consent to proceed.  History of Present Illness Shirley Fleming is a 48 year old female who presents for medication adjustment and follow-up on high-risk breast cancer screening.  She requests an increase in her Prozac  dosage from 30 mg to 40 mg.  She is currently taking Zepbound  vials 2.5 mg weekly and has lost approximately 20 pounds. She has not experienced any adverse reactions to Zepbound  and prefers to remain on the current dose due to cost concerns.  She underwent genetic testing for breast cancer, which was negative, but remains at high risk. A cancer specialist recommended alternating MRIs and mammograms every six months. She completed the initial MRI and mammograms but is overdue for the next MRI, which was due in February.   She walks 1.5 to 2.5 miles daily and is considering starting Pilates. She consumes two glasses of wine per week and reports no changes in her alcohol consumption. She struggles with sleep, often waking up in the middle of the night, and attributes it to various factors including her daughter's late-night homework.  She is up to date with her dental and eye exams but needs to contact her dermatologist for a skin check. She had a hysterectomy and continues to see her gynecologist due to family history concerns. She has not started bone density tests and reports no family history of osteoporosis or unexplained fractures.  Health Maintenance: Immunizations -- UpToDate - received flu shot today Colonoscopy -- UpToDate; due in 2034 Mammogram -- overdue  PAP -- n/a Bone Density -- n/a Diet -- healthy diet Exercise -- walking 1-2 miles daily  Sleep habits -- several wake ups at night Mood  -- room for improvement, will increase Prozac  (see above)  UTD with dentist? - yes UTD with eye doctor? - yes  Weight history: Wt Readings from Last 10 Encounters:  01/10/24 186 lb (84.4 kg)  12/14/23 188 lb (85.3 kg)  09/19/23 190 lb (86.2 kg)  08/22/23 194 lb (88 kg)  07/16/23 204 lb (92.5 kg)  05/08/23 207 lb 9.6 oz (94.2 kg)  03/27/23 203 lb 3.2 oz (92.2 kg)  03/26/23 202 lb (91.6 kg)  03/16/23 202 lb (91.6 kg)  03/09/23 200 lb 14.4 oz (91.1 kg)   Body mass index is 30.48 kg/m. Patient's last menstrual period was 02/11/2023 (exact date).  Alcohol use:  reports current alcohol use of about 2.0 standard drinks of alcohol per week.  Tobacco use:  Tobacco Use: Medium Risk (01/10/2024)   Patient History    Smoking Tobacco Use: Former    Smokeless Tobacco Use: Never    Passive Exposure: Past   Eligible for lung cancer screening? no     01/10/2024   11:33 AM  Depression screen PHQ 2/9  Decreased Interest 1  Down, Depressed, Hopeless 1  PHQ - 2 Score 2  Altered sleeping 0  Tired, decreased energy 1  Change in appetite 0  Feeling bad or failure about yourself  0  Trouble concentrating 2  Moving slowly or fidgety/restless 0  Suicidal thoughts 0  PHQ-9 Score 5  Difficult doing work/chores Somewhat difficult     Other providers/specialists: Patient Care Team: Job Lukes, [place] as PCP - General (  Physician Assistant) Leigh Venetia CROME, MD as Consulting Physician (Neurology) Glennon Almarie POUR, MD as Consulting Physician (Obstetrics and Gynecology)    PMHx, SurgHx, SocialHx, Medications, and Allergies were reviewed in the Visit Navigator and updated as appropriate.   Past Medical History:  Diagnosis Date   Allergy 1993   Seasonal; mold, cats, trees, dust   Anemia    takes iron- heavy menstral cycle   Anxiety    on meds   Asthma    uses inhaler with exercise; Follows w/ PCP.   Depression 1995   Off and on since teens   Empty sella (HCC)    Partially empty  sella per MR brain 05/07/22, Follows w/ neurololgy.   GERD (gastroesophageal reflux disease)    OTC PRN meds   History of COVID-19 12/23/2020   Hypertension Jan 2021   resolved per pt, no meds   Hypoglycemia    Inflamed seborrheic keratosis 2024   Follows w/ [facility].   Migraine with aura    Follows w/ Dr. Venetia Leigh, neurologist. See 05/07/22 MRI of brain in Epic.   Palpitations    after having Covid, takes Propranolol  prn, saw cardiologist, Dr. Lynwood Schilling, 06/17/20 Long term monitor in Epic   Pneumonia    mother was a smoker, pt states growing up she had pneumonia and bronchitis several times   Seasonal allergies    Wears glasses    for driving only     Past Surgical History:  Procedure Laterality Date   ABDOMINAL HYSTERECTOMY  Nov 2024   APPENDECTOMY  2003   COLONOSCOPY  02/09/2023   1 - 4mm polyp   CYSTOSCOPY N/A 03/09/2023   Procedure: CYSTOSCOPY;  Surgeon: Glennon Almarie POUR, MD;  Location: [facility];  Service: Gynecology;  Laterality: N/A;   ROBOTIC ASSISTED LAPAROSCOPIC HYSTERECTOMY AND SALPINGECTOMY Bilateral 03/09/2023   Procedure: XI ROBOTIC ASSISTED LAPAROSCOPIC HYSTERECTOMY AND SALPINGECTOMY;  Surgeon: Glennon Almarie POUR, MD;  Location: [facility];  Service: Gynecology;  Laterality: Bilateral;   TONSILLECTOMY AND ADENOIDECTOMY  1993     Family History  Problem Relation Age of Onset   Irritable bowel syndrome Mother    COPD Mother    Depression Mother    Heart attack Father 7       x 2   Hypertension Father    Heart disease Father    Hypothyroidism Brother    Other Brother        sepsis from knee surgery   Breast cancer Maternal Aunt        dx mid 72s   Diabetes Maternal Aunt    Hypertension Maternal Aunt    Cancer Maternal Aunt    Depression Maternal Aunt    Hypertension Maternal Aunt    Breast cancer Maternal Grandmother        dx 30s, d. 30s   Cancer Maternal Grandmother    Heart attack  Maternal Grandfather    Heart disease Maternal Grandfather    Breast cancer Paternal Grandmother        dx 67s; + vaginal cancer 60s, jaw cancer 87s   Cancer Paternal Grandmother    Heart attack Paternal Grandfather    OCD Daughter    Anxiety disorder Daughter    ADD / ADHD Son    Other Son        hypermobile EDS   Autism Son    Colon polyps Neg Hx    Colon cancer Neg Hx    Esophageal cancer  Neg Hx    Stomach cancer Neg Hx    Rectal cancer Neg Hx     Social History   Tobacco Use   Smoking status: Former    Current packs/day: 0.00    Types: Cigarettes    Start date: 04/24/1990    Quit date: 04/24/2005    Years since quitting: 18.7    Passive exposure: Past   Smokeless tobacco: Never  Vaping Use   Vaping status: Never Used  Substance Use Topics   Alcohol use: Yes    Alcohol/week: 2.0 standard drinks of alcohol    Types: 2 Glasses of wine per week   Drug use: Never    Review of Systems:   Review of Systems  Constitutional:  Negative for chills, fever, malaise/fatigue and weight loss.  HENT:  Negative for hearing loss, sinus pain and sore throat.   Respiratory:  Negative for cough and hemoptysis.   Cardiovascular:  Negative for chest pain, palpitations, leg swelling and PND.  Gastrointestinal:  Negative for abdominal pain, constipation, diarrhea, heartburn, nausea and vomiting.  Genitourinary:  Negative for dysuria, frequency and urgency.  Musculoskeletal:  Negative for back pain, myalgias and neck pain.  Skin:  Negative for itching and rash.  Neurological:  Negative for dizziness, tingling, seizures and headaches.  Endo/Heme/Allergies:  Negative for polydipsia.  Psychiatric/Behavioral:  Negative for depression. The patient is not nervous/anxious.     Objective:   BP 110/80 (BP Location: Left Arm, Patient Position: Sitting, Cuff Size: Normal)   Pulse 65   Temp 97.8 F (36.6 C) (Temporal)   Ht 5' 5.5 (1.664 m)   Wt 186 lb (84.4 kg)   LMP 02/11/2023 (Exact Date)    SpO2 99%   BMI 30.48 kg/m  Body mass index is 30.48 kg/m.   General Appearance:    Alert, cooperative, no distress, appears stated age  Head:    Normocephalic, without obvious abnormality, atraumatic  Eyes:    PERRL, conjunctiva/corneas clear, EOM's intact, fundi    benign, both eyes  Ears:    Normal TM's and external ear canals, both ears  Nose:   Nares normal, septum midline, mucosa normal, no drainage    or sinus tenderness  Throat:   Lips, mucosa, and tongue normal; teeth and gums normal  Neck:   Supple, symmetrical, trachea midline, no adenopathy;    thyroid :  no enlargement/tenderness/nodules; no carotid   bruit or JVD  Back:     Symmetric, no curvature, ROM normal, no CVA tenderness  Lungs:     Clear to auscultation bilaterally, respirations unlabored  Chest Wall:    No tenderness or deformity   Heart:    Regular rate and rhythm, S1 and S2 normal, no murmur, rub or gallop  Breast Exam:    Deferred  Abdomen:     Soft, non-tender, bowel sounds active all four quadrants,    no masses, no organomegaly  Genitalia:    Deferred   Extremities:   Extremities normal, atraumatic, no cyanosis or edema  Pulses:   2+ and symmetric all extremities  Skin:   Skin color, texture, turgor normal, no rashes or lesions  Lymph nodes:   Cervical, supraclavicular, and axillary nodes normal  Neurologic:   CNII-XII intact, normal strength, sensation and reflexes    throughout    Assessment/Plan:   Assessment and Plan Assessment & Plan Comprehensive Physical Exam (CPE) preventive care annual visit Today patient counseled on age appropriate routine health concerns for screening and prevention, each reviewed and  up to date or declined. Immunizations reviewed and up to date or declined. Labs ordered and reviewed. Risk factors for depression reviewed and negative. Hearing function and visual acuity are intact. ADLs screened and addressed as needed. Functional ability and level of safety reviewed and  appropriate. Education, counseling and referrals performed based on assessed risks today. Patient provided with a copy of personalized plan for preventive services.  Obesity She lost 20 pounds on Tirzepatide  without adverse reactions and prefers current dose due to cost. Awaiting oral product in December for potential switch. - Continue Tirzepatide  (Zepbound ) 2.5 mg/0.5 ml injection once a week. - Discuss potential switch to oral product expected in December if cost-effective.  Anxiety Room for improvement, per patient - Increase Fluoxetine  (Prozac ) to 40 mg oral daily.  High risk for breast cancer High risk for breast cancer, follows surveillance plan with alternating MRIs and mammograms. Overdue for breast MRI, initially scheduled for February, aims to complete by year-end due to deductible. - Order breast MRI at [facility].  HLD Update lipid panel and provide recommendations     Lucie Buttner, PA-C [facility]

## 2024-01-11 ENCOUNTER — Ambulatory Visit: Payer: Self-pay | Admitting: Physician Assistant

## 2024-01-15 ENCOUNTER — Other Ambulatory Visit: Payer: Self-pay | Admitting: Physician Assistant

## 2024-01-15 DIAGNOSIS — E785 Hyperlipidemia, unspecified: Secondary | ICD-10-CM

## 2024-01-18 ENCOUNTER — Ambulatory Visit (INDEPENDENT_AMBULATORY_CARE_PROVIDER_SITE_OTHER): Admitting: Physical Therapy

## 2024-01-18 DIAGNOSIS — R262 Difficulty in walking, not elsewhere classified: Secondary | ICD-10-CM | POA: Diagnosis not present

## 2024-01-18 DIAGNOSIS — G8929 Other chronic pain: Secondary | ICD-10-CM

## 2024-01-18 DIAGNOSIS — M25551 Pain in right hip: Secondary | ICD-10-CM | POA: Diagnosis not present

## 2024-01-18 NOTE — Therapy (Signed)
 OUTPATIENT PHYSICAL THERAPY LOWER EXTREMITY EVALUATION   Patient Name: Shirley Fleming MRN: [id number] DOB:[birth year], 48 y.o., female Today's Date: 01/18/2024  END OF SESSION:  PT End of Session - 01/18/24 1107     Visit Number 1    Number of Visits 12    Date for Recertification  02/29/24    Authorization Type BCBS    PT Start Time 1100    PT Stop Time 1200    PT Time Calculation (min) 60 min    Activity Tolerance Patient tolerated treatment well    Behavior During Therapy WFL for tasks assessed/performed          Past Medical History:  Diagnosis Date   Allergy 1993   Seasonal; mold, cats, trees, dust   Anemia    takes iron- heavy menstral cycle   Anxiety    on meds   Asthma    uses inhaler with exercise; Follows w/ PCP.   Depression 1995   Off and on since teens   Empty sella    Partially empty sella per MR brain 05/07/22, Follows w/ neurololgy.   GERD (gastroesophageal reflux disease)    OTC PRN meds   History of COVID-19 12/23/2020   Hypertension Jan 2021   resolved per pt, no meds   Hypoglycemia    Inflamed seborrheic keratosis 2024   Follows w/ [facility].   Migraine with aura    Follows w/ Dr. Venetia Potters, neurologist. See 05/07/22 MRI of brain in Epic.   Palpitations    after having Covid, takes Propranolol  prn, saw cardiologist, Dr. Lynwood Schilling, 06/17/20 Long term monitor in Epic   Pneumonia    mother was a smoker, pt states growing up she had pneumonia and bronchitis several times   Seasonal allergies    Wears glasses    for driving only   Past Surgical History:  Procedure Laterality Date   ABDOMINAL HYSTERECTOMY  Nov 2024   APPENDECTOMY  2003   COLONOSCOPY  02/09/2023   1 - 4mm polyp   CYSTOSCOPY N/A 03/09/2023   Procedure: CYSTOSCOPY;  Surgeon: Glennon Almarie POUR, MD;  Location: [facility];  Service: Gynecology;  Laterality: N/A;   ROBOTIC ASSISTED LAPAROSCOPIC HYSTERECTOMY AND SALPINGECTOMY Bilateral 03/09/2023    Procedure: XI ROBOTIC ASSISTED LAPAROSCOPIC HYSTERECTOMY AND SALPINGECTOMY;  Surgeon: Glennon Almarie POUR, MD;  Location: [facility];  Service: Gynecology;  Laterality: Bilateral;   TONSILLECTOMY AND ADENOIDECTOMY  1993   Patient Active Problem List   Diagnosis Date Noted   Multiple family members with Ehlers-Danlos syndrome 12/14/2023   At high risk for breast cancer 10/31/2021   Genetic testing 09/22/2021   Vitamin D deficiency 05/24/2020   Primary hypertension 05/24/2020   Migraine with aura and without status migrainosus, not intractable 05/24/2020   Anxiety 05/24/2020    PCP: Job Lukes PA   REFERRING PROVIDER: Joane Birmingham MD   REFERRING DIAG: [phone] (ICD-10-CM) - Chronic right hip pain  THERAPY DIAG:  Chronic right hip pain  Difficulty walking  Rationale for Evaluation and Treatment: Rehabilitation  ONSET DATE: acute on chronic?   SUBJECTIVE:   SUBJECTIVE STATEMENT: Patient is familiar to me as I have treated her children in the recent past.  [name] has had therapy about 2 years ago and had to stop due to family issues.  She reports pain off and on since then but she cites that since her hysterectomy in November the pain has gotten a bit worse.  She feels like since  she was not as active and then started to do more the pain has been increased in her right hip.  Recently on a trip to [place] she walked and did stairs excessively and she had a severe flareup. I constantly feel like its shifting out of place. I thought she was not able to walk, get home, etc.  The pain was excruciating at that time  She notices pain mostly in standing and walking she tries to keep her weight symmetrical but when she does not things get out of alignment including her knee.  The pain radiates down my back of thigh and the groin, stops at the knee.  The whole leg feels like it is not aligned properly.  She started wearing a compressive knee brace and that does  seem to help.   She knows that she is generally a hypermobile person or at least she was when she was in her teens and 59s she feel other pain in neck, upper back and knees.  She used to be an avid exerciser doing [employer] and running and is disappointed that she is not able to do anything currently without pain.   PERTINENT HISTORY: History of familial Ehlers-Danlos syndrome  Foot pain, hysterectomy ,migraines depression  PAIN:  Are you having pain? Yes: NPRS scale: 4, typical day is 2-4, then in eve 6/10  Pain location: Rt hip lateral but can radiate post and ant Pain description: sharp, unstable, shifting of the joint, can take a couple of days to be totally better   Aggravating factors: walking, standing  Relieving factors: laying down, resting, propping, ice/heat, min relief with meds, holding her hip   PRECAUTIONS: None  RED FLAGS: None   WEIGHT BEARING RESTRICTIONS: No  FALLS:  Has patient fallen in last 6 months? 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   NEXT MD VISIT: A few weeks  OBJECTIVE:  Note: Objective measures were completed at Evaluation unless otherwise noted.  DIAGNOSTIC FINDINGS:  Normal XR recently   PATIENT SURVEYS:  LEFS  Extreme difficulty/unable (0), Quite a bit of difficulty (1), Moderate difficulty (2), Little difficulty (3), No difficulty (4) Survey date:  01/18/2024  Any of your usual work, housework or school activities 3  2. Usual hobbies, recreational or sporting activities 2  3. Getting into/out of the bath 3  4.  Walking between rooms 2  5. Putting on socks/shoes 4  6. Squatting  2  7. Lifting an object, like a bag of groceries from the floor 3  8. Performing light activities around your home 3  9. Performing heavy activities around your home 2  10. Getting into/out of a car 1  11. Walking 2 blocks 1  12. Walking 1 mile 1  13. Going up/down 10 stairs (1 flight) 2  14. Standing for 1 hour 1  15.  sitting for 1 hour 2  16. Running on even ground 1  17. Running on uneven ground 1  18. Making sharp turns while running fast 1  19. Hopping  2  20. Rolling over in bed 2  Score total:  39/80     COGNITION: Overall cognitive status: Within functional limits for tasks assessed     SENSATION: Tingling Rt > LT. LE knee to feet   EDEMA:    MUSCLE LENGTH: WFL hamstrings and ant hip   POSTURE: knees hyperextended , mild swayback  PALPATION: Pain with palpation to the right anterolateral hip extending into soreness along the gluteus medius and minimus.  Min TTP  noted at the greater trochanter  LOWER EXTREMITY ROM: WNL   Passive ROM Right eval Left eval  Hip flexion    Hip extension    Hip abduction    Hip adduction    Hip internal rotation 30 Pain  20 no pain   Hip external rotation 50 45  Knee flexion    Knee extension    Ankle dorsiflexion    Ankle plantarflexion    Ankle inversion    Ankle eversion     (Blank rows = not tested)  LOWER EXTREMITY MMT:  MMT Right eval Left eval  Hip flexion 5 5  Hip extension 5 5  Hip abduction 4+/5 pain  5  Hip adduction 4+ pain  5  Hip internal rotation    Hip external rotation    Knee flexion 5 5  Knee extension 5 5  Ankle dorsiflexion 5 5  Ankle plantarflexion    Ankle inversion    Ankle eversion     (Blank rows = not tested)  LOWER EXTREMITY SPECIAL TESTS:  Hip special tests: Belvie (FABER) test: positive , Trendelenburg test: negative, Hip scouring test: positive , and Anterior hip impingement test: positive   FUNCTIONAL  TESTS:  5 times sit to stand: 12 SLS WNL bilateral  Squat Rt hip pinching good form  GAIT: No issues                                                                                                                                 TREATMENT DATE:  [facility] Adult PT Treatment:                                                DATE: 01/18/24 Self Care: Patient educated in hip impingement versus labral tear as a differential diagnosis Home exercise program Avoiding twisting upper body with a planted right lower extremity especially towards the right side Hypermobility and control range of motion   PATIENT EDUCATION:  Education details: See above education method: Explanation, Demonstration, and Handouts Education comprehension: verbalized understanding, returned demonstration, and needs further education  HOME EXERCISE PROGRAM: Access Code: [email or web address] URL: [email or web address] Date: 01/18/2024 Prepared by: Delon Norma  Exercises - Supine Bilateral Hip Internal Rotation Stretch  - 1 x daily - 7 x weekly - 2 sets - 10 reps - 5-10 hold -  CARS- Sidelying Hip Circles  - 1 x daily - 7 x weekly - 2 sets - 10 reps - Supine Bridge with Resistance Band  - 1 x daily - 7 x weekly - 2 sets - 10 reps - 5 hold - Standing Hip Flexor Stretch  - 1 x daily - 7 x weekly - 1 sets - 3 reps - 15-20 hold  ASSESSMENT:  CLINICAL IMPRESSION: Patient is a 48 y.o. female who was seen today for physical therapy evaluation and treatment for hip pain.  She presents with signs and symptoms consistent with FAI/impingement  OBJECTIVE IMPAIRMENTS: decreased mobility, difficulty walking, decreased ROM, decreased strength, increased fascial restrictions, impaired flexibility, pain, and ?  Hypermobility.   ACTIVITY LIMITATIONS: carrying, lifting, bending, sitting, standing, squatting, sleeping, transfers, locomotion level, and caring for others  PARTICIPATION LIMITATIONS: cleaning, interpersonal relationship,  driving, shopping, community activity, and occupation  PERSONAL FACTORS: Past/current experiences, Time since onset of injury/illness/exacerbation, and 1-2 comorbidities: history of hypermobility, polyarthralgia are also affecting patient's functional outcome.   REHAB POTENTIAL: Excellent  CLINICAL DECISION MAKING: Evolving/moderate complexity  EVALUATION COMPLEXITY: Moderate   GOALS: Goals reviewed with patient? Yes  SHORT TERM GOALS: Target date: 02/15/2024   Patient will be able to show independence for initial HEP to include posture, core and hip strength and stability.   Baseline: Goal status: INITIAL  2.  Patient will be I with concepts of joint protection and stability as it pertains to joint hypermobility. Baseline:  Goal status: INITIAL  3.  Patient will report no hip pain with basic squat, sit to stand transfers Baseline:  Goal status: INITIAL  4.  Patient will be able to walk 20 minutes with minimal increase in pain in the right hip Baseline:  Goal status: INITIAL  LONG TERM GOALS: Target date: 03/14/2024    Patient will improve LEFS score by 9 points or more Baseline: 39/80 Goal status: INITIAL  2.  Patient be able to walk 30 minutes for exercise without increased right hip pain 75% of the time Baseline:  Goal status: INITIAL  3.  Patient will be independent with home exercise program upon discharge Baseline:  Goal status: INITIAL  4.  Patient will be able to carry light items up and down the stairs without increased hip pain  Baseline:  Goal status: INITIAL  5.  Further goals to be assessed if necessary Baseline:  Goal status: INITIAL   PLAN:  PT FREQUENCY: 1-2x/week  PT DURATION: 6 weeks  PLANNED INTERVENTIONS: 97164- PT Re-evaluation, 97750- Physical Performance Testing, 97110-Therapeutic exercises, 97530- Therapeutic activity, 97112- Neuromuscular re-education, 97535- Self Care, [id number]- Manual therapy, Patient/Family education, Balance  training, Taping, Joint mobilization, Cryotherapy, and Moist heat  PLAN FOR NEXT SESSION: check HEP progress core stability, improve hip mobility consider using the Pilates reformer or tower for max benefit, functional goal   Eshawn Coor, PT 01/18/2024, 12:28 PM   Delon Norma, PT 01/18/24 12:28 PM Phone: [phone] Fax: [phone]

## 2024-01-23 DIAGNOSIS — F331 Major depressive disorder, recurrent, moderate: Secondary | ICD-10-CM | POA: Diagnosis not present

## 2024-01-23 DIAGNOSIS — F419 Anxiety disorder, unspecified: Secondary | ICD-10-CM | POA: Diagnosis not present

## 2024-01-24 ENCOUNTER — Encounter: Payer: Self-pay | Admitting: Physical Therapy

## 2024-01-24 ENCOUNTER — Ambulatory Visit (INDEPENDENT_AMBULATORY_CARE_PROVIDER_SITE_OTHER): Admitting: Physical Therapy

## 2024-01-24 DIAGNOSIS — G8929 Other chronic pain: Secondary | ICD-10-CM

## 2024-01-24 DIAGNOSIS — R262 Difficulty in walking, not elsewhere classified: Secondary | ICD-10-CM

## 2024-01-24 DIAGNOSIS — M25551 Pain in right hip: Secondary | ICD-10-CM | POA: Diagnosis not present

## 2024-01-24 NOTE — Therapy (Signed)
 OUTPATIENT PHYSICAL THERAPY LOWER EXTREMITY NOTE   Patient Name: Shirley Fleming MRN: [id number] DOB:[birth year], 48 y.o., female Today's Date: 01/24/2024  END OF SESSION:  PT End of Session - 01/24/24 0850     Visit Number 2    Number of Visits 12    Date for Recertification  02/29/24    Authorization Type BCBS    PT Start Time [phone]    PT Stop Time 0930    PT Time Calculation (min) 43 min    Activity Tolerance Patient tolerated treatment well          Past Medical History:  Diagnosis Date   Allergy 1993   Seasonal; mold, cats, trees, dust   Anemia    takes iron- heavy menstral cycle   Anxiety    on meds   Asthma    uses inhaler with exercise; Follows w/ PCP.   Depression 1995   Off and on since teens   Empty sella    Partially empty sella per MR brain 05/07/22, Follows w/ neurololgy.   GERD (gastroesophageal reflux disease)    OTC PRN meds   History of COVID-19 12/23/2020   Hypertension Jan 2021   resolved per pt, no meds   Hypoglycemia    Inflamed seborrheic keratosis 2024   Follows w/ [facility].   Migraine with aura    Follows w/ Dr. Venetia Potters, neurologist. See 05/07/22 MRI of brain in Epic.   Palpitations    after having Covid, takes Propranolol  prn, saw cardiologist, Dr. Lynwood Schilling, 06/17/20 Long term monitor in Epic   Pneumonia    mother was a smoker, pt states growing up she had pneumonia and bronchitis several times   Seasonal allergies    Wears glasses    for driving only   Past Surgical History:  Procedure Laterality Date   ABDOMINAL HYSTERECTOMY  Nov 2024   APPENDECTOMY  2003   COLONOSCOPY  02/09/2023   1 - 4mm polyp   CYSTOSCOPY N/A 03/09/2023   Procedure: CYSTOSCOPY;  Surgeon: Glennon Almarie POUR, MD;  Location: [facility];  Service: Gynecology;  Laterality: N/A;   ROBOTIC ASSISTED LAPAROSCOPIC HYSTERECTOMY AND SALPINGECTOMY Bilateral 03/09/2023   Procedure: XI ROBOTIC ASSISTED LAPAROSCOPIC HYSTERECTOMY AND  SALPINGECTOMY;  Surgeon: Glennon Almarie POUR, MD;  Location: [facility];  Service: Gynecology;  Laterality: Bilateral;   TONSILLECTOMY AND ADENOIDECTOMY  1993   Patient Active Problem List   Diagnosis Date Noted   Multiple family members with Ehlers-Danlos syndrome 12/14/2023   At high risk for breast cancer 10/31/2021   Genetic testing 09/22/2021   Vitamin D deficiency 05/24/2020   Primary hypertension 05/24/2020   Migraine with aura and without status migrainosus, not intractable 05/24/2020   Anxiety 05/24/2020    PCP: Job Lukes PA   REFERRING PROVIDER: Joane Birmingham MD   REFERRING DIAG: [phone] (ICD-10-CM) - Chronic right hip pain  THERAPY DIAG:  Chronic right hip pain  Difficulty walking  Rationale for Evaluation and Treatment: Rehabilitation  ONSET DATE: acute on chronic?   SUBJECTIVE:   SUBJECTIVE STATEMENT: Patient is familiar to me as I have treated her children in the recent past.  Deoni has had therapy about 2 years ago and had to stop due to family issues.  She reports pain off and on since then but she cites that since her hysterectomy in November the pain has gotten a bit worse.  She feels like since she was not as active and then started to do  more the pain has been increased in her right hip.  Recently on a trip to [place] she walked and did stairs excessively and she had a severe flareup. I constantly feel like its shifting out of place. I thought she was not able to walk, get home, etc.  The pain was excruciating at that time  She notices pain mostly in standing and walking she tries to keep her weight symmetrical but when she does not things get out of alignment including her knee.  The pain radiates down my back of thigh and the groin, stops at the knee.  The whole leg feels like it is not aligned properly.  She started wearing a compressive knee brace and that does seem to help.   She knows that she is generally a hypermobile  person or at least she was when she was in her teens and 55s she feel other pain in neck, upper back and knees.  She used to be an avid exerciser doing [employer] and running and is disappointed that she is not able to do anything currently without pain.   PERTINENT HISTORY: History of familial Ehlers-Danlos syndrome  Foot pain, hysterectomy ,migraines depression  PAIN:  Are you having pain? Yes: NPRS scale: 4, typical day is 2-4, then in eve 6/10  Pain location: Rt hip lateral but can radiate post and ant Pain description: sharp, unstable, shifting of the joint, can take a couple of days to be totally better   Aggravating factors: walking, standing  Relieving factors: laying down, resting, propping, ice/heat, min relief with meds, holding her hip   PRECAUTIONS: None  RED FLAGS: None   WEIGHT BEARING RESTRICTIONS: No  FALLS:  Has patient fallen in last 6 months? 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   NEXT MD VISIT: A few weeks  OBJECTIVE:  Note: Objective measures were completed at Evaluation unless otherwise noted.  DIAGNOSTIC FINDINGS:  Normal XR recently   PATIENT SURVEYS:  LEFS  Extreme difficulty/unable (0), Quite a bit of difficulty (1), Moderate difficulty (2), Little difficulty (3), No difficulty (4) Survey date:  01/18/2024  Any of your usual work, housework or school activities 3  2. Usual hobbies, recreational or sporting activities 2  3. Getting into/out of the bath 3  4. Walking between rooms 2  5. Putting on socks/shoes 4  6.  Squatting  2  7. Lifting an object, like a bag of groceries from the floor 3  8. Performing light activities around your home 3  9. Performing heavy activities around your home 2  10. Getting into/out of a car 1  11. Walking 2 blocks 1  12. Walking 1 mile 1  13. Going up/down 10 stairs (1 flight) 2  14. Standing for 1 hour 1  15.  sitting for 1 hour 2  16. Running on even ground 1  17. Running on uneven ground 1  18. Making sharp turns while running fast 1  19. Hopping  2  20. Rolling over in bed 2  Score total:  39/80     COGNITION: Overall cognitive status: Within functional limits for tasks assessed     SENSATION: Tingling Rt > LT. LE knee to feet   EDEMA:    MUSCLE LENGTH: WFL hamstrings and ant hip   POSTURE: knees hyperextended , mild swayback  PALPATION: Pain with palpation to the right anterolateral hip extending into soreness along the gluteus medius and minimus.  Min TTP  noted at the greater trochanter  LOWER EXTREMITY ROM: WNL   Passive ROM Right eval Left eval  Hip flexion    Hip extension    Hip abduction    Hip adduction    Hip internal rotation 30 Pain  20 no pain   Hip external rotation 50 45  Knee flexion    Knee extension    Ankle dorsiflexion    Ankle plantarflexion    Ankle inversion    Ankle eversion     (Blank rows = not tested)  LOWER EXTREMITY MMT:  MMT Right eval Left eval  Hip flexion 5 5  Hip extension 5 5  Hip abduction 4+/5 pain  5  Hip adduction 4+ pain  5  Hip internal rotation    Hip external rotation    Knee flexion 5 5  Knee extension 5 5  Ankle dorsiflexion 5 5  Ankle plantarflexion    Ankle inversion    Ankle eversion     (Blank rows = not tested)  LOWER EXTREMITY SPECIAL TESTS:  Hip special tests: Belvie (FABER) test: positive , Trendelenburg test: negative, Hip scouring test: positive , and Anterior hip impingement test: positive   FUNCTIONAL TESTS:  5 times sit to stand: 12 SLS WNL bilateral  Squat  Rt hip pinching good form  GAIT: No issues                                                                                                                                 TREATMENT DATE:   [facility] Adult PT Treatment:                                                DATE: 01/24/24 Therapeutic Exercise:  recumbent bike for warm up Therapeutic Activity: Pilates Reformer used for LE/core strength, postural strength, lumbopelvic disassociation and core control.  Exercises included: Footwork  Double leg Parallel heels, toes narrow and wide Pilates V heels and toes narrow and wide  2 red 1 blue 1 Yellow  Single leg  Heel in parallel  Double leg heel raise and calf stretch, prancing  Bridging with ball x 10 x 2 sets, 2nd set with band , clam with green band, cues for breathing   Feet in Straps 1 Red 1 Yellow Arcs, Circles, Squat x 10     [facility] Adult PT Treatment:  DATE: 01/18/24 Self Care: Patient educated in hip impingement versus labral tear as a differential diagnosis Home exercise program Avoiding twisting upper body with a planted right lower extremity especially towards the right side Hypermobility and control range of motion   PATIENT EDUCATION:  Education details: See above education method: Explanation, Demonstration, and Handouts Education comprehension: verbalized understanding, returned demonstration, and needs further education  HOME EXERCISE PROGRAM: Access Code: [email or web address] URL: [email or web address] Date: 01/18/2024 Prepared by: Delon Norma  Exercises - Supine Bilateral Hip Internal Rotation Stretch  - 1 x daily - 7 x weekly - 2 sets - 10 reps - 5-10 hold - CARS- Sidelying Hip Circles  - 1 x daily - 7 x weekly - 2 sets - 10 reps - Supine Bridge with Resistance Band  - 1 x daily - 7 x weekly - 2 sets - 10 reps - 5 hold - Standing Hip Flexor Stretch  - 1 x daily - 7 x weekly - 1 sets - 3 reps - 15-20  hold  ASSESSMENT:  CLINICAL IMPRESSION: Patient tolerated Pilates exercises well for basic lower body mobility, ROM and stability for lumbopelvic area.  Decreased Rt hip ROM to modify for Rt hip pain .  Pt felt good after session, including back stiffness.   Patient is a 48 y.o. female who was seen today for physical therapy evaluation and treatment for hip pain.  She presents with signs and symptoms consistent with FAI/impingement  OBJECTIVE IMPAIRMENTS: decreased mobility, difficulty walking, decreased ROM, decreased strength, increased fascial restrictions, impaired flexibility, pain, and ?  Hypermobility.   ACTIVITY LIMITATIONS: carrying, lifting, bending, sitting, standing, squatting, sleeping, transfers, locomotion level, and caring for others  PARTICIPATION LIMITATIONS: cleaning, interpersonal relationship, driving, shopping, community activity, and occupation  PERSONAL FACTORS: Past/current experiences, Time since onset of injury/illness/exacerbation, and 1-2 comorbidities: history of hypermobility, polyarthralgia are also affecting patient's functional outcome.   REHAB POTENTIAL: Excellent  CLINICAL DECISION MAKING: Evolving/moderate complexity  EVALUATION COMPLEXITY: Moderate   GOALS: Goals reviewed with patient? Yes  SHORT TERM GOALS: Target date: 02/15/2024   Patient will be able to show independence for initial HEP to include posture, core and hip strength and stability.   Baseline: Goal status: INITIAL  2.  Patient will be I with concepts of joint protection and stability as it pertains to joint hypermobility. Baseline:  Goal status: INITIAL  3.  Patient will report no hip pain with basic squat, sit to stand transfers Baseline:  Goal status: INITIAL  4.  Patient will be able to walk 20 minutes with minimal increase in pain in the right hip Baseline:  Goal status: INITIAL  LONG TERM GOALS: Target date: 03/14/2024    Patient will improve LEFS score by 9  points or more Baseline: 39/80 Goal status: INITIAL  2.  Patient be able to walk 30 minutes for exercise without increased right hip pain 75% of the time Baseline:  Goal status: INITIAL  3.  Patient will be independent with home exercise program upon discharge Baseline:  Goal status: INITIAL  4.  Patient will be able to carry light items up and down the stairs without increased hip pain  Baseline:  Goal status: INITIAL  5.  Further goals to be assessed if necessary Baseline:  Goal status: INITIAL   PLAN:  PT FREQUENCY: 1-2x/week  PT DURATION: 6 weeks  PLANNED INTERVENTIONS: 97164- PT Re-evaluation, 97750- Physical Performance Testing, 97110-Therapeutic exercises, 97530- Therapeutic activity, [id number]- Neuromuscular re-education, 97535- Self Care, [id number]- Manual therapy, Patient/Family education, Balance  training, Taping, Joint mobilization, Cryotherapy, and Moist heat  PLAN FOR NEXT SESSION: check HEP progress core stability, improve hip mobility consider using the Pilates reformer or tower for max benefit, functional goal   Zunairah Devers, PT 01/24/2024, 8:51 AM   Delon Norma, PT 01/24/24 8:51 AM Phone: [phone] Fax: [phone]

## 2024-01-29 ENCOUNTER — Encounter: Payer: Self-pay | Admitting: Physical Therapy

## 2024-01-29 ENCOUNTER — Ambulatory Visit: Admitting: Physical Therapy

## 2024-01-29 DIAGNOSIS — G8929 Other chronic pain: Secondary | ICD-10-CM | POA: Diagnosis not present

## 2024-01-29 DIAGNOSIS — M25551 Pain in right hip: Secondary | ICD-10-CM

## 2024-01-29 DIAGNOSIS — R262 Difficulty in walking, not elsewhere classified: Secondary | ICD-10-CM | POA: Diagnosis not present

## 2024-01-29 NOTE — Therapy (Signed)
 OUTPATIENT PHYSICAL THERAPY LOWER EXTREMITY NOTE   Patient Name: Shirley Fleming MRN: [id number] DOB:[birth year], 48 y.o., female Today's Date: 01/29/2024  END OF SESSION:  PT End of Session - 01/29/24 0942     Visit Number 3    Number of Visits 12    Date for Recertification  02/29/24    Authorization Type BC[employer]    PT Start Time 0935    PT Stop Time 1015    PT Time Calculation (min) 40 min    Activity Tolerance Patient tolerated treatment well    Behavior During Therapy WFL for tasks assessed/performed           Past Medical History:  Diagnosis Date   Allergy 1993   Seasonal; mold, cats, trees, dust   Anemia    takes iron- heavy menstral cycle   Anxiety    on meds   Asthma    uses inhaler with exercise; Follows w/ PCP.   Depression 1995   Off and on since teens   Empty sella    Partially empty sella per MR brain 05/07/22, Follows w/ neurololgy.   GERD (gastroesophageal reflux disease)    OTC PRN meds   History of COVID-19 12/23/2020   Hypertension Jan 2021   resolved per pt, no meds   Hypoglycemia    Inflamed seborrheic keratosis 2024   Follows w/ [facility].   Migraine with aura    Follows w/ Dr. Venetia Potters, neurologist. See 05/07/22 MRI of brain in Epic.   Palpitations    after having Covid, takes Propranolol  prn, saw cardiologist, Dr. Lynwood Schilling, 06/17/20 Long term monitor in Epic   Pneumonia    mother was a smoker, pt states growing up she had pneumonia and bronchitis several times   Seasonal allergies    Wears glasses    for driving only   Past Surgical History:  Procedure Laterality Date   ABDOMINAL HYSTERECTOMY  Nov 2024   APPENDECTOMY  2003   COLONOSCOPY  02/09/2023   1 - 4mm polyp   CYSTOSCOPY N/A 03/09/2023   Procedure: CYSTOSCOPY;  Surgeon: Glennon Almarie POUR, MD;  Location: [facility];  Service: Gynecology;  Laterality: N/A;   ROBOTIC ASSISTED LAPAROSCOPIC HYSTERECTOMY AND SALPINGECTOMY Bilateral 03/09/2023    Procedure: XI ROBOTIC ASSISTED LAPAROSCOPIC HYSTERECTOMY AND SALPINGECTOMY;  Surgeon: Glennon Almarie POUR, MD;  Location: [facility];  Service: Gynecology;  Laterality: Bilateral;   TONSILLECTOMY AND ADENOIDECTOMY  1993   Patient Active Problem List   Diagnosis Date Noted   Multiple family members with Ehlers-Danlos syndrome 12/14/2023   At high risk for breast cancer 10/31/2021   Genetic testing 09/22/2021   Vitamin D deficiency 05/24/2020   Primary hypertension 05/24/2020   Migraine with aura and without status migrainosus, not intractable 05/24/2020   Anxiety 05/24/2020    PCP: Job Lukes PA   REFERRING PROVIDER: Joane Birmingham MD   REFERRING DIAG: [phone] (ICD-10-CM) - Chronic right hip pain  THERAPY DIAG:  Chronic right hip pain  Difficulty walking  Rationale for Evaluation and Treatment: Rehabilitation  ONSET DATE: acute on chronic?   SUBJECTIVE:   SUBJECTIVE STATEMENT: Neck and Foot pain R today.  My hip is OK.  I found out I have abs. Has been doing the recumbent bike at home.     Patient is familiar to me as I have treated her children in the recent past.  Kambria has had therapy about 2 years ago and had to stop due to family  issues.  She reports pain off and on since then but she cites that since her hysterectomy in November the pain has gotten a bit worse.  She feels like since she was not as active and then started to do more the pain has been increased in her right hip.  Recently on a trip to [place] she walked and did stairs excessively and she had a severe flareup. I constantly feel like its shifting out of place. I thought she was not able to walk, get home, etc.  The pain was excruciating at that time  She notices pain mostly in standing and walking she tries to keep her weight symmetrical but when she does not things get out of alignment including her knee.  The pain radiates down my back of thigh and the groin, stops at the knee.   The whole leg feels like it is not aligned properly.  She started wearing a compressive knee brace and that does seem to help.   She knows that she is generally a hypermobile person or at least she was when she was in her teens and 33s she feel other pain in neck, upper back and knees.  She used to be an avid exerciser doing [employer] and running and is disappointed that she is not able to do anything currently without pain.   PERTINENT HISTORY: History of familial Ehlers-Danlos syndrome  Foot pain, hysterectomy ,migraines depression  PAIN:  Are you having pain? Yes: NPRS scale: 2.10, typical day is 2-4, then in eve 6/10  Pain location: Rt hip lateral but can radiate post and ant Pain description: sharp, unstable, shifting of the joint, can take a couple of days to be totally better   Aggravating factors: walking, standing  Relieving factors: laying down, resting, propping, ice/heat, min relief with meds, holding her hip   PRECAUTIONS: None  RED FLAGS: None   WEIGHT BEARING RESTRICTIONS: No  FALLS:  Has patient fallen in last 6 months? 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   NEXT MD VISIT: A few weeks  OBJECTIVE:  Note: Objective measures were completed at Evaluation unless otherwise noted.  DIAGNOSTIC FINDINGS:  Normal XR recently   PATIENT SURVEYS:  LEFS  Extreme difficulty/unable (0), Quite a bit of difficulty (1), Moderate difficulty (2), Little difficulty (3), No difficulty (4) Survey date:  01/18/2024  Any of your usual work, housework or  school activities 3  2. Usual hobbies, recreational or sporting activities 2  3. Getting into/out of the bath 3  4. Walking between rooms 2  5. Putting on socks/shoes 4  6. Squatting  2  7. Lifting an object, like a bag of groceries from the floor 3  8. Performing light activities around your home 3  9. Performing heavy activities around your home 2  10. Getting into/out of a car 1  11. Walking 2 blocks 1  12. Walking 1 mile 1  13. Going up/down 10 stairs (1 flight) 2  14. Standing for 1 hour 1  15.  sitting for 1  hour 2  16. Running on even ground 1  17. Running on uneven ground 1  18. Making sharp turns while running fast 1  19. Hopping  2  20. Rolling over in bed 2  Score total:  39/80     COGNITION: Overall cognitive status: Within functional limits for tasks assessed     SENSATION: Tingling Rt > LT. LE knee to feet   EDEMA:    MUSCLE LENGTH: WFL hamstrings and ant hip   POSTURE: knees hyperextended , mild swayback  PALPATION: Pain with palpation to the right anterolateral hip extending into soreness along the gluteus medius and minimus.  Min TTP  noted at the greater trochanter  LOWER EXTREMITY ROM: WNL   Passive ROM Right eval Left eval  Hip flexion    Hip extension    Hip abduction    Hip adduction    Hip internal rotation 30 Pain  20 no pain   Hip external rotation 50 45  Knee flexion    Knee extension    Ankle dorsiflexion    Ankle plantarflexion    Ankle inversion    Ankle eversion     (Blank rows = not tested)  LOWER EXTREMITY MMT:  MMT Right eval Left eval  Hip flexion 5 5  Hip extension 5 5  Hip abduction 4+/5 pain  5  Hip adduction 4+ pain  5  Hip internal rotation    Hip external rotation    Knee flexion 5 5  Knee extension 5 5  Ankle dorsiflexion 5 5  Ankle plantarflexion    Ankle inversion    Ankle eversion     (Blank rows = not tested)  LOWER EXTREMITY SPECIAL TESTS:  Hip special tests: Belvie (FABER) test: positive ,  Trendelenburg test: negative, Hip scouring test: positive , and Anterior hip impingement test: positive   FUNCTIONAL TESTS:  5 times sit to stand: 12 SLS WNL bilateral  Squat Rt hip pinching good form  GAIT: No issues                                                                                                                                 TREATMENT DATE:    [facility] Adult PT Treatment:                                                DATE: 01/29/24 Therapeutic Exercise: Recumbent bike 6 min L 1  Supine posterior pelvic tilt over ball Bent knee fall out with ball  x 10  March x 10 each  SLR x 10  Added knee ext  Added Arm Arc (opp)  Therapeutic Activity: Sidelying footwork (hip) 2 red hip abd, ER  Hip adduction  Footwork  Double leg Parallel heels, toes narrow and wide 2 red 1 blue 1  Yellow  Double leg heel raise and calf stretch, prancing    [facility] Adult PT Treatment:                                                DATE: 01/24/24 Therapeutic Exercise:  recumbent bike for warm up Therapeutic Activity: Pilates Reformer used for LE/core strength, postural strength, lumbopelvic disassociation and core control.  Exercises included: Footwork  Double leg Parallel heels, toes narrow and wide Pilates V heels and toes narrow and wide  2 red 1 blue 1 Yellow  Single leg  Heel in parallel  Double leg heel raise and calf stretch, prancing  Bridging with ball x 10 x 2 sets, 2nd set with band , clam with green band, cues for breathing   Feet in Straps 1 Red 1 Yellow Arcs, Circles, Squat x 10     [facility] Adult PT Treatment:                                                DATE: 01/18/24 Self Care: Patient educated in hip impingement versus labral tear as a differential diagnosis Home exercise program Avoiding twisting upper body with a planted right lower extremity especially towards the right side Hypermobility and control range of motion   PATIENT EDUCATION:  Education details:  See above education method: Explanation, Demonstration, and Handouts Education comprehension: verbalized understanding, returned demonstration, and needs further education  HOME EXERCISE PROGRAM: Access Code: [email or web address] URL: [email or web address] Date: 01/18/2024 Prepared by: Delon Norma  Exercises - Supine Bilateral Hip Internal Rotation Stretch  - 1 x daily - 7 x weekly - 2 sets - 10 reps - 5-10 hold - CARS- Sidelying Hip Circles  - 1 x daily - 7 x weekly - 2 sets - 10 reps - Supine Bridge with Resistance Band  - 1 x daily - 7 x weekly - 2 sets - 10 reps - 5 hold - Standing Hip Flexor Stretch  - 1 x daily - 7 x weekly - 1 sets - 3 reps - 15-20 hold  ASSESSMENT:  CLINICAL IMPRESSION: Patient reports pain in hips overall improving does have some chronic neck issues today but it did not interfere with the session.  Focus on flatback neutral spine core and controlled stabilization exercises and integrated light Pilates foot work.    Patient is a 48 y.o. female who was seen today for physical therapy evaluation and treatment for hip pain.  She presents with signs and symptoms consistent with FAI/impingement  OBJECTIVE IMPAIRMENTS: decreased mobility, difficulty walking, decreased ROM, decreased strength, increased fascial restrictions, impaired flexibility, pain, and ?  Hypermobility.   ACTIVITY LIMITATIONS: carrying, lifting, bending, sitting, standing, squatting, sleeping, transfers, locomotion level, and caring for others  PARTICIPATION LIMITATIONS: cleaning, interpersonal relationship, driving, shopping, community activity, and occupation  PERSONAL FACTORS: Past/current experiences, Time since onset of injury/illness/exacerbation, and 1-2 comorbidities: history of hypermobility, polyarthralgia are also affecting patient's functional outcome.   REHAB POTENTIAL: Excellent  CLINICAL DECISION MAKING: Evolving/moderate complexity  EVALUATION COMPLEXITY:  Moderate   GOALS: Goals reviewed with patient? Yes  SHORT TERM GOALS: Target date: 02/15/2024   Patient will be able to show independence for initial HEP to include posture, core and hip strength and stability.  Baseline: Goal status: ongoing   2.  Patient will be I with concepts of joint protection and stability as it pertains to joint hypermobility. Baseline:  Goal status:ongoing   3.  Patient will report no hip pain with basic squat, sit to stand transfers Baseline:  Goal status:ongoing   4.  Patient will be able to walk 20 minutes with minimal increase in pain in the right hip Baseline:  Goal status: ongoing  LONG TERM GOALS: Target date: 03/14/2024    Patient will improve LEFS score by 9 points or more Baseline: 39/80 Goal status: INITIAL  2.  Patient be able to walk 30 minutes for exercise without increased right hip pain 75% of the time Baseline:  Goal status: INITIAL  3.  Patient will be independent with home exercise program upon discharge Baseline:  Goal status: INITIAL  4.  Patient will be able to carry light items up and down the stairs without increased hip pain  Baseline:  Goal status: INITIAL  5.  Further goals to be assessed if necessary Baseline:  Goal status: INITIAL   PLAN:  PT FREQUENCY: 1-2x/week  PT DURATION: 6 weeks  PLANNED INTERVENTIONS: 97164- PT Re-evaluation, 97750- Physical Performance Testing, 97110-Therapeutic exercises, 97530- Therapeutic activity, 97112- Neuromuscular re-education, 97535- Self Care, [id number]- Manual therapy, Patient/Family education, Balance training, Taping, Joint mobilization, Cryotherapy, and Moist heat  PLAN FOR NEXT SESSION: progress HEP progress core stability, improve hip mobility consider using the Pilates reformer or tower for max benefit, functional goal   Roshon Duell, PT 01/29/2024, 10:27 AM   Delon Norma, PT 01/29/24 10:27 AM Phone: [phone] Fax: [phone]

## 2024-01-31 ENCOUNTER — Encounter: Payer: Self-pay | Admitting: Physical Therapy

## 2024-01-31 ENCOUNTER — Ambulatory Visit (INDEPENDENT_AMBULATORY_CARE_PROVIDER_SITE_OTHER): Admitting: Physical Therapy

## 2024-01-31 DIAGNOSIS — R262 Difficulty in walking, not elsewhere classified: Secondary | ICD-10-CM

## 2024-01-31 DIAGNOSIS — M25551 Pain in right hip: Secondary | ICD-10-CM

## 2024-01-31 DIAGNOSIS — G8929 Other chronic pain: Secondary | ICD-10-CM | POA: Diagnosis not present

## 2024-01-31 NOTE — Therapy (Signed)
 OUTPATIENT PHYSICAL THERAPY LOWER EXTREMITY NOTE   Patient Name: Shirley Fleming MRN: [id number] DOB:09-24-1975, 48 y.o., female Today's Date: 01/31/2024  END OF SESSION:  PT End of Session - 01/31/24 0802     Visit Number 4    Number of Visits 12    Date for Recertification  02/29/24    Authorization Type BC[employer]    PT Start Time 0800    PT Stop Time 0845    PT Time Calculation (min) 45 min    Activity Tolerance Patient tolerated treatment well    Behavior During Therapy WFL for tasks assessed/performed            Past Medical History:  Diagnosis Date   Allergy 1993   Seasonal; mold, cats, trees, dust   Anemia    takes iron- heavy menstral cycle   Anxiety    on meds   Asthma    uses inhaler with exercise; Follows w/ PCP.   Depression 1995   Off and on since teens   Empty sella    Partially empty sella per MR brain 05/07/22, Follows w/ neurololgy.   GERD (gastroesophageal reflux disease)    OTC PRN meds   History of COVID-19 12/23/2020   Hypertension Jan 2021   resolved per pt, no meds   Hypoglycemia    Inflamed seborrheic keratosis 2024   Follows w/ [facility].   Migraine with aura    Follows w/ Dr. Venetia Potters, neurologist. See 05/07/22 MRI of brain in Epic.   Palpitations    after having Covid, takes Propranolol  prn, saw cardiologist, Dr. Lynwood Schilling, 06/17/20 Long term monitor in Epic   Pneumonia    mother was a smoker, pt states growing up she had pneumonia and bronchitis several times   Seasonal allergies    Wears glasses    for driving only   Past Surgical History:  Procedure Laterality Date   ABDOMINAL HYSTERECTOMY  Nov 2024   APPENDECTOMY  2003   COLONOSCOPY  02/09/2023   1 - 4mm polyp   CYSTOSCOPY N/A 03/09/2023   Procedure: CYSTOSCOPY;  Surgeon: Glennon Almarie POUR, MD;  Location[facility]  Service: Gynecology;  Laterality: N/A;   ROBOTIC ASSISTED LAPAROSCOPIC HYSTERECTOMY AND SALPINGECTOMY Bilateral 03/09/2023    Procedure: XI ROBOTIC ASSISTED LAPAROSCOPIC HYSTERECTOMY AND SALPINGECTOMY;  Surgeon: Glennon Almarie POUR, MD;  Location: [facility];  Service: Gynecology;  Laterality: Bilateral;   TONSILLECTOMY AND ADENOIDECTOMY  1993   Patient Active Problem List   Diagnosis Date Noted   Multiple family members with Ehlers-Danlos syndrome 12/14/2023   At high risk for breast cancer 10/31/2021   Genetic testing 09/22/2021   Vitamin D deficiency 05/24/2020   Primary hypertension 05/24/2020   Migraine with aura and without status migrainosus, not intractable 05/24/2020   Anxiety 05/24/2020    PCP: Job Lukes PA   REFERRING PROVIDER: Joane Birmingham MD   REFERRING DIAG: [phone] (ICD-10-CM) - Chronic right hip pain  THERAPY DIAG:  Chronic right hip pain  Difficulty walking  Rationale for Evaluation and Treatment: Rehabilitation  ONSET DATE: acute on chronic?   SUBJECTIVE:   SUBJECTIVE STATEMENT: My hip is actually pretty good.    Patient is familiar to me as I have treated her children in the recent past.  Felipe has had therapy about 2 years ago and had to stop due to family issues.  She reports pain off and on since then but she cites that since her hysterectomy in November the  pain has gotten a bit worse.  She feels like since she was not as active and then started to do more the pain has been increased in her right hip.  Recently on a trip to [place] she walked and did stairs excessively and she had a severe flareup. I constantly feel like its shifting out of place. I thought she was not able to walk, get home, etc.  The pain was excruciating at that time She notices pain mostly in standing and walking she tries to keep her weight symmetrical but when she does not things get out of alignment including her knee. The pain radiates down my back of thigh and the groin, stops at the knee.  The whole leg feels like it is not aligned properly.  She started wearing a  compressive knee brace and that does seem to help.  She knows that she is generally a hypermobile person or at least she was when she was in her teens and 2s she feel other pain in neck, upper back and knees.  She used to be an avid exerciser doing [employer] and running and is disappointed that she is not able to do anything currently without pain.   PERTINENT HISTORY: History of familial Ehlers-Danlos syndrome  Foot pain, hysterectomy ,migraines depression  PAIN:  Are you having pain? Yes: NPRS scale: 2.10, typical day is 2-4, then in eve 6/10  Pain location: Rt hip lateral but can radiate post and ant Pain description: sharp, unstable, shifting of the joint, can take a couple of days to be totally better   Aggravating factors: walking, standing  Relieving factors: laying down, resting, propping, ice/heat, min relief with meds, holding her hip   PRECAUTIONS: None  RED FLAGS: None   WEIGHT BEARING RESTRICTIONS: No  FALLS:  Has patient fallen in last 6 months? 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   NEXT MD VISIT: A few weeks  OBJECTIVE:  Note: Objective measures were completed at Evaluation unless otherwise noted.  DIAGNOSTIC FINDINGS:  Normal XR recently   PATIENT SURVEYS:  LEFS  Extreme difficulty/unable (0), Quite a bit of difficulty (1), Moderate difficulty (2), Little difficulty (3), No difficulty (4) Survey date:  01/18/2024  Any of your usual work, housework or school activities 3  2. Usual hobbies, recreational or sporting activities 2  3.  Getting into/out of the bath 3  4. Walking between rooms 2  5. Putting on socks/shoes 4  6. Squatting  2  7. Lifting an object, like a bag of groceries from the floor 3  8. Performing light activities around your home 3  9. Performing heavy activities around your home 2  10. Getting into/out of a car 1  11. Walking 2 blocks 1  12. Walking 1 mile 1  13. Going up/down 10 stairs (1 flight) 2  14. Standing for 1 hour 1  15.  sitting for 1 hour 2  16. Running on even ground 1  17. Running on uneven ground 1  18. Making sharp turns while running  fast 1  19. Hopping  2  20. Rolling over in bed 2  Score total:  39/80     COGNITION: Overall cognitive status: Within functional limits for tasks assessed     SENSATION: Tingling Rt > LT. LE knee to feet   EDEMA:    MUSCLE LENGTH: WFL hamstrings and ant hip   POSTURE: knees hyperextended , mild swayback  PALPATION: Pain with palpation to the right anterolateral hip extending into soreness along the gluteus medius and minimus.  Min TTP  noted at the greater trochanter  LOWER EXTREMITY ROM: WNL   Passive ROM Right eval Left eval  Hip flexion    Hip extension    Hip abduction    Hip adduction    Hip internal rotation 30 Pain  20 no pain   Hip external rotation 50 45  Knee flexion    Knee extension    Ankle dorsiflexion    Ankle plantarflexion    Ankle inversion    Ankle eversion     (Blank rows = not tested)  LOWER EXTREMITY MMT:  MMT Right eval Left eval  Hip flexion 5 5  Hip extension 5 5  Hip abduction 4+/5 pain  5  Hip adduction 4+ pain  5  Hip internal rotation    Hip external rotation    Knee flexion 5 5  Knee extension 5 5  Ankle dorsiflexion 5 5  Ankle plantarflexion    Ankle inversion    Ankle eversion     (Blank rows = not tested)  LOWER EXTREMITY SPECIAL TESTS:  Hip special tests: Belvie (FABER) test: positive , Trendelenburg test: negative, Hip scouring test: positive , and Anterior hip  impingement test: positive   FUNCTIONAL TESTS:  5 times sit to stand: 12 SLS WNL bilateral  Squat Rt hip pinching good form  GAIT: No issues                                                                                                                                 TREATMENT DATE:   [facility] Adult PT Treatment:                                                DATE: 01/31/24 Therapeutic Exercise: Recumbent bike 6 min L 1  Therapeutic Activity: Footwork Double leg Parallel heels, toes narrow and wide Pilates V heels and toes narrow and wide  2 red 1 blue 1 yellow- blue band  Single leg  Heel in parallel and turnout 2 red 1 blue  Double leg heel raise and calf stretch, prancing Bridging wide with BTB x 10  Feet in Straps 1 Red 1 Blue:  Arcs, Circles, Squat x 10  Single leg in strap 1 red for stretching  Single leg stability   Scooter 1  red lunge, knees back    [facility] Adult PT Treatment:                                                DATE: 01/29/24 Therapeutic Exercise: Recumbent bike 6 min L 1  Supine posterior pelvic tilt over ball Bent knee fall out with ball  x 10  March x 10 each  SLR x 10  Added knee ext  Added Arm Arc (opp)  Therapeutic Activity: Sidelying footwork (hip) 2 red hip abd, ER  Hip adduction  Footwork  Double leg Parallel heels, toes narrow and wide 2 red 1 blue 1 Yellow  Double leg heel raise and calf stretch, prancing    [facility] Adult PT Treatment:                                                DATE: 01/24/24 Therapeutic Exercise:  recumbent bike for warm up Therapeutic Activity: Pilates Reformer used for LE/core strength, postural strength, lumbopelvic disassociation and core control.  Exercises included: Footwork  Double leg Parallel heels, toes narrow and wide Pilates V heels and toes narrow and wide  2 red 1 blue 1 Yellow  Single leg  Heel in parallel  Double leg heel raise and calf stretch, prancing  Bridging with ball x 10 x 2 sets, 2nd  set with band , clam with green band, cues for breathing   Feet in Straps 1 Red 1 Yellow Arcs, Circles, Squat x 10     PATIENT EDUCATION:  Education details: See above education method: Explanation, Demonstration, and Handouts Education comprehension: verbalized understanding, returned demonstration, and needs further education  HOME EXERCISE PROGRAM: Access Code: [email or web address] URL: [email or web address] Date: 01/18/2024 Prepared by: Delon Norma  Exercises - Supine Bilateral Hip Internal Rotation Stretch  - 1 x daily - 7 x weekly - 2 sets - 10 reps - 5-10 hold - CARS- Sidelying Hip Circles  - 1 x daily - 7 x weekly - 2 sets - 10 reps - Supine Bridge with Resistance Band  - 1 x daily - 7 x weekly - 2 sets - 10 reps - 5 hold - Standing Hip Flexor Stretch  - 1 x daily - 7 x weekly - 1 sets - 3 reps - 15-20 hold  ASSESSMENT:  CLINICAL IMPRESSION:  Patient was able to use Reformer today without increased pain, addressing hip mobility and core.  In standing, she was able to notice lateral hip/glute fatigue even with UE support.  Cues needed for getting into a proper hinge position and keeping pelvic alignment.    Patient is a 48 y.o. female who was seen today for physical therapy evaluation and treatment for hip pain.  She presents with signs and symptoms consistent with FAI/impingement  OBJECTIVE IMPAIRMENTS: decreased mobility, difficulty walking, decreased ROM, decreased strength, increased fascial restrictions, impaired flexibility, pain, and ?  Hypermobility.   ACTIVITY LIMITATIONS: carrying, lifting, bending, sitting, standing, squatting, sleeping, transfers, locomotion level, and caring for others  PARTICIPATION LIMITATIONS: cleaning, interpersonal relationship, driving, shopping, community activity, and occupation  PERSONAL FACTORS: Past/current experiences, Time since onset of injury/illness/exacerbation, and 1-2 comorbidities: history of hypermobility, polyarthralgia  are also affecting patient's functional outcome.   REHAB POTENTIAL: Excellent  CLINICAL DECISION MAKING: Evolving/moderate complexity  EVALUATION COMPLEXITY: Moderate   GOALS: Goals reviewed with patient? Yes  SHORT TERM GOALS: Target date: 02/15/2024   Patient will be able to show independence for initial HEP to include posture, core and hip strength and stability.   Baseline: Goal status: ongoing   2.  Patient will be I with concepts of joint protection and stability as it pertains to joint hypermobility. Baseline:  Goal status:ongoing   3.  Patient will report no hip pain with basic squat, sit to stand transfers Baseline:  Goal status:ongoing   4.  Patient will be able to walk 20 minutes with minimal increase in pain in the right hip Baseline:  Goal status: ongoing  LONG TERM GOALS: Target date: 03/14/2024    Patient will improve LEFS score by 9 points or more Baseline: 39/80 Goal status: INITIAL  2.  Patient be able to walk 30 minutes for exercise without increased right hip pain 75% of the time Baseline:  Goal status: INITIAL  3.  Patient will be independent with home exercise program upon discharge Baseline:  Goal status: INITIAL  4.  Patient will be able to carry light items up and down the stairs without increased hip pain  Baseline:  Goal status: INITIAL  5.  Further goals to be assessed if necessary Baseline:  Goal status: INITIAL   PLAN:  PT FREQUENCY: 1-2x/week  PT DURATION: 6 weeks  PLANNED INTERVENTIONS: 97164- PT Re-evaluation, 97750- Physical Performance Testing, 97110-Therapeutic exercises, 97530- Therapeutic activity, 97112- Neuromuscular re-education, 97535- Self Care, [id number]- Manual therapy, Patient/Family education, Balance training, Taping, Joint mobilization, Cryotherapy, and Moist heat  PLAN FOR NEXT SESSION: progress HEP progress core stability, improve hip mobility consider using the Pilates reformer or tower for max benefit,  functional goal   Mariem Skolnick, PT 01/31/2024, 9:28 AM   Delon Norma, PT 01/31/24 9:28 AM Phone: [phone] Fax: [phone]

## 2024-02-05 ENCOUNTER — Encounter: Payer: Self-pay | Admitting: Physical Therapy

## 2024-02-05 ENCOUNTER — Ambulatory Visit (HOSPITAL_BASED_OUTPATIENT_CLINIC_OR_DEPARTMENT_OTHER)
Admission: RE | Admit: 2024-02-05 | Discharge: 2024-02-05 | Disposition: A | Discharge status: Home / Self Care | Payer: Self-pay | Source: Ambulatory Visit | Attending: Physician Assistant | Admitting: Physician Assistant

## 2024-02-05 ENCOUNTER — Ambulatory Visit: Payer: Self-pay | Admitting: Physician Assistant

## 2024-02-05 ENCOUNTER — Ambulatory Visit: Admitting: Physical Therapy

## 2024-02-05 DIAGNOSIS — G8929 Other chronic pain: Secondary | ICD-10-CM

## 2024-02-05 DIAGNOSIS — M25551 Pain in right hip: Secondary | ICD-10-CM | POA: Diagnosis not present

## 2024-02-05 DIAGNOSIS — R262 Difficulty in walking, not elsewhere classified: Secondary | ICD-10-CM | POA: Diagnosis not present

## 2024-02-05 DIAGNOSIS — E785 Hyperlipidemia, unspecified: Secondary | ICD-10-CM | POA: Insufficient documentation

## 2024-02-05 NOTE — Therapy (Signed)
 OUTPATIENT PHYSICAL THERAPY LOWER EXTREMITY NOTE   Patient Name: Shirley Fleming MRN: [id number] DOB:[birth year], 48 y.o., female Today's Date: 02/05/2024  END OF SESSION:  PT End of Session - 02/05/24 0846     Visit Number 5    Number of Visits 12    Date for Recertification  02/29/24    Authorization Type [employer]    PT Start Time [phone]    PT Stop Time 0930    PT Time Calculation (min) 44 min    Activity Tolerance Patient tolerated treatment well    Behavior During Therapy WFL for tasks assessed/performed            Past Medical History:  Diagnosis Date   Allergy 1993   Seasonal; mold, cats, trees, dust   Anemia    takes iron- heavy menstral cycle   Anxiety    on meds   Asthma    uses inhaler with exercise; Follows w/ PCP.   Depression 1995   Off and on since teens   Empty sella    Partially empty sella per MR brain 05/07/22, Follows w/ neurololgy.   GERD (gastroesophageal reflux disease)    OTC PRN meds   History of COVID-19 12/23/2020   Hypertension Jan 2021   resolved per pt, no meds   Hypoglycemia    Inflamed seborrheic keratosis 2024   Follows w/ [facility].   Migraine with aura    Follows w/ Dr. Venetia Potters, neurologist. See 05/07/22 MRI of brain in Epic.   Palpitations    after having Covid, takes Propranolol  prn, saw cardiologist, Dr. Lynwood Schilling, 06/17/20 Long term monitor in Epic   Pneumonia    mother was a smoker, pt states growing up she had pneumonia and bronchitis several times   Seasonal allergies    Wears glasses    for driving only   Past Surgical History:  Procedure Laterality Date   ABDOMINAL HYSTERECTOMY  Nov 2024   APPENDECTOMY  2003   COLONOSCOPY  02/09/2023   1 - 4mm polyp   CYSTOSCOPY N/A 03/09/2023   Procedure: CYSTOSCOPY;  Surgeon: Glennon Almarie POUR, MD;  Location: [facility];  Service: Gynecology;  Laterality: N/A;   ROBOTIC ASSISTED LAPAROSCOPIC HYSTERECTOMY AND SALPINGECTOMY Bilateral 03/09/2023    Procedure: XI ROBOTIC ASSISTED LAPAROSCOPIC HYSTERECTOMY AND SALPINGECTOMY;  Surgeon: Glennon Almarie POUR, MD;  Location: [facility];  Service: Gynecology;  Laterality: Bilateral;   TONSILLECTOMY AND ADENOIDECTOMY  1993   Patient Active Problem List   Diagnosis Date Noted   Multiple family members with Ehlers-Danlos syndrome 12/14/2023   At high risk for breast cancer 10/31/2021   Genetic testing 09/22/2021   Vitamin D deficiency 05/24/2020   Primary hypertension 05/24/2020   Migraine with aura and without status migrainosus, not intractable 05/24/2020   Anxiety 05/24/2020    PCP: Job Lukes PA   REFERRING PROVIDER: Joane Birmingham MD   REFERRING DIAG: [phone] (ICD-10-CM) - Chronic right hip pain  THERAPY DIAG:  Chronic right hip pain  Difficulty walking  Rationale for Evaluation and Treatment: Rehabilitation  ONSET DATE: acute on chronic?   SUBJECTIVE:   SUBJECTIVE STATEMENT: I have no hip pain, my neck is sore and stiff. I had a touch of vertigo yesterday.   Went to [employer], walked around [facility].     Patient is familiar to me as I have treated her children in the recent past.  Shirley Fleming has had therapy about 2 years ago and had to  stop due to family issues.  She reports pain off and on since then but she cites that since her hysterectomy in November the pain has gotten a bit worse.  She feels like since she was not as active and then started to do more the pain has been increased in her right hip.  Recently on a trip to [place] she walked and did stairs excessively and she had a severe flareup. I constantly feel like its shifting out of place. I thought she was not able to walk, get home, etc.  The pain was excruciating at that time She notices pain mostly in standing and walking she tries to keep her weight symmetrical but when she does not things get out of alignment including her knee. The pain radiates down my back of thigh and the  groin, stops at the knee.  The whole leg feels like it is not aligned properly.  She started wearing a compressive knee brace and that does seem to help.  She knows that she is generally a hypermobile person or at least she was when she was in her teens and 65s she feel other pain in neck, upper back and knees.  She used to be an avid exerciser doing [employer] and running and is disappointed that she is not able to do anything currently without pain.   PERTINENT HISTORY: History of familial Ehlers-Danlos syndrome  Foot pain, hysterectomy ,migraines depression  PAIN:  Are you having pain? Yes: NPRS scale: 2.10, typical day is 2-4, then in eve 6/10  Pain location: Rt hip lateral but can radiate post and ant Pain description: sharp, unstable, shifting of the joint, can take a couple of days to be totally better   Aggravating factors: walking, standing  Relieving factors: laying down, resting, propping, ice/heat, min relief with meds, holding her hip   PRECAUTIONS: None  RED FLAGS: None   WEIGHT BEARING RESTRICTIONS: No  FALLS:  Has patient fallen in last 6 months? 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   NEXT MD VISIT: A few weeks  OBJECTIVE:  Note: Objective measures were completed at Evaluation unless otherwise noted.  DIAGNOSTIC FINDINGS:  Normal XR recently   PATIENT SURVEYS:  LEFS  Extreme difficulty/unable (0), Quite a bit of difficulty (1), Moderate difficulty (2), Little difficulty (3), No difficulty (4) Survey date:  01/18/2024  Any of your  usual work, housework or school activities 3  2. Usual hobbies, recreational or sporting activities 2  3. Getting into/out of the bath 3  4. Walking between rooms 2  5. Putting on socks/shoes 4  6. Squatting  2  7. Lifting an object, like a bag of groceries from the floor 3  8. Performing light activities around your home 3  9. Performing heavy activities around your home 2  10. Getting into/out of a car 1  11. Walking 2 blocks 1  12. Walking 1 mile 1  13. Going up/down 10 stairs (1 flight) 2  14. Standing for 1 hour 1  15.  sitting for  1 hour 2  16. Running on even ground 1  17. Running on uneven ground 1  18. Making sharp turns while running fast 1  19. Hopping  2  20. Rolling over in bed 2  Score total:  39/80     COGNITION: Overall cognitive status: Within functional limits for tasks assessed     SENSATION: Tingling Rt > LT. LE knee to feet   EDEMA:    MUSCLE LENGTH: WFL hamstrings and ant hip   POSTURE: knees hyperextended , mild swayback  PALPATION: Pain with palpation to the right anterolateral hip extending into soreness along the gluteus medius and minimus.  Min TTP  noted at the greater trochanter  LOWER EXTREMITY ROM: WNL   Passive ROM Right eval Left eval  Hip flexion    Hip extension    Hip abduction    Hip adduction    Hip internal rotation 30 Pain  20 no pain   Hip external rotation 50 45  Knee flexion    Knee extension    Ankle dorsiflexion    Ankle plantarflexion    Ankle inversion    Ankle eversion     (Blank rows = not tested)  LOWER EXTREMITY MMT:  MMT Right eval Left eval  Hip flexion 5 5  Hip extension 5 5  Hip abduction 4+/5 pain  5  Hip adduction 4+ pain  5  Hip internal rotation    Hip external rotation    Knee flexion 5 5  Knee extension 5 5  Ankle dorsiflexion 5 5  Ankle plantarflexion    Ankle inversion    Ankle eversion     (Blank rows = not tested)  LOWER EXTREMITY SPECIAL TESTS:  Hip special tests: Belvie  (FABER) test: positive , Trendelenburg test: negative, Hip scouring test: positive , and Anterior hip impingement test: positive   FUNCTIONAL TESTS:  5 times sit to stand: 12 SLS WNL bilateral  Squat Rt hip pinching good form  GAIT: No issues                                                                                                                                 TREATMENT DATE:   [facility] Adult PT Treatment:                                                DATE: 02/05/24 Therapeutic Exercise: Sit to stand 15 lbs Squat x 10 , 15 lbs   RDL 15 lbs mid back discomfort  Standing thoracic rotation at wall  RDL x 10 repeat  B stance 15 lbs x 10 near wall  Standing single leg hip hinge Therapeutic Activity: Scooter in the Reformer well  Hip extension 1 blue for on carriage Down stretch 1 red 1 yellow  Knee stretch 1  red  1 yellow  Mermaid 1 red x 5 with rotation    [facility] Adult PT Treatment:                                                DATE: 01/31/24 Therapeutic Exercise: Recumbent bike 6 min L 1  Therapeutic Activity: Footwork Double leg Parallel heels, toes narrow and wide Pilates V heels and toes narrow and wide  2 red 1 blue 1 yellow- blue band  Single leg  Heel in parallel and turnout 2 red 1 blue  Double leg heel raise and calf stretch, prancing Bridging wide with BTB x 10  Feet in Straps 1 Red 1 Blue:  Arcs, Circles, Squat x 10  Single leg in strap 1 red for stretching  Single leg stability   Scooter 1 red lunge, knees back     PATIENT EDUCATION:  Education details: See above education method: Explanation, Demonstration, and Handouts Education comprehension: verbalized understanding, returned demonstration, and needs further education  HOME EXERCISE PROGRAM: Access Code: [email or web address] URL: [email or web address] Date: 01/18/2024 Prepared by: Delon Norma  Exercises - Supine Bilateral Hip Internal Rotation Stretch  - 1 x daily - 7 x weekly - 2 sets -  10 reps - 5-10 hold - CARS- Sidelying Hip Circles  - 1 x daily - 7 x weekly - 2 sets - 10 reps - Supine Bridge with Resistance Band  - 1 x daily - 7 x weekly - 2 sets - 10 reps - 5 hold - Standing Hip Flexor Stretch  - 1 x daily - 7 x weekly - 1 sets - 3 reps - 15-20 hold  ASSESSMENT:  CLINICAL IMPRESSION: Patient reports overall less pain in her hip but did have an episode yesterday with vertigo.  She was able to work on standing strengthening compound movements with a 15 pound kettle bell without increasing hip pain.  She does need minimal cueing to keep her pelvis level with single-leg exercises and we did take that to the reformer for a few exercises there addressing balance and control.   Patient is a 48 y.o. female who was seen today for physical therapy evaluation and treatment for hip pain.  She presents with signs and symptoms consistent with FAI/impingement  OBJECTIVE IMPAIRMENTS: decreased mobility, difficulty walking, decreased ROM, decreased strength, increased fascial restrictions, impaired flexibility, pain, and ?  Hypermobility.   ACTIVITY LIMITATIONS: carrying, lifting, bending, sitting, standing, squatting, sleeping, transfers, locomotion level, and caring for others  PARTICIPATION LIMITATIONS: cleaning, interpersonal relationship, driving, shopping, community activity, and occupation  PERSONAL FACTORS: Past/current experiences, Time since onset of injury/illness/exacerbation, and 1-2 comorbidities: history of hypermobility, polyarthralgia are also affecting patient's functional outcome.   REHAB POTENTIAL: Excellent  CLINICAL DECISION MAKING: Evolving/moderate complexity  EVALUATION COMPLEXITY: Moderate   GOALS: Goals reviewed with patient? Yes  SHORT TERM GOALS: Target date: 02/15/2024   Patient will be able to show independence for initial HEP to include posture, core and hip strength and stability.   Baseline: Goal status: ongoing   2.  Patient will be I with  concepts of joint protection and stability as it pertains to joint hypermobility. Baseline:  Goal status:ongoing   3.  Patient will report no hip pain with basic squat, sit to stand transfers Baseline:  Goal status:ongoing   4.  Patient will be able to walk 20 minutes with  minimal increase in pain in the right hip Baseline:  Goal status: ongoing  LONG TERM GOALS: Target date: 03/14/2024    Patient will improve LEFS score by 9 points or more Baseline: 39/80 Goal status: INITIAL  2.  Patient be able to walk 30 minutes for exercise without increased right hip pain 75% of the time Baseline:  Goal status: INITIAL  3.  Patient will be independent with home exercise program upon discharge Baseline:  Goal status: INITIAL  4.  Patient will be able to carry light items up and down the stairs without increased hip pain  Baseline:  Goal status: INITIAL  5.  Further goals to be assessed if necessary Baseline:  Goal status: INITIAL   PLAN:  PT FREQUENCY: 1-2x/week  PT DURATION: 6 weeks  PLANNED INTERVENTIONS: 97164- PT Re-evaluation, 97750- Physical Performance Testing, 97110-Therapeutic exercises, 97530- Therapeutic activity, 97112- Neuromuscular re-education, 97535- Self Care, [id number]- Manual therapy, Patient/Family education, Balance training, Taping, Joint mobilization, Cryotherapy, and Moist heat  PLAN FOR NEXT SESSION: progress HEP progress core stability, improve hip mobility consider using the Pilates reformer or tower for max benefit, functional goal   Laron Angelini, PT 02/05/2024, 9:16 AM   Delon Norma, PT 02/05/24 9:16 AM Phone: [phone] Fax: [phone]

## 2024-02-07 ENCOUNTER — Ambulatory Visit (INDEPENDENT_AMBULATORY_CARE_PROVIDER_SITE_OTHER): Admitting: Physical Therapy

## 2024-02-07 ENCOUNTER — Encounter: Payer: Self-pay | Admitting: Physical Therapy

## 2024-02-07 DIAGNOSIS — R262 Difficulty in walking, not elsewhere classified: Secondary | ICD-10-CM

## 2024-02-07 DIAGNOSIS — G8929 Other chronic pain: Secondary | ICD-10-CM

## 2024-02-07 DIAGNOSIS — M25551 Pain in right hip: Secondary | ICD-10-CM | POA: Diagnosis not present

## 2024-02-07 NOTE — Therapy (Signed)
 OUTPATIENT PHYSICAL THERAPY LOWER EXTREMITY NOTE   Patient Name: Shirley Fleming MRN: [id number] DOB:01-16-1976, 48 y.o., female Today's Date: 02/07/2024  END OF SESSION:  PT End of Session - 02/07/24 1018     Visit Number 6    Number of Visits 12    Date for Recertification  02/29/24    Authorization Type BC[employer]    PT Start Time 1020    PT Stop Time 1100    PT Time Calculation (min) 40 min             Past Medical History:  Diagnosis Date   Allergy 1993   Seasonal; mold, cats, trees, dust   Anemia    takes iron- heavy menstral cycle   Anxiety    on meds   Asthma    uses inhaler with exercise; Follows w/ PCP.   Depression 1995   Off and on since teens   Empty sella    Partially empty sella per MR brain 05/07/22, Follows w/ neurololgy.   GERD (gastroesophageal reflux disease)    OTC PRN meds   History of COVID-19 12/23/2020   Hypertension Jan 2021   resolved per pt, no meds   Hypoglycemia    Inflamed seborrheic keratosis 2024   Follows w/ [facility].   Migraine with aura    Follows w/ Dr. Venetia Potters, neurologist. See 05/07/22 MRI of brain in Epic.   Palpitations    after having Covid, takes Propranolol  prn, saw cardiologist, Dr. Lynwood Schilling, 06/17/20 Long term monitor in Epic   Pneumonia    mother was a smoker, pt states growing up she had pneumonia and bronchitis several times   Seasonal allergies    Wears glasses    for driving only   Past Surgical History:  Procedure Laterality Date   ABDOMINAL HYSTERECTOMY  Nov 2024   APPENDECTOMY  2003   COLONOSCOPY  02/09/2023   1 - 4mm polyp   CYSTOSCOPY N/A 03/09/2023   Procedure: CYSTOSCOPY;  Surgeon: Glennon Almarie POUR, MD;  Location: [facility] [place];  Service: Gynecology;  Laterality: N/A;   ROBOTIC ASSISTED LAPAROSCOPIC HYSTERECTOMY AND SALPINGECTOMY Bilateral 03/09/2023   Procedure: XI ROBOTIC ASSISTED LAPAROSCOPIC HYSTERECTOMY AND SALPINGECTOMY;  Surgeon: Glennon Almarie POUR, MD;   Location: [facility] [place];  Service: Gynecology;  Laterality: Bilateral;   TONSILLECTOMY AND ADENOIDECTOMY  1993   Patient Active Problem List   Diagnosis Date Noted   Multiple family members with Ehlers-Danlos syndrome 12/14/2023   At high risk for breast cancer 10/31/2021   Genetic testing 09/22/2021   Vitamin D deficiency 05/24/2020   Primary hypertension 05/24/2020   Migraine with aura and without status migrainosus, not intractable 05/24/2020   Anxiety 05/24/2020    PCP: Job Lukes PA   REFERRING PROVIDER: Joane Birmingham MD   REFERRING DIAG: [phone] (ICD-10-CM) - Chronic right hip pain  THERAPY DIAG:  Chronic right hip pain  Difficulty walking  Rationale for Evaluation and Treatment: Rehabilitation  ONSET DATE: acute on chronic?   SUBJECTIVE:   SUBJECTIVE STATEMENT: Pain in hip, 4/10 when I got up.  Did recumbent bike and the TM last night.    Patient is familiar to me as I have treated her children in the recent past.  Khristen has had therapy about 2 years ago and had to stop due to family issues.  She reports pain off and on since then but she cites that since her hysterectomy in November the pain has gotten a bit worse.  She feels like since she was not as active and then started to do more the pain has been increased in her right hip.  Recently on a trip to [place] she walked and did stairs excessively and she had a severe flareup. I constantly feel like its shifting out of place. I thought she was not able to walk, get home, etc.  The pain was excruciating at that time She notices pain mostly in standing and walking she tries to keep her weight symmetrical but when she does not things get out of alignment including her knee. The pain radiates down my back of thigh and the groin, stops at the knee.  The whole leg feels like it is not aligned properly.  She started wearing a compressive knee brace and that does seem to help.  She knows that she is  generally a hypermobile person or at least she was when she was in her teens and 5s she feel other pain in neck, upper back and knees.  She used to be an avid exerciser doing [employer] and running and is disappointed that she is not able to do anything currently without pain.   PERTINENT HISTORY: History of familial Ehlers-Danlos syndrome  Foot pain, hysterectomy ,migraines depression  PAIN:  Are you having pain? Yes: NPRS scale: 2.10, typical day is 2-4, then in eve 6/10  Pain location: Rt hip lateral but can radiate post and ant Pain description: sharp, unstable, shifting of the joint, can take a couple of days to be totally better   Aggravating factors: walking, standing  Relieving factors: laying down, resting, propping, ice/heat, min relief with meds, holding her hip   PRECAUTIONS: None  RED FLAGS: None   WEIGHT BEARING RESTRICTIONS: No  FALLS:  Has patient fallen in last 6 months? 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   NEXT MD VISIT: A few weeks  OBJECTIVE:  Note: Objective measures were completed at Evaluation unless otherwise noted.  DIAGNOSTIC FINDINGS:  Normal XR recently   PATIENT SURVEYS:  LEFS  Extreme difficulty/unable (0), Quite a bit of difficulty (1), Moderate difficulty (2), Little difficulty (3), No difficulty (4) Survey date:  01/18/2024  Any of your usual work, housework or school activities 3  2. Usual hobbies, recreational or sporting activities 2  3. Getting into/out of the bath 3  4. Walking between rooms 2  5. Putting  on socks/shoes 4  6. Squatting  2  7. Lifting an object, like a bag of groceries from the floor 3  8. Performing light activities around your home 3  9. Performing heavy activities around your home 2  10. Getting into/out of a car 1  11. Walking 2 blocks 1  12. Walking 1 mile 1  13. Going up/down 10 stairs (1 flight) 2  14. Standing for 1 hour 1  15.  sitting for 1 hour 2  16. Running on even ground 1  17. Running on uneven ground 1  18. Making sharp turns while running fast 1  19. Hopping  2  20. Rolling over in bed 2  Score total:  39/80     COGNITION: Overall cognitive status: Within functional limits for tasks assessed     SENSATION: Tingling Rt > LT. LE knee to feet   EDEMA:    MUSCLE LENGTH: WFL hamstrings and ant hip   POSTURE: knees hyperextended , mild swayback  PALPATION: Pain with palpation to the right anterolateral hip extending into soreness along the gluteus medius and minimus.  Min TTP  noted at the greater trochanter  LOWER EXTREMITY ROM: WNL   Passive ROM Right eval Left eval  Hip flexion    Hip extension    Hip abduction    Hip adduction    Hip internal rotation 30 Pain  20 no pain   Hip external rotation 50 45  Knee flexion    Knee extension    Ankle dorsiflexion    Ankle plantarflexion    Ankle inversion    Ankle eversion     (Blank rows = not tested)  LOWER EXTREMITY MMT:  MMT Right eval Left eval  Hip flexion 5 5  Hip extension 5 5  Hip abduction 4+/5 pain  5  Hip adduction 4+ pain  5  Hip internal rotation    Hip external rotation    Knee flexion 5 5  Knee extension 5 5  Ankle dorsiflexion 5 5  Ankle plantarflexion    Ankle inversion    Ankle eversion     (Blank rows = not tested)  LOWER EXTREMITY SPECIAL TESTS:  Hip special tests: Belvie (FABER) test: positive , Trendelenburg test: negative, Hip scouring test: positive , and Anterior hip impingement test: positive   FUNCTIONAL TESTS:  5 times sit to stand: 12 SLS  WNL bilateral  Squat Rt hip pinching good form  GAIT: No issues                                                                                                                                 TREATMENT DATE:   [facility] Adult PT Treatment:                                                DATE: 01/1624 Therapeutic Activity/Exercise: Knee to chest  LTR with feet wide Bridging with blue band  x 10  Bridge with clam blue band x 10  Bridge with march  Clam, hip abduction and side plank 30 sec x 2  Neuromuscular re-ed: Feet in Straps 1 red 1 blue Arcs in V, I and toes in x 10 each  Frog squats 1 red 1 blue   Supine arms 1 red 1 yellow Arcs, triceps extended knees as well for more abdominals    [facility] Adult PT Treatment:  DATE: 02/05/24 Therapeutic Exercise: Sit to stand 15 lbs Squat x 10 , 15 lbs   RDL 15 lbs mid back discomfort  Standing thoracic rotation at wall  RDL x 10 repeat  B stance 15 lbs x 10 near wall  Standing single leg hip hinge Therapeutic Activity: Scooter in the Reformer well  Hip extension 1 blue for on carriage Down stretch 1 red 1 yellow  Knee stretch 1 red  1 yellow  Mermaid 1 red x 5 with rotation    [facility] Adult PT Treatment:                                                DATE: 01/31/24 Therapeutic Exercise: Recumbent bike 6 min L 1  Therapeutic Activity: Footwork Double leg Parallel heels, toes narrow and wide Pilates V heels and toes narrow and wide  2 red 1 blue 1 yellow- blue band  Single leg  Heel in parallel and turnout 2 red 1 blue  Double leg heel raise and calf stretch, prancing Bridging wide with BTB x 10  Feet in Straps 1 Red 1 Blue:  Arcs, Circles, Squat x 10  Single leg in strap 1 red for stretching  Single leg stability   Scooter 1 red lunge, knees back     PATIENT EDUCATION:  Education details: See above education method: Explanation, Demonstration, and Handouts Education comprehension:  verbalized understanding, returned demonstration, and needs further education  HOME EXERCISE PROGRAM: Access Code: [email or web address] URL: [email or web address] Date: 01/18/2024 Prepared by: Delon Norma  Exercises - Supine Bilateral Hip Internal Rotation Stretch  - 1 x daily - 7 x weekly - 2 sets - 10 reps - 5-10 hold - CARS- Sidelying Hip Circles  - 1 x daily - 7 x weekly - 2 sets - 10 reps - Supine Bridge with Resistance Band  - 1 x daily - 7 x weekly - 2 sets - 10 reps - 5 hold - Standing Hip Flexor Stretch  - 1 x daily - 7 x weekly - 1 sets - 3 reps - 15-20 hold  ASSESSMENT:  CLINICAL IMPRESSION:  Patient with mild hip pain today, not limited during the session.  She is hoping to try a community Pilates class this weekend.  She was able to lift head and neck up into an upper ab curl but typically avoids this due to cervical pain and instability. Cont POC.     Patient is a 48 y.o. female who was seen today for physical therapy evaluation and treatment for hip pain.  She presents with signs and symptoms consistent with FAI/impingement  OBJECTIVE IMPAIRMENTS: decreased mobility, difficulty walking, decreased ROM, decreased strength, increased fascial restrictions, impaired flexibility, pain, and ?  Hypermobility.   ACTIVITY LIMITATIONS: carrying, lifting, bending, sitting, standing, squatting, sleeping, transfers, locomotion level, and caring for others  PARTICIPATION LIMITATIONS: cleaning, interpersonal relationship, driving, shopping, community activity, and occupation  PERSONAL FACTORS: Past/current experiences, Time since onset of injury/illness/exacerbation, and 1-2 comorbidities: history of hypermobility, polyarthralgia are also affecting patient's functional outcome.   REHAB POTENTIAL: Excellent  CLINICAL DECISION MAKING: Evolving/moderate complexity  EVALUATION COMPLEXITY: Moderate   GOALS: Goals reviewed with patient? Yes  SHORT TERM GOALS: Target date:  02/15/2024   Patient will be able to show independence for initial HEP to include posture, core and hip strength and stability.   Baseline: Goal status: ongoing  2.  Patient will be I with concepts of joint protection and stability as it pertains to joint hypermobility. Baseline:  Goal status:ongoing   3.  Patient will report no hip pain with basic squat, sit to stand transfers Baseline:  Goal status:ongoing   4.  Patient will be able to walk 20 minutes with minimal increase in pain in the right hip Baseline:  Goal status: ongoing  LONG TERM GOALS: Target date: 03/14/2024    Patient will improve LEFS score by 9 points or more Baseline: 39/80 Goal status: INITIAL  2.  Patient be able to walk 30 minutes for exercise without increased right hip pain 75% of the time Baseline:  Goal status: INITIAL  3.  Patient will be independent with home exercise program upon discharge Baseline:  Goal status: INITIAL  4.  Patient will be able to carry light items up and down the stairs without increased hip pain  Baseline:  Goal status: INITIAL  5.  Further goals to be assessed if necessary Baseline:  Goal status: INITIAL   PLAN:  PT FREQUENCY: 1-2x/week  PT DURATION: 6 weeks  PLANNED INTERVENTIONS: 97164- PT Re-evaluation, 97750- Physical Performance Testing, 97110-Therapeutic exercises, 97530- Therapeutic activity, 97112- Neuromuscular re-education, 97535- Self Care, [id number]- Manual therapy, Patient/Family education, Balance training, Taping, Joint mobilization, Cryotherapy, and Moist heat  PLAN FOR NEXT SESSION: progress HEP progress core stability, improve hip mobility consider using the Pilates reformer or tower for max benefit, functional goal   Jayr Lupercio, PT 02/07/2024, 10:18 AM   Delon Norma, PT 02/07/24 10:18 AM Phone: [phone] Fax: [phone]

## 2024-02-11 NOTE — Therapy (Unsigned)
 OUTPATIENT PHYSICAL THERAPY LOWER EXTREMITY NOTE   Patient Name: Shirley Fleming MRN: [id number] DOB:17-Sep-1975, 48 y.o., female Today's Date: 02/12/2024  END OF SESSION:  PT End of Session - 02/12/24 0942     Visit Number 7    Number of Visits 12    Date for Recertification  02/29/24    Authorization Type BC[employer]    PT Start Time 0930    PT Stop Time 1015    PT Time Calculation (min) 45 min    Activity Tolerance Patient tolerated treatment well    Behavior During Therapy WFL for tasks assessed/performed              Past Medical History:  Diagnosis Date   Allergy 1993   Seasonal; mold, cats, trees, dust   Anemia    takes iron- heavy menstral cycle   Anxiety    on meds   Asthma    uses inhaler with exercise; Follows w/ PCP.   Depression 1995   Off and on since teens   Empty sella    Partially empty sella per MR brain 05/07/22, Follows w/ neurololgy.   GERD (gastroesophageal reflux disease)    OTC PRN meds   History of COVID-19 12/23/2020   Hypertension Jan 2021   resolved per pt, no meds   Hypoglycemia    Inflamed seborrheic keratosis 2024   Follows w/ [facility].   Migraine with aura    Follows w/ Dr. Venetia Potters, neurologist. See 05/07/22 MRI of brain in Epic.   Palpitations    after having Covid, takes Propranolol  prn, saw cardiologist, Dr. Lynwood Schilling, 06/17/20 Long term monitor in Epic   Pneumonia    mother was a smoker, pt states growing up she had pneumonia and bronchitis several times   Seasonal allergies    Wears glasses    for driving only   Past Surgical History:  Procedure Laterality Date   ABDOMINAL HYSTERECTOMY  Nov 2024   APPENDECTOMY  2003   COLONOSCOPY  02/09/2023   1 - 4mm polyp   CYSTOSCOPY N/A 03/09/2023   Procedure: CYSTOSCOPY;  Surgeon: Glennon Almarie POUR, MD;  Location: [facility];  Service: Gynecology;  Laterality: N/A;   ROBOTIC ASSISTED LAPAROSCOPIC HYSTERECTOMY AND SALPINGECTOMY Bilateral  03/09/2023   Procedure: XI ROBOTIC ASSISTED LAPAROSCOPIC HYSTERECTOMY AND SALPINGECTOMY;  Surgeon: Glennon Almarie POUR, MD;  Location: [facility];  Service: Gynecology;  Laterality: Bilateral;   TONSILLECTOMY AND ADENOIDECTOMY  1993   Patient Active Problem List   Diagnosis Date Noted   Multiple family members with Ehlers-Danlos syndrome 12/14/2023   At high risk for breast cancer 10/31/2021   Genetic testing 09/22/2021   Vitamin D deficiency 05/24/2020   Primary hypertension 05/24/2020   Migraine with aura and without status migrainosus, not intractable 05/24/2020   Anxiety 05/24/2020    PCP: Job Lukes PA   REFERRING PROVIDER: Joane Birmingham MD   REFERRING DIAG: [phone] (ICD-10-CM) - Chronic right hip pain  THERAPY DIAG:  Chronic right hip pain  Difficulty walking  Rationale for Evaluation and Treatment: Rehabilitation  ONSET DATE: acute on chronic?   SUBJECTIVE:   SUBJECTIVE STATEMENT: Pain in L shoulder, unsure of the reason.  May have slept on it funny. She felt it after Pilates but it did not hurt until Sunday PM.  Tightened up and Monday was worse.  Pain this AM was severe 10/10 its 4/10 now.     Patient is familiar to me as I have treated  her children in the recent past.  Shirley Fleming has had therapy about 2 years ago and had to stop due to family issues.  She reports pain off and on since then but she cites that since her hysterectomy in November the pain has gotten a bit worse.  She feels like since she was not as active and then started to do more the pain has been increased in her right hip.  Recently on a trip to [place] she walked and did stairs excessively and she had a severe flareup. I constantly feel like its shifting out of place. I thought she was not able to walk, get home, etc.  The pain was excruciating at that time She notices pain mostly in standing and walking she tries to keep her weight symmetrical but when she does not things  get out of alignment including her knee. The pain radiates down my back of thigh and the groin, stops at the knee.  The whole leg feels like it is not aligned properly.  She started wearing a compressive knee brace and that does seem to help.  She knows that she is generally a hypermobile person or at least she was when she was in her teens and 39s she feel other pain in neck, upper back and knees.  She used to be an avid exerciser doing [employer] and running and is disappointed that she is not able to do anything currently without pain.   PERTINENT HISTORY: History of familial Ehlers-Danlos syndrome  Foot pain, hysterectomy ,migraines depression  PAIN:  Are you having pain? Yes: NPRS scale: 4/10 Pain location: Rt shoulder Pain description: tight Aggravating factors: using her arm  Relieving factors: Aleve     Yes: NPRS scale: 1/10 hip Rt hip getting better  PRECAUTIONS: None  RED FLAGS: None   WEIGHT BEARING RESTRICTIONS: No  FALLS:  Has patient fallen in last 6 months? 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   NEXT MD VISIT: A few weeks  OBJECTIVE:  Note: Objective measures were completed at Evaluation unless otherwise noted.  DIAGNOSTIC FINDINGS:  Normal XR recently   PATIENT SURVEYS:  LEFS  Extreme difficulty/unable (0), Quite a bit of difficulty (1), Moderate difficulty (2), Little difficulty (3), No difficulty (4)    COGNITION: Overall cognitive status: Within functional limits for tasks assessed     SENSATION: Tingling Rt > LT. LE knee  to feet   EDEMA:    MUSCLE LENGTH: WFL hamstrings and ant hip   POSTURE: knees hyperextended , mild swayback  PALPATION: Pain with palpation to the right anterolateral hip extending into soreness along the gluteus medius and minimus.  Min TTP  noted at the greater trochanter  LOWER EXTREMITY ROM: WNL   Passive ROM Right eval Left eval  Hip flexion    Hip extension    Hip abduction    Hip adduction    Hip internal rotation 30 Pain  20 no pain   Hip external rotation 50 45  Knee flexion    Knee extension  Ankle dorsiflexion    Ankle plantarflexion    Ankle inversion    Ankle eversion     (Blank rows = not tested)  LOWER EXTREMITY MMT:  MMT Right eval Left eval  Hip flexion 5 5  Hip extension 5 5  Hip abduction 4+/5 pain  5  Hip adduction 4+ pain  5  Hip internal rotation    Hip external rotation    Knee flexion 5 5  Knee extension 5 5  Ankle dorsiflexion 5 5  Ankle plantarflexion    Ankle inversion    Ankle eversion     (Blank rows = not tested)  LOWER EXTREMITY SPECIAL TESTS:  Hip special tests: Belvie (FABER) test: positive , Trendelenburg test: negative, Hip scouring test: positive , and Anterior hip impingement test: positive   FUNCTIONAL TESTS:  5 times sit to stand: 12 SLS WNL bilateral  Squat Rt hip pinching good form                                                                                                                                TREATMENT DATE:    [facility] Adult PT Treatment:                                                DATE: 02/11/24  Therapeutic Exercise: PROM Rt UE Isometrics x 5 ER, IR flex, ext Row, extension Green  ER/IR red band standing  Flexion with dowel to 90 deg   Manual: KT tape x 2 Y along Rt deltoid and then horizontal   Therapeutic Activity: Pilates Tower for LE/Core strength, postural strength, lumbopelvic disassociation and core control.  Exercises included: Supine Leg Springs: single leg yellow arcs  and circles  Double leg arcs and squats    [facility] Adult PT Treatment:                                                DATE: 01/1624 Therapeutic Activity/Exercise: Knee to chest  LTR with feet wide Bridging with blue band  x 10  Bridge with clam blue band x 10  Bridge with march  Clam, hip abduction and side plank 30 sec x 2  Neuromuscular re-ed: Feet in Straps 1 red 1 blue Arcs in V, I and toes in x 10 each  Frog squats 1 red 1 blue   Supine arms 1 red 1 yellow Arcs, triceps extended knees as well for more abdominals    PATIENT EDUCATION:  Education details: See above education method: Explanation, Demonstration, and Handouts Education comprehension: verbalized understanding, returned demonstration, and needs further education  HOME EXERCISE PROGRAM: Access Code: [email or web address] URL: [email or web address] Date: 01/18/2024 Prepared by: Shirley  Adie Fleming  Exercises - Supine Bilateral Hip Internal Rotation Stretch  - 1 x daily - 7 x weekly - 2 sets - 10 reps - 5-10 hold - CARS- Sidelying Hip Circles  - 1 x daily - 7 x weekly - 2 sets - 10 reps - Supine Bridge with Resistance Band  - 1 x daily - 7 x weekly - 2 sets - 10 reps - 5 hold - Standing Hip Flexor Stretch  - 1 x daily - 7 x weekly - 1 sets - 3 reps - 15-20 hold  ASSESSMENT:  CLINICAL IMPRESSION:  Patient with increased shoulder pain today since Sat/Sun.  Pain with external rotation (resisted) and flexion, abduction against gravity.   Rt shoulder appeared more forward than the Lt.  We used tape to provide support.  Finished session with Leg Springs on [employer]  She has improved hip pain since beginning PT.    Patient is a 48 y.o. female who was seen today for physical therapy evaluation and treatment for hip pain.  She presents with signs and symptoms consistent with FAI/impingement  OBJECTIVE IMPAIRMENTS: decreased mobility, difficulty walking, decreased ROM, decreased strength, increased fascial restrictions, impaired  flexibility, pain, and ?  Hypermobility.   ACTIVITY LIMITATIONS: carrying, lifting, bending, sitting, standing, squatting, sleeping, transfers, locomotion level, and caring for others  PARTICIPATION LIMITATIONS: cleaning, interpersonal relationship, driving, shopping, community activity, and occupation  PERSONAL FACTORS: Past/current experiences, Time since onset of injury/illness/exacerbation, and 1-2 comorbidities: history of hypermobility, polyarthralgia are also affecting patient's functional outcome.   REHAB POTENTIAL: Excellent  CLINICAL DECISION MAKING: Evolving/moderate complexity  EVALUATION COMPLEXITY: Moderate   GOALS: Goals reviewed with patient? Yes  SHORT TERM GOALS: Target date: 02/15/2024   Patient will be able to show independence for initial HEP to include posture, core and hip strength and stability.   Baseline: Goal status: ongoing   2.  Patient will be I with concepts of joint protection and stability as it pertains to joint hypermobility. Baseline:  Goal status:ongoing   3.  Patient will report no hip pain with basic squat, sit to stand transfers Baseline:  Goal status:ongoing   4.  Patient will be able to walk 20 minutes with minimal increase in pain in the right hip Baseline:  Goal status: ongoing  LONG TERM GOALS: Target date: 03/14/2024    Patient will improve LEFS score by 9 points or more Baseline: 39/80 Goal status: INITIAL  2.  Patient be able to walk 30 minutes for exercise without increased right hip pain 75% of the time Baseline:  Goal status: INITIAL  3.  Patient will be independent with home exercise program upon discharge Baseline:  Goal status: INITIAL  4.  Patient will be able to carry light items up and down the stairs without increased hip pain  Baseline:  Goal status: INITIAL  5.  Further goals to be assessed if necessary Baseline:  Goal status: INITIAL   PLAN:  PT FREQUENCY: 1-2x/week  PT DURATION: 6  weeks  PLANNED INTERVENTIONS: 97164- PT Re-evaluation, 97750- Physical Performance Testing, 97110-Therapeutic exercises, 97530- Therapeutic activity, 97112- Neuromuscular re-education, 97535- Self Care, [id number]- Manual therapy, Patient/Family education, Balance training, Taping, Joint mobilization, Cryotherapy, and Moist heat  PLAN FOR NEXT SESSION: progress HEP progress core stability, improve hip mobility consider using the Pilates reformer or tower for max benefit, functional goal   Francena Zender, PT 02/12/2024, 10:45 AM   Shirley Norma, PT 02/12/24 10:45 AM Phone: [phone] Fax: [phone]

## 2024-02-12 ENCOUNTER — Encounter: Payer: Self-pay | Admitting: Physical Therapy

## 2024-02-12 ENCOUNTER — Ambulatory Visit (INDEPENDENT_AMBULATORY_CARE_PROVIDER_SITE_OTHER): Admitting: Physical Therapy

## 2024-02-12 DIAGNOSIS — G8929 Other chronic pain: Secondary | ICD-10-CM | POA: Diagnosis not present

## 2024-02-12 DIAGNOSIS — R262 Difficulty in walking, not elsewhere classified: Secondary | ICD-10-CM

## 2024-02-12 DIAGNOSIS — M25551 Pain in right hip: Secondary | ICD-10-CM

## 2024-02-13 ENCOUNTER — Ambulatory Visit (INDEPENDENT_AMBULATORY_CARE_PROVIDER_SITE_OTHER): Admitting: Family Medicine

## 2024-02-13 VITALS — BP 110/82 | HR 71 | Ht 65.5 in | Wt 188.0 lb

## 2024-02-13 DIAGNOSIS — G8929 Other chronic pain: Secondary | ICD-10-CM

## 2024-02-13 DIAGNOSIS — M357 Hypermobility syndrome: Secondary | ICD-10-CM | POA: Diagnosis not present

## 2024-02-13 DIAGNOSIS — M25511 Pain in right shoulder: Secondary | ICD-10-CM | POA: Diagnosis not present

## 2024-02-13 NOTE — Patient Instructions (Addendum)
 Thank you for coming in today.   Finish out physical therapy and continue home exercises  Check back with me as needed

## 2024-02-13 NOTE — Progress Notes (Signed)
   LILLETTE Ileana Collet, PhD, LAT, ATC acting as a scribe for Artist Lloyd, MD.  Shirley Fleming is a 48 y.o. female who presents to [facility] today for f/u L foot and R hip pain. Pt was last seen by Dr. Lloyd on 12/14/23 and was advised on shoe modification and referred to PT, completing 7 visits.  Today, pt reports L foot is feeling good. R hip pain is somewhat better and she notes improved strength. She notes possibly over doing it at pilates on Saturday and was having severe R shoulder pain. This mostly resolved after the PT visit yesterday.  She has a few more physical therapy visits left. Dx imaging: 08/09/21 R hip & L-spine XR   Pertinent review of systems: No fevers or chills  Relevant historical information: Hypermobility syndrome   Exam:  BP 110/82   Pulse 71   Ht 5' 5.5 (1.664 m)   Wt 188 lb (85.3 kg)   LMP 02/11/2023 (Exact Date)   SpO2 99%   BMI 30.81 kg/m  General: Well Developed, well nourished, and in no acute distress.   MSK: Right shoulder Kinesiotape normal motion    Lab and Radiology Results No results found for this or any previous visit (from the past 72 hours). No results found.     Assessment and Plan: 48 y.o. female with overall improving pain from hypermobility with physical therapy.  Will authorize PT to work more on the shoulder for as long as it needs to.  She has a pretty good handle on this and I do not think we will need ongoing PT but will need it sporadically in the future.  We talked about strategies for self-care.  Check back as needed.   PDMP not reviewed this encounter. Orders Placed This Encounter  Procedures   Ambulatory referral to Physical Therapy    Referral Priority:   Routine    Referral Type:   Physical Medicine    Referral Reason:   Specialty Services Required    Requested Specialty:   Physical Therapy    Number of Visits Requested:   1   No orders of the defined types were placed in this  encounter.    Discussed warning signs or symptoms. Please see discharge instructions. Patient expresses understanding.   The above documentation has been reviewed and is accurate and complete Artist Lloyd, M.D.

## 2024-02-14 ENCOUNTER — Ambulatory Visit (INDEPENDENT_AMBULATORY_CARE_PROVIDER_SITE_OTHER): Admitting: Physical Therapy

## 2024-02-14 ENCOUNTER — Encounter: Payer: Self-pay | Admitting: Physical Therapy

## 2024-02-14 DIAGNOSIS — R262 Difficulty in walking, not elsewhere classified: Secondary | ICD-10-CM

## 2024-02-14 DIAGNOSIS — M25551 Pain in right hip: Secondary | ICD-10-CM

## 2024-02-14 DIAGNOSIS — G8929 Other chronic pain: Secondary | ICD-10-CM | POA: Diagnosis not present

## 2024-02-14 NOTE — Therapy (Signed)
 OUTPATIENT PHYSICAL THERAPY LOWER EXTREMITY NOTE   Patient Name: Shirley Fleming MRN: [id number] DOB:April 21, 1976, 48 y.o., female Today's Date: 02/14/2024  END OF SESSION:  PT End of Session - 02/14/24 0854     Visit Number 8    Number of Visits 12    Date for Recertification  02/29/24    Authorization Type BCBS    PT Start Time [phone]    PT Stop Time 0930    PT Time Calculation (min) 43 min    Activity Tolerance Patient tolerated treatment well    Behavior During Therapy WFL for tasks assessed/performed              Past Medical History:  Diagnosis Date   Allergy 1993   Seasonal; mold, cats, trees, dust   Anemia    takes iron- heavy menstral cycle   Anxiety    on meds   Asthma    uses inhaler with exercise; Follows w/ PCP.   Depression 1995   Off and on since teens   Empty sella    Partially empty sella per MR brain 05/07/22, Follows w/ neurololgy.   GERD (gastroesophageal reflux disease)    OTC PRN meds   History of COVID-19 12/23/2020   Hypertension Jan 2021   resolved per pt, no meds   Hypoglycemia    Inflamed seborrheic keratosis 2024   Follows w/ [facility].   Migraine with aura    Follows w/ Dr. Venetia Potters, neurologist. See 05/07/22 MRI of brain in Epic.   Palpitations    after having Covid, takes Propranolol  prn, saw cardiologist, Dr. Lynwood Schilling, 06/17/20 Long term monitor in Epic   Pneumonia    mother was a smoker, pt states growing up she had pneumonia and bronchitis several times   Seasonal allergies    Wears glasses    for driving only   Past Surgical History:  Procedure Laterality Date   ABDOMINAL HYSTERECTOMY  Nov 2024   APPENDECTOMY  2003   COLONOSCOPY  02/09/2023   1 - 4mm polyp   CYSTOSCOPY N/A 03/09/2023   Procedure: CYSTOSCOPY;  Surgeon: Glennon Almarie POUR, MD;  Location: [facility];  Service: Gynecology;  Laterality: N/A;   ROBOTIC ASSISTED LAPAROSCOPIC HYSTERECTOMY AND SALPINGECTOMY Bilateral  03/09/2023   Procedure: XI ROBOTIC ASSISTED LAPAROSCOPIC HYSTERECTOMY AND SALPINGECTOMY;  Surgeon: Glennon Almarie POUR, MD;  Location: [facility];  Service: Gynecology;  Laterality: Bilateral;   TONSILLECTOMY AND ADENOIDECTOMY  1993   Patient Active Problem List   Diagnosis Date Noted   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   SUBJECTIVE:   SUBJECTIVE STATEMENT: Hip is a little tweaked, was on her feet walking yesterday.  Did 40 min on the bike last night.  Shoulder is better.  Saw Dr. Joane and he wrote a Rx for shoulder if needed.    Patient is familiar to me as I have treated her children in the  recent past.  Shirley Fleming has had therapy about 2 years ago and had to stop due to family issues.  She reports pain off and on since then but she cites that since her hysterectomy in November the pain has gotten a bit worse.  She feels like since she was not as active and then started to do more the pain has been increased in her right hip.  Recently on a trip to [place] she walked and did stairs excessively and she had a severe flareup. I constantly feel like its shifting out of place. I thought she was not able to walk, get home, etc.  The pain was excruciating at that time She notices pain mostly in standing and walking she tries to keep her weight symmetrical but when she does not things get out  of alignment including her knee. The pain radiates down my back of thigh and the groin, stops at the knee.  The whole leg feels like it is not aligned properly.  She started wearing a compressive knee brace and that does seem to help.  She knows that she is generally a hypermobile person or at least she was when she was in her teens and 10s she feel other pain in neck, upper back and knees.  She used to be an avid exerciser doing [employer] and running and is disappointed that she is not able to do anything currently without pain.   PERTINENT HISTORY: History of familial Ehlers-Danlos syndrome  Foot pain, hysterectomy ,migraines depression  PAIN:  Are you having pain? Yes: NPRS scale: 4/10 Pain location: Rt shoulder Pain description: tight Aggravating factors: using her arm  Relieving factors: Aleve     Yes: NPRS scale: 1/10 hip Rt hip getting better  PRECAUTIONS: None  RED FLAGS: None   WEIGHT BEARING RESTRICTIONS: No  FALLS:  Has patient fallen in last 6 months? 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   NEXT MD VISIT: A few weeks  OBJECTIVE:  Note: Objective measures were completed at Evaluation unless otherwise noted.  DIAGNOSTIC FINDINGS:  Normal XR recently   PATIENT SURVEYS:  LEFS  Extreme difficulty/unable (0), Quite a bit of difficulty (1), Moderate difficulty (2), Little difficulty (3), No difficulty (4)    COGNITION: Overall cognitive status: Within functional limits for tasks assessed     SENSATION: Tingling Rt > LT. LE knee to feet    EDEMA:    MUSCLE LENGTH: WFL hamstrings and ant hip   POSTURE: knees hyperextended , mild swayback  PALPATION: Pain with palpation to the right anterolateral hip extending into soreness along the gluteus medius and minimus.  Min TTP  noted at the greater trochanter  LOWER EXTREMITY ROM: WNL   Passive ROM Right eval Left eval  Hip flexion    Hip extension    Hip abduction    Hip adduction    Hip internal rotation 30 Pain  20 no pain   Hip external rotation 50 45  Knee flexion    Knee extension    Ankle dorsiflexion  Ankle plantarflexion    Ankle inversion    Ankle eversion     (Blank rows = not tested)  LOWER EXTREMITY MMT:  MMT Right eval Left eval  Hip flexion 5 5  Hip extension 5 5  Hip abduction 4+/5 pain  5  Hip adduction 4+ pain  5  Hip internal rotation    Hip external rotation    Knee flexion 5 5  Knee extension 5 5  Ankle dorsiflexion 5 5  Ankle plantarflexion    Ankle inversion    Ankle eversion     (Blank rows = not tested)  LOWER EXTREMITY SPECIAL TESTS:  Hip special tests: Belvie (FABER) test: positive , Trendelenburg test: negative, Hip scouring test: positive , and Anterior hip impingement test: positive   FUNCTIONAL TESTS:  5 times sit to stand: 12 SLS WNL bilateral  Squat Rt hip pinching good form                                                                                                                                TREATMENT DATE:   [facility] Adult PT Treatment:                                                DATE: 02/14/24 Therapeutic Exercise: Recumbent bike L1 for 5 min  Reformer:  Sidelying single leg footwork 2 [place] Adduction  Sidelying shoulder scaption 1 blue, forward flexion and triceps  Supine arms 1 Red 1 yellow arcs added alternating LE extension  Feet in straps 1 Red 1 blue arcs, circles and squats   [facility] Adult PT Treatment:                                                DATE: 02/11/24  Therapeutic  Exercise: PROM Rt UE Isometrics x 5 ER, IR flex, ext Row, extension Green  ER/IR red band standing  Flexion with dowel to 90 deg   Manual: KT tape x 2 Y along Rt deltoid and then horizontal   Therapeutic Activity: Pilates Tower for LE/Core strength, postural strength, lumbopelvic disassociation and core control.  Exercises included: Supine Leg Springs: single leg yellow arcs and circles  Double leg arcs and squats    [facility] Adult PT Treatment:                                                DATE: 01/1624 Therapeutic Activity/Exercise: Knee to chest  LTR with feet wide Bridging with blue band  x 10  Bridge with clam blue  band x 10  Bridge with march  Clam, hip abduction and side plank 30 sec x 2  Neuromuscular re-ed: Feet in Straps 1 red 1 blue Arcs in V, I and toes in x 10 each  Frog squats 1 red 1 blue   Supine arms 1 red 1 yellow Arcs, triceps extended knees as well for more abdominals    PATIENT EDUCATION:  Education details: See above education method: Explanation, Demonstration, and Handouts Education comprehension: verbalized understanding, returned demonstration, and needs further education  HOME EXERCISE PROGRAM: Access Code: [email or web address] URL: [email or web address] Date: 01/18/2024 Prepared by: Delon Norma   ASSESSMENT:  CLINICAL IMPRESSION: Rt shoulder pain improved since last time.  Standing increases hip pain more than walking.  She has begun to take community Pilates classes and noticing good results from that.    Patient is a 48 y.o. female who was seen today for physical therapy evaluation and treatment for hip pain.  She presents with signs and symptoms consistent with FAI/impingement  OBJECTIVE IMPAIRMENTS: decreased mobility, difficulty walking, decreased ROM, decreased strength, increased fascial restrictions, impaired flexibility, pain, and ?  Hypermobility.   ACTIVITY LIMITATIONS: carrying, lifting, bending, sitting, standing, squatting,  sleeping, transfers, locomotion level, and caring for others  PARTICIPATION LIMITATIONS: cleaning, interpersonal relationship, driving, shopping, community activity, and occupation  PERSONAL FACTORS: Past/current experiences, Time since onset of injury/illness/exacerbation, and 1-2 comorbidities: history of hypermobility, polyarthralgia are also affecting patient's functional outcome.   REHAB POTENTIAL: Excellent  CLINICAL DECISION MAKING: Evolving/moderate complexity  EVALUATION COMPLEXITY: Moderate   GOALS: Goals reviewed with patient? Yes  SHORT TERM GOALS: Target date: 02/15/2024   Patient will be able to show independence for initial HEP to include posture, core and hip strength and stability.   Baseline: Goal status: MET   2.  Patient will be I with concepts of joint protection and stability as it pertains to joint hypermobility. Baseline:  Goal status: ongoing   3.  Patient will report no hip pain with basic squat, sit to stand transfers Baseline:  Goal status: ongoing   4.  Patient will be able to walk 20 minutes with minimal increase in pain in the right hip Baseline:  Goal status: ongoing, has not done much   LONG TERM GOALS: Target date: 03/14/2024    Patient will improve LEFS score by 9 points or more Baseline: 39/80 Goal status: INITIAL  2.  Patient be able to walk 30 minutes for exercise without increased right hip pain 75% of the time Baseline:  Goal status: INITIAL  3.  Patient will be independent with home exercise program upon discharge Baseline:  Goal status: INITIAL  4.  Patient will be able to carry light items up and down the stairs without increased hip pain  Baseline:  Goal status: INITIAL  5.  Further goals to be assessed if necessary Baseline:  Goal status: INITIAL   PLAN:  PT FREQUENCY: 1-2x/week  PT DURATION: 6 weeks  PLANNED INTERVENTIONS: 97164- PT Re-evaluation, 97750- Physical Performance Testing, 97110-Therapeutic  exercises, 97530- Therapeutic activity, 97112- Neuromuscular re-education, 97535- Self Care, [id number]- Manual therapy, Patient/Family education, Balance training, Taping, Joint mobilization, Cryotherapy, and Moist heat  PLAN FOR NEXT SESSION: progress HEP progress core stability, improve hip mobility consider using the Pilates reformer or tower for max benefit, functional goal   Rodney Yera, PT 02/14/2024, 8:55 AM   Delon Norma, PT 02/14/24 8:55 AM Phone: [phone] Fax: [phone]

## 2024-02-19 ENCOUNTER — Encounter: Admitting: Physical Therapy

## 2024-02-20 DIAGNOSIS — F419 Anxiety disorder, unspecified: Secondary | ICD-10-CM | POA: Diagnosis not present

## 2024-02-20 DIAGNOSIS — F331 Major depressive disorder, recurrent, moderate: Secondary | ICD-10-CM | POA: Diagnosis not present

## 2024-02-20 NOTE — Therapy (Unsigned)
 OUTPATIENT PHYSICAL THERAPY LOWER EXTREMITY NOTE   Patient Name: Shirley Fleming MRN: [id number] DOB:November 14, 1975, 48 y.o., female Today's Date: 02/21/2024  END OF SESSION:  PT End of Session - 02/21/24 0850     Visit Number 9    Number of Visits 12    Date for Recertification  02/29/24    Authorization Type BC[employer]    PT Start Time 0848    PT Stop Time 0930    PT Time Calculation (min) 42 min    Activity Tolerance Patient tolerated treatment well    Behavior During Therapy WFL for tasks assessed/performed               Past Medical History:  Diagnosis Date   Allergy 1993   Seasonal; mold, cats, trees, dust   Anemia    takes iron- heavy menstral cycle   Anxiety    on meds   Asthma    uses inhaler with exercise; Follows w/ PCP.   Depression 1995   Off and on since teens   Empty sella    Partially empty sella per MR brain 05/07/22, Follows w/ neurololgy.   GERD (gastroesophageal reflux disease)    OTC PRN meds   History of COVID-19 12/23/2020   Hypertension Jan 2021   resolved per pt, no meds   Hypoglycemia    Inflamed seborrheic keratosis 2024   Follows w/ [facility].   Migraine with aura    Follows w/ Dr. Venetia Potters, neurologist. See 05/07/22 MRI of brain in Epic.   Palpitations    after having Covid, takes Propranolol  prn, saw cardiologist, Dr. Lynwood Schilling, 06/17/20 Long term monitor in Epic   Pneumonia    mother was a smoker, pt states growing up she had pneumonia and bronchitis several times   Seasonal allergies    Wears glasses    for driving only   Past Surgical History:  Procedure Laterality Date   ABDOMINAL HYSTERECTOMY  Nov 2024   APPENDECTOMY  2003   COLONOSCOPY  02/09/2023   1 - 4mm polyp   CYSTOSCOPY N/A 03/09/2023   Procedure: CYSTOSCOPY;  Surgeon: Glennon Almarie POUR, MD;  Location: [facility];  Service: Gynecology;  Laterality: N/A;   ROBOTIC ASSISTED LAPAROSCOPIC HYSTERECTOMY AND SALPINGECTOMY Bilateral  03/09/2023   Procedure: XI ROBOTIC ASSISTED LAPAROSCOPIC HYSTERECTOMY AND SALPINGECTOMY;  Surgeon: Glennon Almarie POUR, MD;  Location: [facility];  Service: Gynecology;  Laterality: Bilateral;   TONSILLECTOMY AND ADENOIDECTOMY  1993   Patient Active Problem List   Diagnosis Date Noted   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   SUBJECTIVE:   SUBJECTIVE STATEMENT: Pain Rt shoulder 6/10.  Was 9/10 this AM.  Tape helped it (and her wrists).    Patient is familiar to me as I have treated her children in the recent past.  Colinda has had therapy about 2 years ago and had to stop due to family issues.  She reports pain off and on since then but she cites that since her hysterectomy in November the pain has gotten a bit worse.  She feels like since she was not as active and then started to do more the pain has been increased in her right hip.  Recently on a trip to [place] she walked and did stairs excessively and she had a severe flareup. I constantly feel like its shifting out of place. I thought she was not able to walk, get home, etc.  The pain was excruciating at that time She notices pain mostly in standing and walking she tries to keep her weight symmetrical but when she does not things get out of alignment including her knee. The pain radiates down my back of thigh and the groin, stops at  the knee.  The whole leg feels like it is not aligned properly.  She started wearing a compressive knee brace and that does seem to help.  She knows that she is generally a hypermobile person or at least she was when she was in her teens and 41s she feel other pain in neck, upper back and knees.  She used to be an avid exerciser doing [employer] and running and is disappointed that she is not able to do anything currently without pain.   PERTINENT HISTORY: History of familial Ehlers-Danlos syndrome  Foot pain, hysterectomy ,migraines depression  PAIN:  Are you having pain? Yes: NPRS scale: 6/10 Pain location: Rt shoulder Pain description: tight Aggravating factors: using her arm  Relieving factors: Aleve     Yes: NPRS scale: 1/10 hip Rt hip getting better  PRECAUTIONS: None  RED FLAGS: None   WEIGHT BEARING RESTRICTIONS: No  FALLS:  Has patient fallen in last 6 months? 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   NEXT MD VISIT: A few weeks  OBJECTIVE:  Note: Objective measures were completed at Evaluation unless otherwise noted.  DIAGNOSTIC FINDINGS:  Normal XR recently   PATIENT SURVEYS:  LEFS  Extreme difficulty/unable (0), Quite a bit of difficulty (1), Moderate difficulty (2), Little difficulty (3), No difficulty (4)    COGNITION: Overall cognitive status: Within functional limits for tasks assessed     SENSATION: Tingling Rt > LT. LE knee to feet   EDEMA:    MUSCLE LENGTH: WFL hamstrings and ant hip   POSTURE: knees hyperextended , mild  swayback  PALPATION: Pain with palpation to the right anterolateral hip extending into soreness along the gluteus medius and minimus.  Min TTP  noted at the greater trochanter  LOWER EXTREMITY ROM: WNL   Passive ROM Right eval Left eval  Hip flexion    Hip extension    Hip abduction    Hip adduction    Hip internal rotation 30 Pain  20 no pain   Hip external rotation 50 45  Knee flexion    Knee extension    Ankle dorsiflexion    Ankle plantarflexion    Ankle inversion    Ankle eversion     (Blank rows =  not tested)  LOWER EXTREMITY MMT:  MMT Right eval Left eval  Hip flexion 5 5  Hip extension 5 5  Hip abduction 4+/5 pain  5  Hip adduction 4+ pain  5  Hip internal rotation    Hip external rotation    Knee flexion 5 5  Knee extension 5 5  Ankle dorsiflexion 5 5  Ankle plantarflexion    Ankle inversion    Ankle eversion     (Blank rows = not tested)  LOWER EXTREMITY SPECIAL TESTS:  Hip special tests: Belvie (FABER) test: positive , Trendelenburg test: negative, Hip scouring test: positive , and Anterior hip impingement test: positive   FUNCTIONAL TESTS:  5 times sit to stand: 12 SLS WNL bilateral  Squat Rt hip pinching good form                                                                                                                                TREATMENT DATE:    [facility] Adult PT Treatment:                                                DATE: 02/21/24 Manual Therapy: KT tape 1 I, 2 Y Rt UE  Therapeutic Activity: Isometric ER, IR  Sleeper stretch  Sidelying Tower Exercises Leg springs sidekicks and circles  Double leg arcs circles and squats    [facility] Adult PT Treatment:                                                DATE: 02/14/24 Therapeutic Exercise: Recumbent bike L1 for 5 min  Reformer:  Sidelying single leg footwork 2 Red springs Adduction  Sidelying shoulder scaption 1 blue, forward flexion and triceps  Supine arms 1 Red 1 yellow  arcs added alternating LE extension  Feet in straps 1 Red 1 blue arcs, circles and squats   [facility] Adult PT Treatment:                                                DATE: 02/11/24  Therapeutic Exercise: PROM Rt UE Isometrics x 5 ER, IR flex, ext Row, extension Green  ER/IR red band standing  Flexion with dowel to 90 deg   Manual: KT tape x 2 Y along Rt deltoid and then horizontal   Therapeutic Activity: Pilates Tower for LE/Core strength, postural strength, lumbopelvic disassociation and core control.  Exercises included: Supine Leg Springs: single leg yellow arcs and circles  Double leg arcs and squats    [facility] Adult  PT Treatment:                                                DATE: 01/1624 Therapeutic Activity/Exercise: Knee to chest  LTR with feet wide Bridging with blue band  x 10  Bridge with clam blue band x 10  Bridge with march  Clam, hip abduction and side plank 30 sec x 2  Neuromuscular re-ed: Feet in Straps 1 red 1 blue Arcs in V, I and toes in x 10 each  Frog squats 1 red 1 blue   Supine arms 1 red 1 yellow Arcs, triceps extended knees as well for more abdominals    PATIENT EDUCATION:  Education details: See above education method: Explanation, Demonstration, and Handouts Education comprehension: verbalized understanding, returned demonstration, and needs further education  HOME EXERCISE PROGRAM: Access Code: [email or web address] URL: [email or web address] Date: 02/21/2024 Prepared by: Delon Norma  Exercises - Supine Bilateral Hip Internal Rotation Stretch  - 1 x daily - 7 x weekly - 2 sets - 10 reps - 5-10 hold - Sidelying Hip Circles  - 1 x daily - 7 x weekly - 2 sets - 10 reps - Supine Bridge with Resistance Band  - 1 x daily - 7 x weekly - 2 sets - 10 reps - 5 hold - Standing Hip Flexor Stretch  - 1 x daily - 7 x weekly - 1 sets - 3 reps - 15-20 hold - Single Straight Leg Hip Hinge With Dowel  - 1 x daily - 7 x weekly - 2 sets - 10 reps - 5 hold -  Forward T with Counter Support  - 1 x daily - 7 x weekly - 2 sets - 10 reps - Standing Isometric Shoulder External Rotation with Doorway and Towel Roll  - 1 x daily - 7 x weekly - 2 sets - 10 reps - 5 hold - Standing Isometric Shoulder Extension with Doorway - Arm Bent  - 1 x daily - 7 x weekly - 2 sets - 10 reps - 5 hold  ASSESSMENT:  CLINICAL IMPRESSION: Patient with an increase in right shoulder pain for the past 3 days.  When she performs a shoulder blade squeeze(retraction) she feels pain from the front to the back of her scapula.  Focused on light isometrics and provided KT tape for support.  Will continue to provide shoulder stabilization in addition to lumbopelvic.  Patient is a 48 y.o. female who was seen today for physical therapy evaluation and treatment for hip pain.  She presents with signs and symptoms consistent with FAI/impingement  OBJECTIVE IMPAIRMENTS: decreased mobility, difficulty walking, decreased ROM, decreased strength, increased fascial restrictions, impaired flexibility, pain, and ?  Hypermobility.   ACTIVITY LIMITATIONS: carrying, lifting, bending, sitting, standing, squatting, sleeping, transfers, locomotion level, and caring for others  PARTICIPATION LIMITATIONS: cleaning, interpersonal relationship, driving, shopping, community activity, and occupation  PERSONAL FACTORS: Past/current experiences, Time since onset of injury/illness/exacerbation, and 1-2 comorbidities: history of hypermobility, polyarthralgia are also affecting patient's functional outcome.   REHAB POTENTIAL: Excellent  CLINICAL DECISION MAKING: Evolving/moderate complexity  EVALUATION COMPLEXITY: Moderate   GOALS: Goals reviewed with patient? Yes  SHORT TERM GOALS: Target date: 02/15/2024   Patient will be able to show independence for initial HEP to include posture, core and hip strength and stability.   Baseline: Goal status: MET   2.  Patient  will be I with concepts of joint  protection and stability as it pertains to joint hypermobility. Baseline:  Goal status: ongoing   3.  Patient will report no hip pain with basic squat, sit to stand transfers Baseline:  Goal status: ongoing   4.  Patient will be able to walk 20 minutes with minimal increase in pain in the right hip Baseline:  Goal status: ongoing, has not done much   LONG TERM GOALS: Target date: 03/14/2024    Patient will improve LEFS score by 9 points or more Baseline: 39/80 Goal status: INITIAL  2.  Patient be able to walk 30 minutes for exercise without increased right hip pain 75% of the time Baseline:  Goal status: INITIAL  3.  Patient will be independent with home exercise program upon discharge Baseline:  Goal status: INITIAL  4.  Patient will be able to carry light items up and down the stairs without increased hip pain  Baseline:  Goal status: INITIAL  5.  Further goals to be assessed if necessary Baseline:  Goal status: INITIAL   PLAN:  PT FREQUENCY: 1-2x/week  PT DURATION: 6 weeks  PLANNED INTERVENTIONS: 97164- PT Re-evaluation, 97750- Physical Performance Testing, 97110-Therapeutic exercises, 97530- Therapeutic activity, 97112- Neuromuscular re-education, 97535- Self Care, [id number]- Manual therapy, Patient/Family education, Balance training, Taping, Joint mobilization, Cryotherapy, and Moist heat  PLAN FOR NEXT SESSION: progress HEP progress core stability, improve hip mobility consider using the Pilates reformer or tower for max benefit, functional goal   Sorin Frimpong, PT 02/21/2024, 9:34 AM   Delon Norma, PT 02/21/24 9:34 AM Phone: [phone] Fax: [phone]

## 2024-02-21 ENCOUNTER — Encounter: Payer: Self-pay | Admitting: Physical Therapy

## 2024-02-21 ENCOUNTER — Ambulatory Visit (INDEPENDENT_AMBULATORY_CARE_PROVIDER_SITE_OTHER): Admitting: Physical Therapy

## 2024-02-21 DIAGNOSIS — G8929 Other chronic pain: Secondary | ICD-10-CM

## 2024-02-21 DIAGNOSIS — M25551 Pain in right hip: Secondary | ICD-10-CM | POA: Diagnosis not present

## 2024-02-21 DIAGNOSIS — R262 Difficulty in walking, not elsewhere classified: Secondary | ICD-10-CM | POA: Diagnosis not present

## 2024-02-27 ENCOUNTER — Encounter: Payer: Self-pay | Admitting: Physical Therapy

## 2024-02-27 ENCOUNTER — Ambulatory Visit: Admitting: Physical Therapy

## 2024-02-27 DIAGNOSIS — M357 Hypermobility syndrome: Secondary | ICD-10-CM | POA: Diagnosis not present

## 2024-02-27 DIAGNOSIS — M25511 Pain in right shoulder: Secondary | ICD-10-CM | POA: Diagnosis not present

## 2024-02-27 DIAGNOSIS — R262 Difficulty in walking, not elsewhere classified: Secondary | ICD-10-CM

## 2024-02-27 DIAGNOSIS — M25551 Pain in right hip: Secondary | ICD-10-CM | POA: Diagnosis not present

## 2024-02-27 DIAGNOSIS — G8929 Other chronic pain: Secondary | ICD-10-CM

## 2024-02-27 NOTE — Therapy (Signed)
 OUTPATIENT PHYSICAL THERAPY RENEWAL   Patient Name: Shirley Fleming MRN: [id number] DOB:[birth year], 48 y.o., female Today's Date: 02/27/2024  END OF SESSION:  PT End of Session - 02/27/24 0939     Visit Number 10    Number of Visits 12    Date for Recertification  02/29/24    Authorization Type BC[employer]    PT Start Time 0935    PT Stop Time 1015    PT Time Calculation (min) 40 min    Activity Tolerance Patient tolerated treatment well    Behavior During Therapy WFL for tasks assessed/performed                Past Medical History:  Diagnosis Date   Allergy 1993   Seasonal; mold, cats, trees, dust   Anemia    takes iron- heavy menstral cycle   Anxiety    on meds   Asthma    uses inhaler with exercise; Follows w/ PCP.   Depression 1995   Off and on since teens   Empty sella    Partially empty sella per MR brain 05/07/22, Follows w/ neurololgy.   GERD (gastroesophageal reflux disease)    OTC PRN meds   History of COVID-19 12/23/2020   Hypertension Jan 2021   resolved per pt, no meds   Hypoglycemia    Inflamed seborrheic keratosis 2024   Follows w/ [facility].   Migraine with aura    Follows w/ Dr. Venetia Potters, neurologist. See 05/07/22 MRI of brain in Epic.   Palpitations    after having Covid, takes Propranolol  prn, saw cardiologist, Dr. Lynwood Schilling, 06/17/20 Long term monitor in Epic   Pneumonia    mother was a smoker, pt states growing up she had pneumonia and bronchitis several times   Seasonal allergies    Wears glasses    for driving only   Past Surgical History:  Procedure Laterality Date   ABDOMINAL HYSTERECTOMY  Nov 2024   APPENDECTOMY  2003   COLONOSCOPY  02/09/2023   1 - 4mm polyp   CYSTOSCOPY N/A 03/09/2023   Procedure: CYSTOSCOPY;  Surgeon: Glennon Almarie POUR, MD;  Location[facility];  Service: Gynecology;  Laterality: N/A;   ROBOTIC ASSISTED LAPAROSCOPIC HYSTERECTOMY AND SALPINGECTOMY Bilateral 03/09/2023    Procedure: XI ROBOTIC ASSISTED LAPAROSCOPIC HYSTERECTOMY AND SALPINGECTOMY;  Surgeon: Glennon Almarie POUR, MD;  Location: [facility];  Service: Gynecology;  Laterality: Bilateral;   TONSILLECTOMY AND ADENOIDECTOMY  1993   Patient Active Problem List   Diagnosis Date Noted   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   Multiple family members with Ehlers-Danlos syndrome 12/14/2023   At high risk for breast cancer 10/31/2021   Genetic testing 09/22/2021   Vitamin D deficiency 05/24/2020   Primary hypertension 05/24/2020   Migraine with aura and without status migrainosus, not intractable 05/24/2020   Anxiety 05/24/2020    PCP: Job Lukes PA   REFERRING PROVIDER: Joane Birmingham MD   REFERRING DIAG: [phone] (ICD-10-CM) - Chronic right hip pain  THERAPY DIAG:  Hypermobility syndrome  Chronic right hip pain  Difficulty walking  Chronic right shoulder pain  Rationale for Evaluation and Treatment: Rehabilitation  ONSET DATE: acute on chronic?   SUBJECTIVE:   SUBJECTIVE STATEMENT: Patient is currently being seen for right hip pain with hypermobile syndrome.   Overall her hip is quite a bit better, but noticed a couple weeks ago an increase in right shoulder pain.  Currently the pain is a bit better at 4/10 Rt  UE.  Aggravating factors include sleeping, reach back/out, lifting her purse.   We have been treating the shoulder a bit with light isometrics gentle range of motion and KT tape. the tape she has at home irritated her skin (her own) .  She would like to continue therapy for her hip and her shoulder as well.    Patient is familiar to me as I have treated her children in the recent past.  [name] has had therapy about 2 years ago and had to stop due to family issues.  She reports pain off and on since then but she cites that since her hysterectomy in November the pain has gotten a bit worse.  She feels like since she was not as active and then started to do more  the pain has been increased in her right hip.  Recently on a trip to [place] she walked and did stairs excessively and she had a severe flareup. I constantly feel like its shifting out of place. I thought she was not able to walk, get home, etc.  The pain was excruciating at that time She notices pain mostly in standing and walking she tries to keep her weight symmetrical but when she does not things get out of alignment including her knee. The pain radiates down my back of thigh and the groin, stops at the knee.  The whole leg feels like it is not aligned properly.  She started wearing a compressive knee brace and that does seem to help.  She knows that she is generally a hypermobile person or at least she was when she was in her teens and 69s she feel other pain in neck, upper back and knees.  She used to be an avid exerciser doing [employer] and running and is disappointed that she is not able to do anything currently without pain.   PERTINENT HISTORY: History of familial Ehlers-Danlos syndrome  Foot pain, hysterectomy ,migraines depression  PAIN:  Are you having pain? Yes: NPRS scale: 4/10 Pain location: Rt shoulder Pain description: tight Aggravating factors: using her arm  Relieving factors: Aleve     Yes: NPRS scale: 8/10 last night, current 4/10 Pain location: Rt hip Pain description: tight, sore  Aggravating factors: walking, standing  Relieving factors: Aleve , exercise    PRECAUTIONS: None  RED FLAGS: None   WEIGHT BEARING RESTRICTIONS: No  FALLS:  Has patient fallen in last 6 months? 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   NEXT MD VISIT: A few weeks  OBJECTIVE:  Note: Objective measures were completed at Evaluation unless otherwise noted.  DIAGNOSTIC FINDINGS:  Normal XR recently   PATIENT SURVEYS:  LEFS  Extreme difficulty/unable (0), Quite a bit of difficulty (1), Moderate difficulty (2), Little difficulty (3), No difficulty (4)    COGNITION: Overall cognitive status: Within functional limits for tasks assessed     SENSATION: Tingling Rt > LT. LE knee to feet   EDEMA:    MUSCLE LENGTH: WFL hamstrings and ant hip   POSTURE: knees hyperextended , mild swayback  PALPATION: Pain with palpation to the right anterolateral hip extending into soreness along the gluteus medius and minimus.  Min TTP  noted at the greater trochanter  LOWER EXTREMITY ROM: WNL    (Blank rows = not tested)  LOWER EXTREMITY MMT:  MMT Right eval Left eval  Hip flexion 5 5  Hip extension 5 5  Hip abduction 4+/5 pain  5  Hip adduction 4+ pain  5  Hip internal rotation    Hip external rotation    Knee flexion 5 5  Knee extension 5 5  Ankle dorsiflexion 5 5  Ankle plantarflexion    Ankle inversion    Ankle eversion     (Blank rows = not tested)  LOWER EXTREMITY SPECIAL TESTS:  Hip special tests: Belvie (FABER) test: positive , Trendelenburg test: negative, Hip scouring test: positive , and Anterior hip impingement test: positive   FUNCTIONAL TESTS:  5 times sit to stand: 12 SLS WNL bilateral  Squat Rt hip pinching good form   SENSATION: Occ numbness, tingling   POSTURE: Rt shoulder more anterior   UPPER EXTREMITY ROM:   Active ROM Right 02/27/2024 Left 02/27/2024  Shoulder flexion Full, with Pinch at end range  WNL   Shoulder extension    Shoulder abduction    Shoulder adduction    Shoulder internal rotation FR to Mid back WNL Mid back  WNL   Shoulder external rotation Mid back, end range pain   Equal , WNL no  pain     UPPER EXTREMITY MMT:  MMT Right 02/27/2024 Left 02/27/2024  Shoulder flexion 5 pain  5  Shoulder extension    Shoulder abduction 4 inc pain  5  Shoulder adduction    Shoulder internal rotation 5 5  Shoulder external rotation 4+ 4+  Middle trapezius    Lower trapezius    Elbow flexion 5 5  Elbow extension 5 5   SHOULDER SPECIAL TESTS:  Impingement tests: Painful arc test: positive    JOINT MOBILITY TESTING:  Hypermobile Rt GH joint  PALPATION:  Pain to anterior shoulder, humeral head press posterior                                                                                                                      TREATMENT DATE:   [facility] Adult PT Treatment:                                                DATE: 02/27/24 Therapeutic Exercise: External rotation GTB Horizontal abd GTB  OH lift with band and then dowel (reduced pain with dowel, wide) Row, extensiuon blue band Sidefacing ER and IR with walk out Reassessed hip PROM Rt shoulder mobility/PROM  Goal check HEP   PATIENT EDUCATION:  Education details: See above education method: Explanation, Demonstration, and Handouts Education comprehension: verbalized understanding, returned demonstration, and needs further  education  HOME EXERCISE PROGRAM: Access Code: [email or web address] URL: [email or web address] Date: 02/21/2024 Prepared by: Delon Norma  Exercises - Supine Bilateral Hip Internal Rotation Stretch  - 1 x daily - 7 x weekly - 2 sets - 10 reps - 5-10 hold - Sidelying Hip Circles  - 1 x daily - 7 x weekly - 2 sets - 10 reps - Supine Bridge with Resistance Band  - 1 x daily - 7 x weekly - 2 sets - 10 reps - 5 hold - Standing Hip Flexor Stretch  - 1 x daily - 7 x weekly - 1 sets - 3 reps - 15-20 hold - Single Straight Leg Hip Hinge With Dowel  - 1 x daily - 7 x weekly - 2 sets - 10 reps - 5 hold - Forward T with Counter Support  - 1 x daily - 7 x weekly - 2 sets - 10 reps - Standing Isometric  Shoulder External Rotation with Doorway and Towel Roll  - 1 x daily - 7 x weekly - 2 sets - 10 reps - 5 hold - Standing Isometric Shoulder Extension with Doorway - Arm Bent  - 1 x daily - 7 x weekly - 2 sets - 10 reps - 5 hold  ASSESSMENT:  CLINICAL IMPRESSION: Patient was Re-evaluated for continuing physical therapy for right hip pain and right shoulder pain in the setting of hypermobility syndrome.  Her hip pain overall is improving but does still give her moderate difficulty when standing for longer periods of time.  The shoulder mostly bothers her with reaching out to the side and back especially when she is seated in the drivers side of her car.  Her right shoulder is strong but painful.  Notable forward position of the glenohumeral head in the shoulder joint.  Patient has been using her recumbent bike 7-10 miles per day, doing more and still maintaining fairly good control of her hip pain. See below for goals met.  Shoulder presents with signs and symptoms of Impingement syndrome with underlying shoulder instability.  She will continue to benefit from skilled physical therapy to optimize movement patterns.   Patient is a 48 y.o. female who was seen today for physical therapy evaluation and treatment for hip pain.  She presents with signs and symptoms consistent with FAI/impingement  OBJECTIVE IMPAIRMENTS: decreased mobility, difficulty walking, decreased ROM, decreased strength, increased fascial restrictions, impaired flexibility, pain, and ?  Hypermobility.   ACTIVITY LIMITATIONS: carrying, lifting, bending, sitting, standing, squatting, sleeping, transfers, locomotion level, and caring for others  PARTICIPATION LIMITATIONS: cleaning, interpersonal relationship, driving, shopping, community activity, and occupation  PERSONAL FACTORS: Past/current experiences, Time since onset of injury/illness/exacerbation, and 1-2 comorbidities: history of hypermobility, polyarthralgia are also affecting  patient's functional outcome.   REHAB POTENTIAL: Excellent  CLINICAL DECISION MAKING: Evolving/moderate complexity  EVALUATION COMPLEXITY: Moderate   GOALS: Goals reviewed with patient? Yes  SHORT TERM GOALS: Target date: 02/15/2024   Patient will be able to show independence for initial HEP to include posture, core and hip strength and stability.   Baseline: Goal status: MET   2.  Patient will be I with concepts of joint protection and stability as it pertains to joint hypermobility. Baseline:  Goal status MET   3.  Patient will report no hip pain with basic squat, sit to stand transfers Baseline: update: pain varies depending on activity.  She can have min pain with squatting  Goal status: ongoing   4.  Patient will be able to walk 20  minutes with minimal increase in pain in the right hip Baseline:  Goal status: MET   LONG TERM GOALS: Target date: 03/14/2024    Patient will improve LEFS score by 9 points or more Baseline: 39/80 Goal status:ongoing   2.  Patient be able to walk 30 minutes for exercise without increased right hip pain 75% of the time Baseline:  Goal status: ongoing   3.  Patient will be independent with home exercise program upon discharge Baseline:  Goal status: ongoing   4.  Patient will be able to carry light items up and down the stairs without increased hip pain  Baseline:  Goal status: MET   5.  Patient will be able to complete her ADLs , grooming with no increase in shoulder pain for 2 weeks in a row  Baseline: Goal status: NEW    6. Patient will notice less hip discomfort when standing for home tasks    Baseline:     Goal status: NEW   PLAN:  PT FREQUENCY: 1-2x/week  PT DURATION: 6 weeks  PLANNED INTERVENTIONS: 97164- PT Re-evaluation, 97750- Physical Performance Testing, 97110-Therapeutic exercises, 97530- Therapeutic activity, 97112- Neuromuscular re-education, 97535- Self Care, [id number]- Manual therapy, Patient/Family education,  Balance training, Taping, Joint mobilization, Cryotherapy, and Moist heat  PLAN FOR NEXT SESSION: progress core stability, improve hip mobility consider using the Pilates reformer or tower for max benefit, functional goal   Gayle Martinez, PT 02/27/2024, 6:23 PM   Delon Norma, PT 02/27/24 6:23 PM Phone: [phone] Fax: [phone]

## 2024-03-03 DIAGNOSIS — F419 Anxiety disorder, unspecified: Secondary | ICD-10-CM | POA: Diagnosis not present

## 2024-03-03 DIAGNOSIS — F331 Major depressive disorder, recurrent, moderate: Secondary | ICD-10-CM | POA: Diagnosis not present

## 2024-03-06 ENCOUNTER — Encounter: Payer: Self-pay | Admitting: Physical Therapy

## 2024-03-06 ENCOUNTER — Ambulatory Visit: Admitting: Physical Therapy

## 2024-03-06 DIAGNOSIS — R262 Difficulty in walking, not elsewhere classified: Secondary | ICD-10-CM | POA: Diagnosis not present

## 2024-03-06 DIAGNOSIS — M25551 Pain in right hip: Secondary | ICD-10-CM | POA: Diagnosis not present

## 2024-03-06 DIAGNOSIS — M25511 Pain in right shoulder: Secondary | ICD-10-CM

## 2024-03-06 DIAGNOSIS — M357 Hypermobility syndrome: Secondary | ICD-10-CM

## 2024-03-06 DIAGNOSIS — G8929 Other chronic pain: Secondary | ICD-10-CM

## 2024-03-06 NOTE — Therapy (Signed)
 OUTPATIENT PHYSICAL THERAPY NOTE   Patient Name: Shirley Fleming MRN: [id number] DOB:[birth year], 48 y.o., female Today's Date: 03/06/2024  END OF SESSION:  PT End of Session - 03/06/24 1019     Visit Number 11    Number of Visits 18    Date for Recertification  04/23/24    Authorization Type BC[employer]    PT Start Time 1017    PT Stop Time 1100    PT Time Calculation (min) 43 min    Activity Tolerance Patient tolerated treatment well    Behavior During Therapy WFL for tasks assessed/performed            Past Medical History:  Diagnosis Date   Allergy 1993   Seasonal; mold, cats, trees, dust   Anemia    takes iron- heavy menstral cycle   Anxiety    on meds   Asthma    uses inhaler with exercise; Follows w/ PCP.   Depression 1995   Off and on since teens   Empty sella    Partially empty sella per MR brain 05/07/22, Follows w/ neurololgy.   GERD (gastroesophageal reflux disease)    OTC PRN meds   History of COVID-19 12/23/2020   Hypertension Jan 2021   resolved per pt, no meds   Hypoglycemia    Inflamed seborrheic keratosis 2024   Follows w/ [facility].   Migraine with aura    Follows w/ Dr. Venetia Potters, neurologist. See 05/07/22 MRI of brain in Epic.   Palpitations    after having Covid, takes Propranolol  prn, saw cardiologist, Dr. Lynwood Schilling, 06/17/20 Long term monitor in Epic   Pneumonia    mother was a smoker, pt states growing up she had pneumonia and bronchitis several times   Seasonal allergies    Wears glasses    for driving only   Past Surgical History:  Procedure Laterality Date   ABDOMINAL HYSTERECTOMY  Nov 2024   APPENDECTOMY  2003   COLONOSCOPY  02/09/2023   1 - 4mm polyp   CYSTOSCOPY N/A 03/09/2023   Procedure: CYSTOSCOPY;  Surgeon: Glennon Almarie POUR, MD;  Location: [facility];  Service: Gynecology;  Laterality: N/A;   ROBOTIC ASSISTED LAPAROSCOPIC HYSTERECTOMY AND SALPINGECTOMY Bilateral 03/09/2023   Procedure: XI  ROBOTIC ASSISTED LAPAROSCOPIC HYSTERECTOMY AND SALPINGECTOMY;  Surgeon: Glennon Almarie POUR, MD;  Location: [facility];  Service: Gynecology;  Laterality: Bilateral;   TONSILLECTOMY AND ADENOIDECTOMY  1993   Patient Active Problem List   Diagnosis Date Noted   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   Multiple family members with Ehlers-Danlos syndrome 12/14/2023   At high risk for breast cancer 10/31/2021   Genetic testing 09/22/2021   Vitamin D deficiency 05/24/2020   Primary hypertension 05/24/2020   Migraine with aura and without status migrainosus, not intractable 05/24/2020   Anxiety 05/24/2020    PCP: Job Lukes PA   REFERRING PROVIDER: Joane Birmingham MD   REFERRING DIAG: [phone] (ICD-10-CM) - Chronic right hip pain  THERAPY DIAG:  Chronic right hip pain  Difficulty walking  Chronic right shoulder pain  Hypermobility syndrome  Rationale for Evaluation and Treatment: Rehabilitation  ONSET DATE: acute on chronic?   SUBJECTIVE:   SUBJECTIVE STATEMENT: Hip has been decent.  Does the bike on the days Im not working.  I am so tired.  Standing and walking at work. Shoulder has been fine, I have been careful and aware of my posture.    Patient is familiar to me as I have  treated her children in the recent past.  Shirley Fleming has had therapy about 2 years ago and had to stop due to family issues.  She reports pain off and on since then but she cites that since her hysterectomy in November the pain has gotten a bit worse.  She feels like since she was not as active and then started to do more the pain has been increased in her right hip.  Recently on a trip to [place] she walked and did stairs excessively and she had a severe flareup. I constantly feel like its shifting out of place. I thought she was not able to walk, get home, etc.  The pain was excruciating at that time She notices pain mostly in standing and walking she tries to keep her weight symmetrical  but when she does not things get out of alignment including her knee. The pain radiates down my back of thigh and the groin, stops at the knee.  The whole leg feels like it is not aligned properly.  She started wearing a compressive knee brace and that does seem to help.  She knows that she is generally a hypermobile person or at least she was when she was in her teens and 26s she feel other pain in neck, upper back and knees.  She used to be an avid exerciser doing [employer] and running and is disappointed that she is not able to do anything currently without pain.   PERTINENT HISTORY: History of familial Ehlers-Danlos syndrome  Foot pain, hysterectomy ,migraines depression  PAIN:  Are you having pain? Yes: NPRS scale: 1/10 Pain location: Rt shoulder Pain description: tight Aggravating factors: using her arm  Relieving factors: Aleve     Yes: NPRS scale: 2/10  Pain location: Rt hip Pain description: tight, sore  Aggravating factors: walking, standing  Relieving factors: Aleve , exercise    PRECAUTIONS: None  RED FLAGS: None   WEIGHT BEARING RESTRICTIONS: No  FALLS:  Has patient fallen in last 6 months? 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   NEXT MD VISIT: A few weeks  OBJECTIVE:  Note: Objective measures were completed at Evaluation unless otherwise noted.  DIAGNOSTIC FINDINGS:  Normal XR recently   PATIENT SURVEYS:  LEFS  Extreme difficulty/unable (0), Quite a bit of difficulty (1), Moderate difficulty (2), Little difficulty (3), No  difficulty (4)    COGNITION: Overall cognitive status: Within functional limits for tasks assessed     SENSATION: Tingling Rt > LT. LE knee to feet   EDEMA:    MUSCLE LENGTH: WFL hamstrings and ant hip   POSTURE: knees hyperextended , mild swayback  PALPATION: Pain with palpation to the right anterolateral hip extending into soreness along the gluteus medius and minimus.  Min TTP  noted at the greater trochanter  LOWER EXTREMITY ROM: WNL    (Blank rows = not tested)  LOWER EXTREMITY MMT:  MMT Right eval Left eval  Hip flexion 5 5  Hip extension 5 5  Hip abduction 4+/5 pain  5  Hip adduction 4+  pain  5  Hip internal rotation    Hip external rotation    Knee flexion 5 5  Knee extension 5 5  Ankle dorsiflexion 5 5  Ankle plantarflexion    Ankle inversion    Ankle eversion     (Blank rows = not tested)  LOWER EXTREMITY SPECIAL TESTS:  Hip special tests: Belvie (FABER) test: positive , Trendelenburg test: negative, Hip scouring test: positive , and Anterior hip impingement test: positive   FUNCTIONAL TESTS:  5 times sit to stand: 12 SLS WNL bilateral  Squat Rt hip pinching good form   SENSATION: Occ numbness, tingling   POSTURE: Rt shoulder more anterior   UPPER EXTREMITY ROM:   Active ROM Right 03/06/2024 Left 03/06/2024  Shoulder flexion Full, with Pinch at end range  WNL   Shoulder extension    Shoulder abduction    Shoulder adduction    Shoulder internal rotation FR to Mid back WNL Mid back  WNL   Shoulder external rotation Mid back, end range pain   Equal , WNL no pain     UPPER EXTREMITY MMT:  MMT Right 03/06/2024 Left 03/06/2024  Shoulder flexion 5 pain  5  Shoulder extension    Shoulder abduction 4 inc pain  5  Shoulder adduction    Shoulder internal rotation 5 5  Shoulder external rotation 4+ 4+  Middle trapezius    Lower trapezius    Elbow flexion 5 5  Elbow extension 5 5   SHOULDER SPECIAL TESTS:  Impingement tests:  Painful arc test: positive    JOINT MOBILITY TESTING:  Hypermobile Rt GH joint  PALPATION:  Pain to anterior shoulder, humeral head press posterior                                                                                                                      TREATMENT DATE:    [facility] Adult PT Treatment:                                                DATE: 03/06/24 Neuromuscular re-ed: Pilates Reformer used for LE/core strength, postural strength, lumbopelvic disassociation and core control.  Exercises included: Footwork  Double leg Parallel heels, toes narrow and wide Pilates V heels and toes narrow and wide  2 red 1 green ball under pelvis  Single leg  Heel in parallel and turnout Double leg heel raise and calf stretch, prancing   SLR each LE x 10  Bridging x 10 with press out and pilates circle  Supine Arm work 1 red 1 yellow Arcs added chest lift x 6 with cues  Feet in Straps 1 red 1 blue arcs in parallel and turn out  Squat to wheel 1 red 1 blue  Seated ER, flexion and diagonal yellow  Tower: Chest press double arms  Seated bicep curls  [facility] Adult PT Treatment:                                                DATE: 02/27/24 Therapeutic Exercise: External rotation GTB Horizontal abd GTB  OH lift with band and then dowel (reduced pain with dowel, wide) Row, extension blue band Sidefacing ER and IR with walk out Reassessed hip PROM Rt shoulder mobility/PROM  Goal check HEP   PATIENT EDUCATION:  Education details: See above education method: Explanation, Demonstration, and Handouts Education comprehension: verbalized understanding, returned demonstration, and needs further education  HOME EXERCISE PROGRAM: Access Code: [email or web address] URL: [email or web address] Date: 02/21/2024 Prepared by: Delon Norma  Exercises - Supine Bilateral Hip Internal Rotation Stretch  - 1 x daily - 7 x weekly - 2 sets - 10 reps - 5-10 hold - Sidelying Hip Circles  - 1 x  daily - 7 x weekly - 2 sets - 10 reps - Supine Bridge with Resistance Band  - 1 x daily - 7 x weekly - 2 sets - 10 reps - 5 hold - Standing Hip Flexor Stretch  - 1 x daily - 7 x weekly - 1 sets - 3 reps - 15-20 hold - Single Straight Leg Hip Hinge With Dowel  - 1 x daily - 7 x weekly - 2 sets - 10 reps - 5 hold - Forward T with Counter Support  - 1 x daily - 7 x weekly - 2 sets - 10 reps - Standing Isometric Shoulder External Rotation with Doorway and Towel Roll  - 1 x daily - 7 x weekly - 2 sets - 10 reps - 5 hold - Standing Isometric Shoulder Extension with Doorway - Arm Bent  - 1 x daily - 7 x weekly - 2 sets - 10 reps - 5 hold  ASSESSMENT:  CLINICAL IMPRESSION:  Patient is noting improvement in both her shoulder and her hip.  She has been enjoying attending the weekend Pilates class and is more mindful of her daily activities when reaching out and back with her arm.  She has started to work a little bit as well and is up on her feet quite a bit.  Despite this her hip pain has been well-controlled. The Pilates environment provides the necessary feedback for providing proprioceptive input to joints and surrounding tissues for stability and control.  Cont POC.    Patient is a 48 y.o. female who was seen today for physical therapy evaluation and treatment for hip pain.  She presents with signs and symptoms consistent with FAI/impingement  OBJECTIVE IMPAIRMENTS: decreased mobility, difficulty walking, decreased ROM, decreased strength, increased fascial restrictions, impaired flexibility, pain, and ?  Hypermobility.   ACTIVITY LIMITATIONS: carrying, lifting, bending, sitting, standing, squatting, sleeping, transfers, locomotion level, and caring for others  PARTICIPATION LIMITATIONS: cleaning, interpersonal relationship, driving, shopping, community activity, and occupation  PERSONAL FACTORS: Past/current experiences, Time since onset of injury/illness/exacerbation, and 1-2 comorbidities:  history of hypermobility, polyarthralgia are also affecting patient's functional outcome.   REHAB POTENTIAL: Excellent  CLINICAL DECISION MAKING: Evolving/moderate complexity  EVALUATION COMPLEXITY: Moderate   GOALS: Goals reviewed with patient? Yes  SHORT TERM GOALS: Target date: 02/15/2024   Patient will be able to show independence for initial HEP to include posture, core and hip strength and stability.   Baseline: Goal status: MET   2.  Patient will be I  with concepts of joint protection and stability as it pertains to joint hypermobility. Baseline:  Goal status MET   3.  Patient will report no hip pain with basic squat, sit to stand transfers Baseline: update: pain varies depending on activity.  She can have min pain with squatting  Goal status: ongoing   4.  Patient will be able to walk 20 minutes with minimal increase in pain in the right hip Baseline:  Goal status: MET   LONG TERM GOALS: Target date: 03/14/2024    Patient will improve LEFS score by 9 points or more Baseline: 39/80 Goal status:ongoing   2.  Patient be able to walk 30 minutes for exercise without increased right hip pain 75% of the time Baseline:  Goal status: ongoing   3.  Patient will be independent with home exercise program upon discharge Baseline:  Goal status: ongoing   4.  Patient will be able to carry light items up and down the stairs without increased hip pain  Baseline:  Goal status: MET   5.  Patient will be able to complete her ADLs , grooming with no increase in shoulder pain for 2 weeks in a row  Baseline: Goal status: NEW    6. Patient will notice less hip discomfort when standing for home tasks    Baseline:     Goal status: NEW   PLAN:  PT FREQUENCY: 1-2x/week Because her PT DURATION: 6 weeks  PLANNED INTERVENTIONS: 97164- PT Re-evaluation, 97750- Physical Performance Testing, 97110-Therapeutic exercises, 97530- Therapeutic activity, 97112- Neuromuscular  re-education, 97535- Self Care, [id number]- Manual therapy, Patient/Family education, Balance training, Taping, Joint mobilization, Cryotherapy, and Moist heat  PLAN FOR NEXT SESSION: progress core stability, improve hip mobility consider using the Pilates reformer or tower for max benefit, functional goal   Malesha Suliman, PT  03/06/2024, 10:20 AM   Delon Norma, PT 03/06/24 10:20 AM Phone: [phone] Fax: [phone]

## 2024-03-10 ENCOUNTER — Ambulatory Visit (INDEPENDENT_AMBULATORY_CARE_PROVIDER_SITE_OTHER): Admitting: Physical Therapy

## 2024-03-10 ENCOUNTER — Encounter: Payer: Self-pay | Admitting: Physical Therapy

## 2024-03-10 DIAGNOSIS — M357 Hypermobility syndrome: Secondary | ICD-10-CM | POA: Diagnosis not present

## 2024-03-10 DIAGNOSIS — G8929 Other chronic pain: Secondary | ICD-10-CM

## 2024-03-10 DIAGNOSIS — R262 Difficulty in walking, not elsewhere classified: Secondary | ICD-10-CM

## 2024-03-10 DIAGNOSIS — M25511 Pain in right shoulder: Secondary | ICD-10-CM | POA: Diagnosis not present

## 2024-03-10 DIAGNOSIS — M25551 Pain in right hip: Secondary | ICD-10-CM | POA: Diagnosis not present

## 2024-03-10 NOTE — Therapy (Signed)
 OUTPATIENT PHYSICAL THERAPY NOTE   Patient Name: Shirley Fleming MRN: [id number] DOB:Feb 08, 1976, 48 y.o., female Today's Date: 03/10/2024  END OF SESSION:  PT End of Session - 03/10/24 1028     Visit Number 12    Number of Visits 18    Date for Recertification  04/23/24    Authorization Type [employer]    PT Start Time 1020    PT Stop Time 1100    PT Time Calculation (min) 40 min    Activity Tolerance Patient tolerated treatment well    Behavior During Therapy WFL for tasks assessed/performed             Past Medical History:  Diagnosis Date   Allergy 1993   Seasonal; mold, cats, trees, dust   Anemia    takes iron- heavy menstral cycle   Anxiety    on meds   Asthma    uses inhaler with exercise; Follows w/ PCP.   Depression 1995   Off and on since teens   Empty sella    Partially empty sella per MR brain 05/07/22, Follows w/ neurololgy.   GERD (gastroesophageal reflux disease)    OTC PRN meds   History of COVID-19 12/23/2020   Hypertension Jan 2021   resolved per pt, no meds   Hypoglycemia    Inflamed seborrheic keratosis 2024   Follows w/ [facility].   Migraine with aura    Follows w/ Dr. Venetia Potters, neurologist. See 05/07/22 MRI of brain in Epic.   Palpitations    after having Covid, takes Propranolol  prn, saw cardiologist, Dr. Lynwood Schilling, 06/17/20 Long term monitor in Epic   Pneumonia    mother was a smoker, pt states growing up she had pneumonia and bronchitis several times   Seasonal allergies    Wears glasses    for driving only   Past Surgical History:  Procedure Laterality Date   ABDOMINAL HYSTERECTOMY  Nov 2024   APPENDECTOMY  2003   COLONOSCOPY  02/09/2023   1 - 4mm polyp   CYSTOSCOPY N/A 03/09/2023   Procedure: CYSTOSCOPY;  Surgeon: Glennon Almarie POUR, MD;  Location: [facility];  Service: Gynecology;  Laterality: N/A;   ROBOTIC ASSISTED LAPAROSCOPIC HYSTERECTOMY AND SALPINGECTOMY Bilateral 03/09/2023   Procedure:  XI ROBOTIC ASSISTED LAPAROSCOPIC HYSTERECTOMY AND SALPINGECTOMY;  Surgeon: Glennon Almarie POUR, MD;  [facility];  Service: Gynecology;  Laterality: Bilateral;   TONSILLECTOMY AND ADENOIDECTOMY  1993   Patient Active Problem List   Diagnosis Date Noted   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   Multiple family members with Ehlers-Danlos syndrome 12/14/2023   At high risk for breast cancer 10/31/2021   Genetic testing 09/22/2021   Vitamin D deficiency 05/24/2020   Primary hypertension 05/24/2020   Migraine with aura and without status migrainosus, not intractable 05/24/2020   Anxiety 05/24/2020    PCP: Job Lukes PA   REFERRING PROVIDER: Joane Birmingham MD   REFERRING DIAG: [phone] (ICD-10-CM) - Chronic right hip pain  THERAPY DIAG:  Chronic right hip pain  Difficulty walking  Chronic right shoulder pain  Hypermobility syndrome  Rationale for Evaluation and Treatment: Rehabilitation  ONSET DATE: acute on chronic?   SUBJECTIVE:   SUBJECTIVE STATEMENT: Pt doing well, no hip pain or shoulder pain .  Wrists are taped.     Patient is familiar to me as I have treated her children in the recent past.  Deriona has had therapy about 2 years ago and had to stop due to  family issues.  She reports pain off and on since then but she cites that since her hysterectomy in November the pain has gotten a bit worse.  She feels like since she was not as active and then started to do more the pain has been increased in her right hip.  Recently on a trip to [place] she walked and did stairs excessively and she had a severe flareup. I constantly feel like its shifting out of place. I thought she was not able to walk, get home, etc.  The pain was excruciating at that time She notices pain mostly in standing and walking she tries to keep her weight symmetrical but when she does not things get out of alignment including her knee. The pain radiates down my back of thigh  and the groin, stops at the knee.  The whole leg feels like it is not aligned properly.  She started wearing a compressive knee brace and that does seem to help.  She knows that she is generally a hypermobile person or at least she was when she was in her teens and 5s she feel other pain in neck, upper back and knees.  She used to be an avid exerciser doing [employer] and running and is disappointed that she is not able to do anything currently without pain.   PERTINENT HISTORY: History of familial Ehlers-Danlos syndrome  Foot pain, hysterectomy ,migraines depression  PAIN:  Are you having pain? Yes: NPRS scale: 0/10 Pain location: Rt shoulder Pain description: tight Aggravating factors: using her arm  Relieving factors: Aleve     Yes: NPRS scale: 0/10  Pain location: Rt hip Pain description: tight, sore  Aggravating factors: walking, standing  Relieving factors: Aleve , exercise    PRECAUTIONS: None  RED FLAGS: None   WEIGHT BEARING RESTRICTIONS: No  FALLS:  Has patient fallen in last 6 months? 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   NEXT MD VISIT: A few weeks  OBJECTIVE:  Note: Objective measures were completed at Evaluation unless otherwise noted.  DIAGNOSTIC FINDINGS:  Normal XR recently   PATIENT SURVEYS:  LEFS  Extreme difficulty/unable (0), Quite a bit of difficulty (1), Moderate difficulty (2), Little difficulty (3), No difficulty (4)    COGNITION: Overall cognitive status: Within functional limits for tasks  assessed     SENSATION: Tingling Rt > LT. LE knee to feet   EDEMA:    MUSCLE LENGTH: WFL hamstrings and ant hip   POSTURE: knees hyperextended , mild swayback  PALPATION: Pain with palpation to the right anterolateral hip extending into soreness along the gluteus medius and minimus.  Min TTP  noted at the greater trochanter  LOWER EXTREMITY ROM: WNL    (Blank rows = not tested)  LOWER EXTREMITY MMT:  MMT Right eval Left eval  Hip flexion 5 5  Hip extension 5 5  Hip abduction 4+/5 pain  5  Hip adduction 4+ pain  5  Hip internal rotation    Hip external rotation    Knee flexion 5 5  Knee  extension 5 5  Ankle dorsiflexion 5 5  Ankle plantarflexion    Ankle inversion    Ankle eversion     (Blank rows = not tested)  LOWER EXTREMITY SPECIAL TESTS:  Hip special tests: Belvie (FABER) test: positive , Trendelenburg test: negative, Hip scouring test: positive , and Anterior hip impingement test: positive   FUNCTIONAL TESTS:  5 times sit to stand: 12 SLS WNL bilateral  Squat Rt hip pinching good form   SENSATION: Occ numbness, tingling   POSTURE: Rt shoulder more anterior   UPPER EXTREMITY ROM:   Active ROM Right 03/10/2024 Left 03/10/2024  Shoulder flexion Full, with Pinch at end range  WNL   Shoulder extension    Shoulder abduction    Shoulder adduction    Shoulder internal rotation FR to Mid back WNL Mid back  WNL   Shoulder external rotation Mid back, end range pain   Equal , WNL no pain     UPPER EXTREMITY MMT:  MMT Right 03/10/2024 Left 03/10/2024  Shoulder flexion 5 pain  5  Shoulder extension    Shoulder abduction 4 inc pain  5  Shoulder adduction    Shoulder internal rotation 5 5  Shoulder external rotation 4+ 4+  Middle trapezius    Lower trapezius    Elbow flexion 5 5  Elbow extension 5 5   SHOULDER SPECIAL TESTS:  Impingement tests: Painful arc test: positive    JOINT MOBILITY TESTING:  Hypermobile Rt GH joint  PALPATION:   Pain to anterior shoulder, humeral head press posterior                                                                                                                      TREATMENT DATE:   [facility] Adult PT Treatment:                                                DATE: 03/10/24 Therapeutic Exercise: Recumbent bike L2 for 6 min  Step down x 15 reverse each LE  Donkey kick (hip extension)  Latera step up with 10 lbs Step down (forward) in mirror  Lateral band walking blue band  Therapeutic Activity: Pilates Tower for LE/Core strength, postural strength, lumbopelvic disassociation and core control.  Exercises included: Sidelying Leg Springs 1 purple spring arcs, squat x 10  Roll down bar  x 10  Chest expansion/thigh stretch - quad focus   Oblique lift x 10 each side    [facility] Adult PT Treatment:                                                DATE: 03/06/24 Neuromuscular re-ed: Pilates Reformer used for LE/core strength, postural strength, lumbopelvic disassociation and core  control.  Exercises included: Footwork  Double leg Parallel heels, toes narrow and wide Pilates V heels and toes narrow and wide  2 red 1 green ball under pelvis  Single leg  Heel in parallel and turnout Double leg heel raise and calf stretch, prancing   SLR each LE x 10  Bridging x 10 with press out and pilates circle  Supine Arm work 1 red 1 yellow Arcs added chest lift x 6 with cues  Feet in Straps 1 red 1 blue arcs in parallel and turn out  Squat to wheel 1 red 1 blue  Seated ER, flexion and diagonal yellow  Tower: Chest press double arms  Seated bicep curls    [facility] Adult PT Treatment:                                                DATE: 02/27/24 Therapeutic Exercise: External rotation GTB Horizontal abd GTB  OH lift with band and then dowel (reduced pain with dowel, wide) Row, extension blue band Sidefacing ER and IR with walk out Reassessed hip PROM Rt shoulder mobility/PROM  Goal  check HEP   PATIENT EDUCATION:  Education details: See above education method: Explanation, Demonstration, and Handouts Education comprehension: verbalized understanding, returned demonstration, and needs further education  HOME EXERCISE PROGRAM: Access Code: [email or web address] URL: [email or web address] Date: 02/21/2024 Prepared by: Delon Norma  Exercises - Supine Bilateral Hip Internal Rotation Stretch  - 1 x daily - 7 x weekly - 2 sets - 10 reps - 5-10 hold - Sidelying Hip Circles  - 1 x daily - 7 x weekly - 2 sets - 10 reps - Supine Bridge with Resistance Band  - 1 x daily - 7 x weekly - 2 sets - 10 reps - 5 hold - Standing Hip Flexor Stretch  - 1 x daily - 7 x weekly - 1 sets - 3 reps - 15-20 hold - Single Straight Leg Hip Hinge With Dowel  - 1 x daily - 7 x weekly - 2 sets - 10 reps - 5 hold - Forward T with Counter Support  - 1 x daily - 7 x weekly - 2 sets - 10 reps - Standing Isometric Shoulder External Rotation with Doorway and Towel Roll  - 1 x daily - 7 x weekly - 2 sets - 10 reps - 5 hold - Standing Isometric Shoulder Extension with Doorway - Arm Bent  - 1 x daily - 7 x weekly - 2 sets - 10 reps - 5 hold  ASSESSMENT:  CLINICAL IMPRESSION: Patient continues to note improvement in both her shoulder and her hip pain.  She was able to complete a intermediate level Pilates class this weekend and workout here in clinic without increased pain.   She does have some hip instability which is evident in side-lying tower exercises.  This improved when she engages her abdominals a bit more and uses upper body support she will continue to benefit from skilled physical therapy for a few more sessions to fully integrate the concepts into her movement patterns.   Patient is a 48 y.o. female who was seen today for physical therapy evaluation and treatment for hip pain.  She presents with signs and symptoms consistent with FAI/impingement  OBJECTIVE IMPAIRMENTS: decreased mobility,  difficulty walking, decreased ROM, decreased strength, increased fascial restrictions, impaired flexibility, pain, and ?  Hypermobility.   ACTIVITY LIMITATIONS: carrying, lifting, bending, sitting, standing, squatting, sleeping, transfers, locomotion level, and caring for others  PARTICIPATION LIMITATIONS: cleaning, interpersonal relationship, driving, shopping, community activity, and occupation  PERSONAL FACTORS: Past/current experiences, Time since onset of injury/illness/exacerbation, and 1-2 comorbidities: history of hypermobility, polyarthralgia are also affecting patient's functional outcome.   REHAB POTENTIAL: Excellent  CLINICAL DECISION MAKING: Evolving/moderate complexity  EVALUATION COMPLEXITY: Moderate   GOALS: Goals reviewed with patient? Yes  SHORT TERM GOALS: Target date: 02/15/2024   Patient will be able to show independence for initial HEP to include posture, core and hip strength and stability.   Baseline: Goal status: MET   2.  Patient will be I with concepts of joint protection and stability as it pertains to joint hypermobility. Baseline:  Goal status MET   3.  Patient will report no hip pain with basic squat, sit to stand transfers Baseline: update: pain varies depending on activity.  She can have min pain with squatting  Goal status: ongoing   4.  Patient will be able to walk 20 minutes with minimal increase in pain in the right hip Baseline:  Goal status: MET   LONG TERM GOALS: Target date: 03/14/2024    Patient will improve LEFS score by 9 points or more Baseline: 39/80 Goal status:ongoing   2.  Patient be able to walk 30 minutes for exercise without increased right hip pain 75% of the time Baseline:  Goal status: ongoing   3.  Patient will be independent with home exercise program upon discharge Baseline:  Goal status: ongoing   4.  Patient will be able to carry light items up and down the stairs without increased hip pain  Baseline:   Goal status: MET   5.  Patient will be able to complete her ADLs , grooming with no increase in shoulder pain for 2 weeks in a row  Baseline: Goal status: NEW    6. Patient will notice less hip discomfort when standing for home tasks    Baseline:     Goal status: NEW   PLAN:  PT FREQUENCY: 1-2x/week Because her PT DURATION: 6 weeks  PLANNED INTERVENTIONS: 97164- PT Re-evaluation, 97750- Physical Performance Testing, 97110-Therapeutic exercises, 97530- Therapeutic activity, 97112- Neuromuscular re-education, 97535- Self Care, [id number]- Manual therapy, Patient/Family education, Balance training, Taping, Joint mobilization, Cryotherapy, and Moist heat  PLAN FOR NEXT SESSION: progress core stability, improve hip mobility consider using the Pilates reformer or tower for max benefit, functional goal   Ramal Eckhardt, PT  03/10/2024, 10:30 AM   Delon Norma, PT 03/10/24 10:30 AM Phone: [phone] Fax: [phone]

## 2024-03-14 NOTE — Progress Notes (Signed)
 NEUROLOGY FOLLOW UP OFFICE NOTE  Ivania Teagarden [id number]  Subjective:  Alphia Behanna is a 48 y.o. year old right-handed female with a medical history of HTN, migraine, asthma, depression, and hypermobility who we last saw on 09/19/23 for migraines.  To briefly review: Initial consult (04/05/22): Patient has pain in and around right ear and along jaw when chewing that started 01/23/22 while traveling. Her symptoms have mostly improved. She had a work up included a maxillofacial CT which showed an incidental partially empty sella. Given her pressure headaches and potential concern for IIH, patient was referred to neurology.   Patient has seen ENT. He thinks patient has inner ear problems (?pressure).   In terms of headaches, she has a history of migraines. She had sinus headaches as a child. She started getting migraines after having her son. She has pressure in her head, usually one sided, that will switch after a 18 hours and continue for 18 hours on the other side. She endorses photophobia, phonophobia, nausea. She also is sensitive to smells. She would normally get the headaches with her cycle. Of late, it has been more random, maybe none for 2 months, then maybe 2 per month. They typically last from 1-3 days. She can have head pain. She can take ibuprofen  that sometimes helps. She has never tried a migraine medication. She will lay down in a dark room and lay on the side of the headache for help. Her husband may also rub her neck and give some relief. She can wake up with headaches. Getting up and moving can help. Flonase can also help.   She endorses vision changes for about 1 year ago. She had COVID around that time. She has noticed her vision is worse. She will feel like sometimes one eye won't see correctly. She will blink and wait and things will get better. She was seen by her eye doctor who dilated her eyes and said that looked okay, but that she needed new prescription. She feels like  things are not as bright as usual as well, but can be too bright when having a migraine.   Patient is on B-complex and multivitamin. She is on propranolol  as needed due to occasional tachycardia after COVID. She takes Prozac  for depression and xanax  as needed.   EtOH: 1 glass of wine at night Caffeine: 2-3 glasses of coffee per day    06/07/22: Labs were significant for elevated B vitamins including B6. I recommended she stop any supplements, which she did.    MRI brain on 05/07/21 again showed a partially empty sella but was otherwise remarkable. She had a lumbar puncture on 05/24/22 with an opening pressure of 17 cm of water . Routine analysis was normal. She was headache free for a few days. The headaches returned but did not have a positional component to them. She thinks the vision might have been a little better for a few days as well.   She had low back pain and nausea for a couple of weeks after LP.   Since 03/2022 visit, patient has had 3-4 headaches. They tend to cluster on one side of the face at a time, but can be either side. She has nausea, photophobia, and phonophobia. She is taking propranolol , but this has not helped with headaches. She will take ibuprofen  for headaches, which sometimes helps.   She does not have a history of kidney stones.   07/25/22: Patient was not able to tolerate Topamax  (feeling off, body aches, URI like  symptoms). She weaned off starting 07/20/22. She stated her body temp was 95.17F despite everyone else in her family being normal. The next day after stopping, her symptoms went away. She was also very fatigued, which improved after stopping the medication.    Topamax  did help with headaches. She had one day of non-debilitating headache while on it. She had improvement of neck pain while on Topamax  as well.   She has wave like feeling of a headache coming on, but then going away. It is not with changing position though. Laying down does not change symptoms.  She had one day of aura with visual spots. She usually has headaches that cluster, particularly around her menstrual cycle.    Of note, patient currently takes Prozac  20 mg daily for depression and propranolol  10 mg daily for racing heart.   She did PT for her neck in the past, but many years ago.   09/13/22: Since last visit, patient has had 2 headaches. She had one really bad headache and took Imitrex , but the headache did not respond. The headache lasted 24-48 hours. She had another headache while out of town that she managed with ibuprofen . She is still more likely to get a headache around her cycle.   She continues to have neck pain as well.   03/16/23: Patient has had some additional improvement with increasing propranolol  to 10 mg BID. She has been taking ibuprofen  for headaches, which has been working. She has not taken Sumatriptan . She is currently getting about 1 headache per month that lasts a day or two. She thinks headaches are milder when she does get one. She has had 2 bad headaches. One was while prepping for colonoscopy and one was at altitude when in [place]. She continues to have some neck pain and headaches and noticed with one headache that an ice pack on her neck help a lot.    Patient had a hysterectomy on 03/09/23, so does not know how this will affect her headaches.   Current medications: Propranolol  10 mg BID, has sumatriptan  but using ibuprofen  Side effects: None  09/19/23: Patient went to PT for neck. She felt this really helped. She had cupping which she really liked. She still does the exercises.   She is still only getting about 1 headache per month. She tends to like ibuprofen  more than sumatriptan  for her headaches. She had one bad headache last week that may have been related to weather or lack of sleep. She has had 2 severe headaches in the last month. The headache can last a few days at times.    Current medications: -Propranolol  10 mg BID -Sumatriptan  or  ibuprofen  as needed (~3 times per week)   Side effects: Sumatriptan  may make her nauseated.   Patient wanted me to note that she has hypermobility and that her son was diagnosed with EDS.  Most recent Assessment and Plan (09/19/23): This is Cayley Pester, a 48 y.o. female with episodic migraine with aura - initial concern for positional component of HA so IIH was concerned. LP showed normal opening pressure. She also has significant neck pain that contributes to headaches. Patient could not tolerate Topamax , so this was weaned off ~07/20/22. Patient currently on propranolol  10 mg BID having about 1 headache per month. Her headaches do not respond well to sumatriptan  currently, as they can last for 3 days or longer. I will switch this to rizatriptan  today to see if this works better. Of note, she had a hysterectomy on  03/09/23.    Plan: Migraine prevention:  Continue propranolol  10 mg BID Migraine rescue:  Will switch sumatriptan  to rizatriptan  10 mg as needed at headache onset, can repeat after 2 hours if needed Limit use of pain relievers to no more than 2 days out of week to prevent risk of rebound or medication-overuse headache. Keep headache diary   -Continue home PT exercises for cervicalgia -Continue vitamin D 1000 international units daily  Since their last visit: Patient still thinks her headaches well controlled. She notices the association with neck pain. When she feels neck pain starting, she will take ibuprofen . This will help. She will do this 2-3 times per month. She gets 2-3 headaches a month maybe, less severe than at her initial visit. She took rizatriptan  once since her last visit. It made her sleepy. It helped about 50% with headache. Her headaches will still last at least 24 hours.  She is still taking propranolol  10 mg but now only taking it once a day at night. She thinks taking it in the day was making her more tired.  Her prozac  was increased from 30 mg to 40 mg since  last visit and thinks that also helped with headaches.  MEDICATIONS:  Outpatient Encounter Medications as of 03/27/2024  Medication Sig   albuterol  (VENTOLIN  HFA) 108 (90 Base) MCG/ACT inhaler Inhale 1-2 puffs into the lungs every 6 (six) hours as needed.   ALPRAZolam  (XANAX ) 0.25 MG tablet Take 1 tablet (0.25 mg total) by mouth every 8 (eight) hours as needed.   cholecalciferol (VITAMIN D3) 25 MCG (1000 UT) tablet Take 1,000 Units by mouth daily.   FLUoxetine  (PROZAC ) 40 MG capsule Take 1 capsule (40 mg total) by mouth daily.   fluticasone (FLONASE) 50 MCG/ACT nasal spray Place 1 spray into both nostrils as needed.   ibuprofen  (ADVIL ) 800 MG tablet Take 1 tablet (800 mg total) by mouth every 8 (eight) hours as needed.   meclizine  (ANTIVERT ) 25 MG tablet Take 0.5-1 tablets (12.5-25 mg total) by mouth 3 (three) times daily as needed for dizziness.   Multiple Vitamin (MULTIVITAMIN) tablet Take 1 tablet by mouth daily.   ondansetron  (ZOFRAN -ODT) 4 MG disintegrating tablet Take 1 tablet (4 mg total) by mouth every 8 (eight) hours as needed for nausea or vomiting.   propranolol  (INDERAL ) 10 MG tablet Take 1 tablet (10 mg total) by mouth every 12 (twelve) hours as needed.   rizatriptan  (MAXALT ) 10 MG tablet Take 1 tablet (10 mg total) by mouth as needed for migraine. May repeat in 2 hours if needed   Saline (SIMPLY SALINE) 0.9 % AERS Place 2 each into the nose as directed. Use nightly for sinus hygiene long-term.  Can also be used as many times daily as desired to assist with clearing congested sinuses.   ZEPBOUND  2.5 MG/0.5ML injection vial INJECT 0.5 ML (2.5 MG) UNDER THE SKIN ONCE WEEKLY (0.5ML= 50 UNITS)   No facility-administered encounter medications on file as of 03/27/2024.    PAST MEDICAL HISTORY: Past Medical History:  Diagnosis Date   Allergy 1993   Seasonal; mold, cats, trees, dust   Anemia    takes iron- heavy menstral cycle   Anxiety    on meds   Asthma    uses inhaler with  exercise; Follows w/ PCP.   Depression 1995   Off and on since teens   Empty sella    Partially empty sella per MR brain 05/07/22, Follows w/ neurololgy.   GERD (gastroesophageal reflux disease)  OTC PRN meds   History of COVID-19 12/23/2020   Hypertension Jan 2021   resolved per pt, no meds   Hypoglycemia    Inflamed seborrheic keratosis 2024   Follows w/ [facility].   Migraine with aura    Follows w/ Dr. Venetia Potters, neurologist. See 05/07/22 MRI of brain in Epic.   Palpitations    after having Covid, takes Propranolol  prn, saw cardiologist, Dr. Lynwood Schilling, 06/17/20 Long term monitor in Epic   Pneumonia    mother was a smoker, pt states growing up she had pneumonia and bronchitis several times   Seasonal allergies    Wears glasses    for driving only    PAST SURGICAL HISTORY: Past Surgical History:  Procedure Laterality Date   ABDOMINAL HYSTERECTOMY  Nov 2024   APPENDECTOMY  2003   COLONOSCOPY  02/09/2023   1 - 4mm polyp   CYSTOSCOPY N/A 03/09/2023   Procedure: CYSTOSCOPY;  Surgeon: Glennon Almarie POUR, MD;  Location: [facility];  Service: Gynecology;  Laterality: N/A;   ROBOTIC ASSISTED LAPAROSCOPIC HYSTERECTOMY AND SALPINGECTOMY Bilateral 03/09/2023   Procedure: XI ROBOTIC ASSISTED LAPAROSCOPIC HYSTERECTOMY AND SALPINGECTOMY;  Surgeon: Glennon Almarie POUR, MD;  Location: [facility];  Service: Gynecology;  Laterality: Bilateral;   TONSILLECTOMY AND ADENOIDECTOMY  1993    ALLERGIES: Allergies  Allergen Reactions   Doxycycline  Nausea Only   Erythromycin Nausea And Vomiting    FAMILY HISTORY: Family History  Problem Relation Age of Onset   Irritable bowel syndrome Mother    COPD Mother    Depression Mother    Heart attack Father 34       x 2   Hypertension Father    Heart disease Father    Hypothyroidism Brother    Other Brother        sepsis from knee surgery   Breast cancer Maternal Aunt        dx mid 62s    Diabetes Maternal Aunt    Hypertension Maternal Aunt    Cancer Maternal Aunt    Depression Maternal Aunt    Hypertension Maternal Aunt    Breast cancer Maternal Grandmother        dx 30s, d. 30s   Cancer Maternal Grandmother    Heart attack Maternal Grandfather    Heart disease Maternal Grandfather    Breast cancer Paternal Grandmother        dx 59s; + vaginal cancer 60s, jaw cancer 23s   Cancer Paternal Grandmother    Heart attack Paternal Grandfather    OCD Daughter    Anxiety disorder Daughter    ADD / ADHD Son    Other Son        hypermobile EDS   Autism Son    Colon polyps Neg Hx    Colon cancer Neg Hx    Esophageal cancer Neg Hx    Stomach cancer Neg Hx    Rectal cancer Neg Hx     SOCIAL HISTORY: Social History   Tobacco Use   Smoking status: Former    Current packs/day: 0.00    Types: Cigarettes    Start date: 04/24/1990    Quit date: 04/24/2005    Years since quitting: 18.9    Passive exposure: Past   Smokeless tobacco: Never  Vaping Use   Vaping status: Never Used  Substance Use Topics   Alcohol use: Yes    Alcohol/week: 2.0 standard drinks of alcohol    Types: 2 Glasses  of wine per week   Drug use: Never   Social History   Social History Narrative   Married   Two children    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 Right   Are you currently employed ? yes   What is your current occupation? home   Do you live at home alone? Husband and kids and Dad   Who lives with you?    What type of home do you live in: 1 story or 2 story? two    Caffeine 2-3 day      Objective:  Vital Signs:  BP 106/71   Pulse 78   Ht 5' 5.5 (1.664 m)   Wt 187 lb (84.8 kg)   LMP 02/11/2023 (Exact Date)   SpO2 98%   BMI 30.65 kg/m   General: No acute distress.  Patient appears well-groomed.   Head:  Normocephalic/atraumatic Eyes:  Fundi examined, disc margins clear, no obvious  papilledema Neck: supple, positive for paraspinal tenderness Lungs:  Non-labored breathing on room air Neurological Exam: alert and oriented.  Speech fluent and not dysarthric, language intact.  CN II-XII intact. Bulk and tone normal, muscle strength 5/5 throughout.  Sensation to light touch intact.  Deep tendon reflexes 2+ throughout.  Finger to nose testing intact.  Gait normal.   Labs and Imaging review: New results: 01/10/24: Lipid panel: tChol 211, LDL 140, TG 90.0 CMP unremarkable CBC w/ diff unremarkable  Previously reviewed results: 03/07/23: CBC unremarkable   01/02/23: CMP unremarkable Lipid panel: tChol 189, LDL 110, TG 135   04/05/22: B6: 36.7 B1: 38 B12: 1005 CSF: 0 R, 1 W, 37 P, 66 G; opening pressure 17 cm of water             Lab Results  Component Value Date    TSH 2.03 08/05/2021       CBC and CMP wnl on 11/29/21 Vit D wnl on 05/24/20   CT maxillofacial w contrast (01/27/22): IMPRESSION: Asymmetric soft tissue thickening and enhancement at the lateral aspect of the right external auditory canal, nonspecific but suspicious for acute otitis externa given the provided history. Direct visualization is recommended.   Partially empty sella turcica. While this finding often reflects incidental anatomic variation, it can also be associated with idiopathic intracranial hypertension (pseudotumor cerebri).   MRI brain wo contrast (05/07/22): IMPRESSION: 1. No evidence of an intracranial mass or acute infarct. 2. Partially empty sella turcica. This finding can reflect incidental anatomic variation, or alternatively, it can be associated with idiopathic intracranial hypertension (pseudotumor cerebri). 3. Otherwise unremarkable non-contrast MRI appearance of the brain.  Assessment/Plan:  This is Ryenn Howeth, a 48 y.o. female with episodic migraine with aura - initial concern for positional component of HA so IIH was concerned. LP showed normal opening pressure.  She also has significant neck pain that contributes to headaches. Patient could not tolerate Topamax , so this was weaned off ~07/20/22. Patient currently on propranolol  10 mg BID having about 1 headache per month. Her headaches did not respond well to sumatriptan  and while rizatriptan  is better, she still gets incomplete relief with headaches lasting at least 24 hours. She is having 2-3 headaches per month currently. She likely also has contributions from cervicalgia, perhaps with EDS as her son has it. Of note, she had a hysterectomy on 03/09/23.  Plan: Migraine prevention:  Continue propranolol  10 mg, currently taking once daily. Migraine  rescue:  Will give samples of Ubrelvy 100 mg to take as needed at headache onset. If this works better than rizatriptan , will stop rizatriptan  and order Ubrelvy. Limit use of pain relievers to no more than 2 days out of week to prevent risk of rebound or medication-overuse headache. Keep headache diary  -Continue home PT exercises for cervicalgia -Continue vitamin D 1000 international units daily  Return to clinic in 6 months  Total time spent reviewing records, interview, history/exam, documentation, and coordination of care on day of encounter:  35 min  Venetia Potters, MD

## 2024-03-17 ENCOUNTER — Ambulatory Visit (INDEPENDENT_AMBULATORY_CARE_PROVIDER_SITE_OTHER): Admitting: Physical Therapy

## 2024-03-17 ENCOUNTER — Encounter: Payer: Self-pay | Admitting: Physical Therapy

## 2024-03-17 DIAGNOSIS — M25511 Pain in right shoulder: Secondary | ICD-10-CM

## 2024-03-17 DIAGNOSIS — M357 Hypermobility syndrome: Secondary | ICD-10-CM | POA: Diagnosis not present

## 2024-03-17 DIAGNOSIS — G8929 Other chronic pain: Secondary | ICD-10-CM

## 2024-03-17 DIAGNOSIS — R262 Difficulty in walking, not elsewhere classified: Secondary | ICD-10-CM

## 2024-03-17 DIAGNOSIS — M25551 Pain in right hip: Secondary | ICD-10-CM | POA: Diagnosis not present

## 2024-03-17 NOTE — Therapy (Signed)
 OUTPATIENT PHYSICAL THERAPY NOTE   Patient Name: Shirley Fleming MRN: [id number] DOB:09-26-1975, 48 y.o., female Today's Date: 03/17/2024  END OF SESSION:  PT End of Session - 03/17/24 0935     Visit Number 13    Number of Visits 18    Date for Recertification  04/23/24    Authorization Type BC[employer]    PT Start Time 0932    PT Stop Time 1017    PT Time Calculation (min) 45 min    Activity Tolerance Patient tolerated treatment well    Behavior During Therapy WFL for tasks assessed/performed              Past Medical History:  Diagnosis Date   Allergy 1993   Seasonal; mold, cats, trees, dust   Anemia    takes iron- heavy menstral cycle   Anxiety    on meds   Asthma    uses inhaler with exercise; Follows w/ PCP.   Depression 1995   Off and on since teens   Empty sella    Partially empty sella per MR brain 05/07/22, Follows w/ neurololgy.   GERD (gastroesophageal reflux disease)    OTC PRN meds   History of COVID-19 12/23/2020   Hypertension Jan 2021   resolved per pt, no meds   Hypoglycemia    Inflamed seborrheic keratosis 2024   Follows w/ [facility].   Migraine with aura    Follows w/ Dr. Venetia Potters, neurologist. See 05/07/22 MRI of brain in Epic.   Palpitations    after having Covid, takes Propranolol  prn, saw cardiologist, Dr. Lynwood Schilling, 06/17/20 Long term monitor in Epic   Pneumonia    mother was a smoker, pt states growing up she had pneumonia and bronchitis several times   Seasonal allergies    Wears glasses    for driving only   Past Surgical History:  Procedure Laterality Date   ABDOMINAL HYSTERECTOMY  Nov 2024   APPENDECTOMY  2003   COLONOSCOPY  02/09/2023   1 - 4mm polyp   CYSTOSCOPY N/A 03/09/2023   Procedure: CYSTOSCOPY;  Surgeon: Glennon Almarie POUR, MD;  Location: [facility];  Service: Gynecology;  Laterality: N/A;   ROBOTIC ASSISTED LAPAROSCOPIC HYSTERECTOMY AND SALPINGECTOMY Bilateral 03/09/2023   Procedure:  XI ROBOTIC ASSISTED LAPAROSCOPIC HYSTERECTOMY AND SALPINGECTOMY;  Surgeon: Glennon Almarie POUR, MD;  Location: [facility];  Service: Gynecology;  Laterality: Bilateral;   TONSILLECTOMY AND ADENOIDECTOMY  1993   Patient Active Problem List   Diagnosis Date Noted   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   Multiple family members with Ehlers-Danlos syndrome 12/14/2023   At high risk for breast cancer 10/31/2021   Genetic testing 09/22/2021   Vitamin D deficiency 05/24/2020   Primary hypertension 05/24/2020   Migraine with aura and without status migrainosus, not intractable 05/24/2020   Anxiety 05/24/2020    PCP: Job Lukes PA   REFERRING PROVIDER: Joane Birmingham MD   REFERRING DIAG: [phone] (ICD-10-CM) - Chronic right hip pain  THERAPY DIAG:  Chronic right hip pain  Difficulty walking  Chronic right shoulder pain  Hypermobility syndrome  Rationale for Evaluation and Treatment: Rehabilitation  ONSET DATE: acute on chronic?   SUBJECTIVE:   SUBJECTIVE STATEMENT: Hip is hurting a little bit, sore.  I feel like my hip is a lot better since the Pilates and PT it is way less difficult to walk.  Shoulder is OK. Felt it the other day and I was able to keep it under control.  Patient is familiar to me as I have treated her children in the recent past.  Shirley Fleming has had therapy about 2 years ago and had to stop due to family issues.  She reports pain off and on since then but she cites that since her hysterectomy in November the pain has gotten a bit worse.  She feels like since she was not as active and then started to do more the pain has been increased in her right hip.  Recently on a trip to [place] she walked and did stairs excessively and she had a severe flareup. I constantly feel like its shifting out of place. I thought she was not able to walk, get home, etc.  The pain was excruciating at that time She notices pain mostly in standing and walking she tries  to keep her weight symmetrical but when she does not things get out of alignment including her knee. The pain radiates down my back of thigh and the groin, stops at the knee.  The whole leg feels like it is not aligned properly.  She started wearing a compressive knee brace and that does seem to help.  She knows that she is generally a hypermobile person or at least she was when she was in her teens and 11s she feel other pain in neck, upper back and knees.  She used to be an avid exerciser doing [employer] and running and is disappointed that she is not able to do anything currently without pain.   PERTINENT HISTORY: History of familial Ehlers-Danlos syndrome  Foot pain, hysterectomy ,migraines depression  PAIN:  Are you having pain? Yes: NPRS scale: 0/10 Pain location: Rt shoulder Pain description: tight Aggravating factors: using her arm  Relieving factors: Aleve     Yes: NPRS scale: 0/10  Pain location: Rt hip Pain description: tight, sore  Aggravating factors: walking, standing  Relieving factors: Aleve , exercise    PRECAUTIONS: None  RED FLAGS: None   WEIGHT BEARING RESTRICTIONS: No  FALLS:  Has patient fallen in last 6 months? 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   NEXT MD VISIT: A few weeks  OBJECTIVE:  Note: Objective measures were completed at Evaluation unless otherwise noted.  DIAGNOSTIC FINDINGS:  Normal XR recently   PATIENT SURVEYS:  LEFS  Extreme difficulty/unable (0), Quite a bit of difficulty (1), Moderate difficulty (2),  Little difficulty (3), No difficulty (4)    COGNITION: Overall cognitive status: Within functional limits for tasks assessed     SENSATION: Tingling Rt > LT. LE knee to feet   EDEMA:    MUSCLE LENGTH: WFL hamstrings and ant hip   POSTURE: knees hyperextended , mild swayback  PALPATION: Pain with palpation to the right anterolateral hip extending into soreness along the gluteus medius and minimus.  Min TTP  noted at the greater trochanter  LOWER EXTREMITY ROM: WNL    (Blank rows = not tested)  LOWER EXTREMITY MMT:  MMT Right eval Left eval  Hip flexion 5 5  Hip extension 5 5  Hip abduction  4+/5 pain  5  Hip adduction 4+ pain  5  Hip internal rotation    Hip external rotation    Knee flexion 5 5  Knee extension 5 5  Ankle dorsiflexion 5 5  Ankle plantarflexion    Ankle inversion    Ankle eversion     (Blank rows = not tested)  LOWER EXTREMITY SPECIAL TESTS:  Hip special tests: Belvie (FABER) test: positive , Trendelenburg test: negative, Hip scouring test: positive , and Anterior hip impingement test: positive   FUNCTIONAL TESTS:  5 times sit to stand: 12 SLS WNL bilateral  Squat Rt hip pinching good form   SENSATION: Occ numbness, tingling   POSTURE: Rt shoulder more anterior   UPPER EXTREMITY ROM:   Active ROM Right 03/17/2024 Left 03/17/2024  Shoulder flexion Full, with Pinch at end range  WNL   Shoulder extension    Shoulder abduction    Shoulder adduction    Shoulder internal rotation FR to Mid back WNL Mid back  WNL   Shoulder external rotation Mid back, end range pain   Equal , WNL no pain     UPPER EXTREMITY MMT:  MMT Right 03/17/2024 Left 03/17/2024  Shoulder flexion 5 pain  5  Shoulder extension    Shoulder abduction 4 inc pain  5  Shoulder adduction    Shoulder internal rotation 5 5  Shoulder external rotation 4+ 4+  Middle trapezius    Lower trapezius    Elbow flexion 5 5  Elbow extension 5 5   SHOULDER SPECIAL  TESTS:  Impingement tests: Painful arc test: positive    JOINT MOBILITY TESTING:  Hypermobile Rt GH joint  PALPATION:  Pain to anterior shoulder, humeral head press posterior                                                                                                                      TREATMENT DATE:    [facility] Adult PT Treatment:                                                DATE: 03/17/24 Therapeutic Exercise: TRX warm up squats TRX curtsy squat  Therapeutic Activity: Reformer Jumpboard intervals 1 min on 1 min off  Double leg and single leg variations Parallel, turn out narrow and wide Alternating positions  Single leg x 15 each x 2  Alternating legs  1 red 1 blue arm arcs  x 10 Sidelying jumpboard:   single leg jumping 1 red parallel and turn out  x 15 each   [facility] Adult PT Treatment:                                                DATE:  03/10/24 Therapeutic Exercise: Recumbent bike L2 for 6 min  Step down x 15 reverse each LE  Donkey kick (hip extension)  Latera step up with 10 lbs Step down (forward) in mirror  Lateral band walking blue band  Therapeutic Activity: Pilates Tower for LE/Core strength, postural strength, lumbopelvic disassociation and core control.  Exercises included: Sidelying Leg Springs 1 purple spring arcs, squat x 10  Roll down bar  x 10  Chest expansion/thigh stretch - quad focus   Oblique lift x 10 each side    [facility] Adult PT Treatment:                                                DATE: 03/06/24 Neuromuscular re-ed: Pilates Reformer used for LE/core strength, postural strength, lumbopelvic disassociation and core control.  Exercises included: Footwork  Double leg Parallel heels, toes narrow and wide Pilates V heels and toes narrow and wide  2 red 1 green ball under pelvis  Single leg  Heel in parallel and turnout Double leg heel raise and calf stretch, prancing   SLR each LE x 10  Bridging x 10 with press out and pilates circle   Supine Arm work 1 red 1 yellow Arcs added chest lift x 6 with cues  Feet in Straps 1 red 1 blue arcs in parallel and turn out  Squat to wheel 1 red 1 blue  Seated ER, flexion and diagonal yellow  Tower: Chest press double arms  Seated bicep curls    [facility] Adult PT Treatment:                                                DATE: 02/27/24 Therapeutic Exercise: External rotation GTB Horizontal abd GTB  OH lift with band and then dowel (reduced pain with dowel, wide) Row, extension blue band Sidefacing ER and IR with walk out Reassessed hip PROM Rt shoulder mobility/PROM  Goal check HEP   PATIENT EDUCATION:  Education details: See above education method: Explanation, Demonstration, and Handouts Education comprehension: verbalized understanding, returned demonstration, and needs further education  HOME EXERCISE PROGRAM: Access Code: [email or web address] URL: [email or web address] Date: 02/21/2024 Prepared by: Shirley Fleming  Exercises - Supine Bilateral Hip Internal Rotation Stretch  - 1 x daily - 7 x weekly - 2 sets - 10 reps - 5-10 hold - Sidelying Hip Circles  - 1 x daily - 7 x weekly - 2 sets - 10 reps - Supine Bridge with Resistance Band  - 1 x daily - 7 x weekly - 2 sets - 10 reps - 5 hold - Standing Hip Flexor Stretch  - 1 x daily - 7 x weekly - 1 sets - 3 reps - 15-20 hold - Single Straight Leg Hip Hinge With Dowel  - 1 x daily - 7 x weekly - 2 sets - 10 reps - 5 hold - Forward T with Counter Support  - 1 x daily - 7 x weekly - 2 sets - 10 reps - Standing Isometric Shoulder External Rotation with Doorway and Towel Roll  - 1 x daily - 7 x weekly - 2 sets - 10 reps - 5 hold - Standing Isometric Shoulder Extension with Doorway - Arm Bent  - 1  x daily - 7 x weekly - 2 sets - 10 reps - 5 hold  ASSESSMENT:  CLINICAL IMPRESSION: Shirley Fleming continues to feel the benefits of stabilization training and Pilates for both her upper and lower body.  She was able to go to an event  yesterday and do a great deal of standing, squatting down (no chairs) without increased hip pain. Cont POC.    Patient is a 48 y.o. female who was seen today for physical therapy evaluation and treatment for hip pain.  She presents with signs and symptoms consistent with FAI/impingement  OBJECTIVE IMPAIRMENTS: decreased mobility, difficulty walking, decreased ROM, decreased strength, increased fascial restrictions, impaired flexibility, pain, and ?  Hypermobility.   ACTIVITY LIMITATIONS: carrying, lifting, bending, sitting, standing, squatting, sleeping, transfers, locomotion level, and caring for others  PARTICIPATION LIMITATIONS: cleaning, interpersonal relationship, driving, shopping, community activity, and occupation  PERSONAL FACTORS: Past/current experiences, Time since onset of injury/illness/exacerbation, and 1-2 comorbidities: history of hypermobility, polyarthralgia are also affecting patient's functional outcome.   REHAB POTENTIAL: Excellent  CLINICAL DECISION MAKING: Evolving/moderate complexity  EVALUATION COMPLEXITY: Moderate   GOALS: Goals reviewed with patient? Yes  SHORT TERM GOALS: Target date: 02/15/2024   Patient will be able to show independence for initial HEP to include posture, core and hip strength and stability.   Baseline: Goal status: MET   2.  Patient will be I with concepts of joint protection and stability as it pertains to joint hypermobility. Baseline:  Goal status MET   3.  Patient will report no hip pain with basic squat, sit to stand transfers Baseline: update: pain varies depending on activity.  She can have min pain with squatting  Goal status: ongoing   4.  Patient will be able to walk 20 minutes with minimal increase in pain in the right hip Baseline:  Goal status: MET   LONG TERM GOALS: Target date: 03/14/2024    Patient will improve LEFS score by 9 points or more Baseline: 39/80 Goal status:ongoing   2.  Patient be able to  walk 30 minutes for exercise without increased right hip pain 75% of the time Baseline:  Goal status: ongoing   3.  Patient will be independent with home exercise program upon discharge Baseline:  Goal status: ongoing   4.  Patient will be able to carry light items up and down the stairs without increased hip pain  Baseline:  Goal status: MET   5.  Patient will be able to complete her ADLs , grooming with no increase in shoulder pain for 2 weeks in a row  Baseline: Goal status: NEW    6. Patient will notice less hip discomfort when standing for home tasks    Baseline:     Goal status: NEW   PLAN:  PT FREQUENCY: 1-2x/week  PT DURATION: 6 weeks  PLANNED INTERVENTIONS: 97164- PT Re-evaluation, 97750- Physical Performance Testing, 97110-Therapeutic exercises, 97530- Therapeutic activity, 97112- Neuromuscular re-education, 97535- Self Care, [id number]- Manual therapy, Patient/Family education, Balance training, Taping, Joint mobilization, Cryotherapy, and Moist heat  PLAN FOR NEXT SESSION: progress core stability, improve hip mobility consider using the Pilates reformer or tower for max benefit, functional goal   Heriberto Stmartin, PT  03/17/2024, 10:20 AM   Shirley Fleming, PT 03/17/24 10:20 AM Phone: [phone] Fax: [phone]

## 2024-03-18 DIAGNOSIS — F331 Major depressive disorder, recurrent, moderate: Secondary | ICD-10-CM | POA: Diagnosis not present

## 2024-03-18 DIAGNOSIS — F419 Anxiety disorder, unspecified: Secondary | ICD-10-CM | POA: Diagnosis not present

## 2024-03-24 ENCOUNTER — Ambulatory Visit (INDEPENDENT_AMBULATORY_CARE_PROVIDER_SITE_OTHER): Admitting: Physical Therapy

## 2024-03-24 ENCOUNTER — Encounter: Payer: Self-pay | Admitting: Physical Therapy

## 2024-03-24 DIAGNOSIS — M25511 Pain in right shoulder: Secondary | ICD-10-CM | POA: Diagnosis not present

## 2024-03-24 DIAGNOSIS — R262 Difficulty in walking, not elsewhere classified: Secondary | ICD-10-CM

## 2024-03-24 DIAGNOSIS — M357 Hypermobility syndrome: Secondary | ICD-10-CM

## 2024-03-24 DIAGNOSIS — G8929 Other chronic pain: Secondary | ICD-10-CM

## 2024-03-24 DIAGNOSIS — M25551 Pain in right hip: Secondary | ICD-10-CM | POA: Diagnosis not present

## 2024-03-24 NOTE — Therapy (Signed)
 OUTPATIENT PHYSICAL THERAPY NOTE   Patient Name: Shirley Fleming MRN: [id number] DOB:[birth year], 48 y.o., female Today's Date: 03/24/2024  END OF SESSION:  PT End of Session - 03/24/24 1021     Visit Number 14    Number of Visits 18    Date for Recertification  04/23/24    Authorization Type BC[employer]    PT Start Time 1019    PT Stop Time 1100    PT Time Calculation (min) 41 min    Activity Tolerance Patient tolerated treatment well    Behavior During Therapy WFL for tasks assessed/performed               Past Medical History:  Diagnosis Date   Allergy 1993   Seasonal; mold, cats, trees, dust   Anemia    takes iron- heavy menstral cycle   Anxiety    on meds   Asthma    uses inhaler with exercise; Follows w/ PCP.   Depression 1995   Off and on since teens   Empty sella    Partially empty sella per MR brain 05/07/22, Follows w/ neurololgy.   GERD (gastroesophageal reflux disease)    OTC PRN meds   History of COVID-19 12/23/2020   Hypertension Jan 2021   resolved per pt, no meds   Hypoglycemia    Inflamed seborrheic keratosis 2024   Follows w/ [facility].   Migraine with aura    Follows w/ Dr. Venetia Potters, neurologist. See 05/07/22 MRI of brain in Epic.   Palpitations    after having Covid, takes Propranolol  prn, saw cardiologist, Dr. Lynwood Schilling, 06/17/20 Long term monitor in Epic   Pneumonia    mother was a smoker, pt states growing up she had pneumonia and bronchitis several times   Seasonal allergies    Wears glasses    for driving only   Past Surgical History:  Procedure Laterality Date   ABDOMINAL HYSTERECTOMY  Nov 2024   APPENDECTOMY  2003   COLONOSCOPY  02/09/2023   1 - 4mm polyp   CYSTOSCOPY N/A 03/09/2023   Procedure: CYSTOSCOPY;  Surgeon: Glennon Almarie POUR, MD;  Location[facility];  Service: Gynecology;  Laterality: N/A;   ROBOTIC ASSISTED LAPAROSCOPIC HYSTERECTOMY AND SALPINGECTOMY Bilateral 03/09/2023    Procedure: XI ROBOTIC ASSISTED LAPAROSCOPIC HYSTERECTOMY AND SALPINGECTOMY;  Surgeon: Glennon Almarie POUR, MD;  Location: [facility];  Service: Gynecology;  Laterality: Bilateral;   TONSILLECTOMY AND ADENOIDECTOMY  1993   Patient Active Problem List   Diagnosis Date Noted   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   Multiple family members with Ehlers-Danlos syndrome 12/14/2023   At high risk for breast cancer 10/31/2021   Genetic testing 09/22/2021   Vitamin D deficiency 05/24/2020   Primary hypertension 05/24/2020   Migraine with aura and without status migrainosus, not intractable 05/24/2020   Anxiety 05/24/2020    PCP: Job Lukes PA   REFERRING PROVIDER: Joane Birmingham MD   REFERRING DIAG: [phone] (ICD-10-CM) - Chronic right hip pain  THERAPY DIAG:  Chronic right hip pain  Difficulty walking  Chronic right shoulder pain  Hypermobility syndrome  Rationale for Evaluation and Treatment: Rehabilitation  ONSET DATE: acute on chronic?   SUBJECTIVE:   SUBJECTIVE STATEMENT:  Hip was in agony last night. Right now, 3/10-4/10. Shoulder is OK. Did a lot of decoration, climbing a ladder, etc   Patient is familiar to me as I have treated her children in the recent past.  Shirley Fleming has had therapy about 2  years ago and had to stop due to family issues.  She reports pain off and on since then but she cites that since her hysterectomy in November the pain has gotten a bit worse.  She feels like since she was not as active and then started to do more the pain has been increased in her right hip.  Recently on a trip to [place] she walked and did stairs excessively and she had a severe flareup. I constantly feel like its shifting out of place. I thought she was not able to walk, get home, etc.  The pain was excruciating at that time She notices pain mostly in standing and walking she tries to keep her weight symmetrical but when she does not things get out of alignment  including her knee. The pain radiates down my back of thigh and the groin, stops at the knee.  The whole leg feels like it is not aligned properly.  She started wearing a compressive knee brace and that does seem to help.  She knows that she is generally a hypermobile person or at least she was when she was in her teens and 68s she feel other pain in neck, upper back and knees.  She used to be an avid exerciser doing [employer] and running and is disappointed that she is not able to do anything currently without pain.   PERTINENT HISTORY: History of familial Ehlers-Danlos syndrome  Foot pain, hysterectomy ,migraines depression  PAIN:  Are you having pain? Yes: NPRS scale:  OK /10 Pain location: Rt shoulder Pain description: tight Aggravating factors: using her arm  Relieving factors: Aleve     Yes: NPRS scale: 4/10  Pain location: Rt hip Pain description: tight, sore  Aggravating factors: walking, standing  Relieving factors: Aleve , exercise    PRECAUTIONS: None  RED FLAGS: None   WEIGHT BEARING RESTRICTIONS: No  FALLS:  Has patient fallen in last 6 months? 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   NEXT MD VISIT: A few weeks  OBJECTIVE:  Note: Objective measures were completed at Evaluation unless otherwise noted.  DIAGNOSTIC FINDINGS:  Normal XR recently   PATIENT SURVEYS:  LEFS  Extreme difficulty/unable (0), Quite a bit of difficulty (1), Moderate difficulty (2), Little difficulty (3), No difficulty (4)    COGNITION: Overall cognitive  status: Within functional limits for tasks assessed     SENSATION: Tingling Rt > LT. LE knee to feet   EDEMA:    MUSCLE LENGTH: WFL hamstrings and ant hip   POSTURE: knees hyperextended , mild swayback  PALPATION: Pain with palpation to the right anterolateral hip extending into soreness along the gluteus medius and minimus.  Min TTP  noted at the greater trochanter  LOWER EXTREMITY ROM: WNL    (Blank rows = not tested)  LOWER EXTREMITY MMT:  MMT Right eval Left eval  Hip flexion 5 5  Hip extension 5 5  Hip abduction 4+/5 pain  5  Hip adduction 4+ pain  5  Hip internal rotation    Hip external  rotation    Knee flexion 5 5  Knee extension 5 5  Ankle dorsiflexion 5 5  Ankle plantarflexion    Ankle inversion    Ankle eversion     (Blank rows = not tested)  LOWER EXTREMITY SPECIAL TESTS:  Hip special tests: Belvie (FABER) test: positive , Trendelenburg test: negative, Hip scouring test: positive , and Anterior hip impingement test: positive   FUNCTIONAL TESTS:  5 times sit to stand: 12 SLS WNL bilateral  Squat Rt hip pinching good form   SENSATION: Occ numbness, tingling   POSTURE: Rt shoulder more anterior   UPPER EXTREMITY ROM:   Active ROM Right 03/24/2024 Left 03/24/2024  Shoulder flexion Full, with Pinch at end range  WNL   Shoulder extension    Shoulder abduction    Shoulder adduction    Shoulder internal rotation FR to Mid back WNL Mid back  WNL   Shoulder external rotation Mid back, end range pain   Equal , WNL no pain     UPPER EXTREMITY MMT:  MMT Right 03/24/2024 Left 03/24/2024  Shoulder flexion 5 pain  5  Shoulder extension    Shoulder abduction 4 inc pain  5  Shoulder adduction    Shoulder internal rotation 5 5  Shoulder external rotation 4+ 4+  Middle trapezius    Lower trapezius    Elbow flexion 5 5  Elbow extension 5 5   SHOULDER SPECIAL TESTS:  Impingement tests: Painful arc test: positive    JOINT MOBILITY TESTING:   Hypermobile Rt GH joint  PALPATION:  Pain to anterior shoulder, humeral head press posterior                                                                                                                      TREATMENT DATE:   [facility] Adult PT Treatment:                                                DATE: 03/24/24 Therapeutic Exercise: Recumbent bike 6 min L2  Therapeutic Activity: Pilates Reformer used for LE/core strength, postural strength, lumbopelvic disassociation and core control.  Exercises included: Footwork Double leg Parallel heels, toes narrow and wide Pilates V heels wide   Single leg   SLR with VMO  Bridging with blue band 2 red 1 blue   Feet in Straps 1 red spring single leg in figure 4   Single leg stretch, scissors  Manual therapy:  Soft tissue work to [employer] post/lateral hip, PROM with compression in prone, Gr I-II P/A mobilization to Rt hip   [facility] Adult PT Treatment:  DATE: 03/17/24 Therapeutic Exercise: TRX warm up squats TRX curtsy squat  Therapeutic Activity: Reformer Jumpboard intervals 1 min on 1 min off  Double leg and single leg variations Parallel, turn out narrow and wide Alternating positions  Single leg x 15 each x 2  Alternating legs  1 red 1 blue arm arcs  x 10 Sidelying jumpboard:   single leg jumping 1 red parallel and turn out  x 15 each   [facility] Adult PT Treatment:                                                DATE: 03/10/24 Therapeutic Exercise: Recumbent bike L2 for 6 min  Step down x 15 reverse each LE  Donkey kick (hip extension)  Lateral step up with 10 lbs Step down (forward) in mirror  Lateral band walking blue band  Therapeutic Activity: Pilates Tower for LE/Core strength, postural strength, lumbopelvic disassociation and core control.  Exercises included: Sidelying Leg Springs 1 purple spring arcs, squat x 10  Roll down bar  x 10  Chest expansion/thigh stretch - quad focus    Oblique lift x 10 each side    [facility] Adult PT Treatment:                                                DATE: 03/06/24 Neuromuscular re-ed: Pilates Reformer used for LE/core strength, postural strength, lumbopelvic disassociation and core control.  Exercises included: Footwork  Double leg Parallel heels, toes narrow and wide Pilates V heels and toes narrow and wide  2 red 1 green ball under pelvis  Single leg  Heel in parallel and turnout Double leg heel raise and calf stretch, prancing   SLR each LE x 10  Bridging x 10 with press out and pilates circle  Supine Arm work 1 red 1 yellow Arcs added chest lift x 6 with cues  Feet in Straps 1 red 1 blue arcs in parallel and turn out  Squat to wheel 1 red 1 blue  Seated ER, flexion and diagonal yellow  Tower: Chest press double arms  Seated bicep curls    [facility] Adult PT Treatment:                                                DATE: 02/27/24 Therapeutic Exercise: External rotation GTB Horizontal abd GTB  OH lift with band and then dowel (reduced pain with dowel, wide) Row, extension blue band Sidefacing ER and IR with walk out Reassessed hip PROM Rt shoulder mobility/PROM  Goal check HEP   PATIENT EDUCATION:  Education details: See above education method: Explanation, Demonstration, and Handouts Education comprehension: verbalized understanding, returned demonstration, and needs further education  HOME EXERCISE PROGRAM: Access Code: [email or web address] URL: [email or web address] Date: 02/21/2024 Prepared by: Delon Norma  Exercises - Supine Bilateral Hip Internal Rotation Stretch  - 1 x daily - 7 x weekly - 2 sets - 10 reps - 5-10 hold - Sidelying Hip Circles  - 1 x daily - 7 x weekly - 2 sets - 10 reps - Supine Bridge with Resistance Band  -  1 x daily - 7 x weekly - 2 sets - 10 reps - 5 hold - Standing Hip Flexor Stretch  - 1 x daily - 7 x weekly - 1 sets - 3 reps - 15-20 hold - Single Straight Leg Hip Hinge With  Dowel  - 1 x daily - 7 x weekly - 2 sets - 10 reps - 5 hold - Forward T with Counter Support  - 1 x daily - 7 x weekly - 2 sets - 10 reps - Standing Isometric Shoulder External Rotation with Doorway and Towel Roll  - 1 x daily - 7 x weekly - 2 sets - 10 reps - 5 hold - Standing Isometric Shoulder Extension with Doorway - Arm Bent  - 1 x daily - 7 x weekly - 2 sets - 10 reps - 5 hold  ASSESSMENT:  CLINICAL IMPRESSION: The patient had an increase in hip pain today likely due to overactivity this weekend, the pain even radiated to the side of her leg.  Today it was a little bit better and she was able to perform Pilates reformer exercises without increasing pain.  Offered manual therapy to bring more blood circulation to the area to speed the healing.  She continues to feel the benefits of stabilization training and Pilates for both her upper and lower body.    Patient is a 48 y.o. female who was seen today for physical therapy evaluation and treatment for hip pain.  She presents with signs and symptoms consistent with FAI/impingement  OBJECTIVE IMPAIRMENTS: decreased mobility, difficulty walking, decreased ROM, decreased strength, increased fascial restrictions, impaired flexibility, pain, and ?  Hypermobility.   ACTIVITY LIMITATIONS: carrying, lifting, bending, sitting, standing, squatting, sleeping, transfers, locomotion level, and caring for others  PARTICIPATION LIMITATIONS: cleaning, interpersonal relationship, driving, shopping, community activity, and occupation  PERSONAL FACTORS: Past/current experiences, Time since onset of injury/illness/exacerbation, and 1-2 comorbidities: history of hypermobility, polyarthralgia are also affecting patient's functional outcome.   REHAB POTENTIAL: Excellent  CLINICAL DECISION MAKING: Evolving/moderate complexity  EVALUATION COMPLEXITY: Moderate   GOALS: Goals reviewed with patient? Yes  SHORT TERM GOALS: Target date: 02/15/2024   Patient  will be able to show independence for initial HEP to include posture, core and hip strength and stability.   Baseline: Goal status: MET   2.  Patient will be I with concepts of joint protection and stability as it pertains to joint hypermobility. Baseline:  Goal status MET   3.  Patient will report no hip pain with basic squat, sit to stand transfers Baseline: update: pain varies depending on activity.  She can have min pain with squatting  Goal status: ongoing   4.  Patient will be able to walk 20 minutes with minimal increase in pain in the right hip Baseline:  Goal status: MET   LONG TERM GOALS: Target date: 03/14/2024    Patient will improve LEFS score by 9 points or more Baseline: 39/80 Goal status:ongoing   2.  Patient be able to walk 30 minutes for exercise without increased right hip pain 75% of the time Baseline:  Goal status: ongoing   3.  Patient will be independent with home exercise program upon discharge Baseline:  Goal status: ongoing   4.  Patient will be able to carry light items up and down the stairs without increased hip pain  Baseline:  Goal status: MET   5.  Patient will be able to complete her ADLs , grooming with no increase in shoulder pain for  2 weeks in a row  Baseline: Goal status: NEW    6. Patient will notice less hip discomfort when standing for home tasks    Baseline:     Goal status: NEW   PLAN:  PT FREQUENCY: 1-2x/week  PT DURATION: 6 weeks  PLANNED INTERVENTIONS: 97164- PT Re-evaluation, 97750- Physical Performance Testing, 97110-Therapeutic exercises, 97530- Therapeutic activity, 97112- Neuromuscular re-education, 97535- Self Care, 02859- Manual therapy, Patient/Family education, Balance training, Taping, Joint mobilization, Cryotherapy, and Moist heat  PLAN FOR NEXT SESSION: progress core stability, improve hip mobility consider using the Pilates reformer or tower for max benefit, functional goal   Nadav Swindell, PT   03/24/2024, 11:19 AM   Delon Norma, PT 03/24/24 11:19 AM Phone: [phone] Fax: [phone]

## 2024-03-27 ENCOUNTER — Telehealth: Payer: Self-pay

## 2024-03-27 ENCOUNTER — Ambulatory Visit: Admitting: Neurology

## 2024-03-27 ENCOUNTER — Encounter: Payer: Self-pay | Admitting: Neurology

## 2024-03-27 VITALS — BP 106/71 | HR 78 | Ht 65.5 in | Wt 187.0 lb

## 2024-03-27 DIAGNOSIS — E559 Vitamin D deficiency, unspecified: Secondary | ICD-10-CM

## 2024-03-27 DIAGNOSIS — G43009 Migraine without aura, not intractable, without status migrainosus: Secondary | ICD-10-CM | POA: Diagnosis not present

## 2024-03-27 DIAGNOSIS — M542 Cervicalgia: Secondary | ICD-10-CM | POA: Diagnosis not present

## 2024-03-27 NOTE — Telephone Encounter (Signed)
 Samples of this drug were given to the patient, quantity 3, Lot Number [id number])  6/27 and [id number]( 1)  4-28

## 2024-03-27 NOTE — Patient Instructions (Signed)
 Migraine prevention:  Continue propranolol  10 mg, currently taking once daily. Migraine rescue:  Will give samples of Ubrelvy 100 mg to take as needed at headache onset. If this works better than rizatriptan , will stop rizatriptan  and order Ubrelvy. Please let me know what you think about Ubrelvy. Limit use of pain relievers to no more than 2 days out of week to prevent risk of rebound or medication-overuse headache. Keep headache diary  -Continue home PT exercises for neck pain -Continue vitamin D 1000 international units daily  Return to clinic in 6 months  Please let me know if you have any questions or concerns in the meantime.  The physicians and staff at [facility] are committed to providing excellent care. You may receive a survey requesting feedback about your experience at our office. We strive to receive very good responses to the survey questions. If you feel that your experience would prevent you from giving the office a very good  response, please contact our office to try to remedy the situation. We may be reached at [phone]. Thank you for taking the time out of your busy day to complete the survey.  Venetia Potters, MD [facility]

## 2024-04-01 ENCOUNTER — Ambulatory Visit: Admitting: Physical Therapy

## 2024-04-01 ENCOUNTER — Encounter: Payer: Self-pay | Admitting: Physical Therapy

## 2024-04-01 DIAGNOSIS — M357 Hypermobility syndrome: Secondary | ICD-10-CM

## 2024-04-01 DIAGNOSIS — M25551 Pain in right hip: Secondary | ICD-10-CM | POA: Diagnosis not present

## 2024-04-01 DIAGNOSIS — G8929 Other chronic pain: Secondary | ICD-10-CM

## 2024-04-01 DIAGNOSIS — M25511 Pain in right shoulder: Secondary | ICD-10-CM | POA: Diagnosis not present

## 2024-04-01 DIAGNOSIS — F419 Anxiety disorder, unspecified: Secondary | ICD-10-CM | POA: Diagnosis not present

## 2024-04-01 DIAGNOSIS — R262 Difficulty in walking, not elsewhere classified: Secondary | ICD-10-CM | POA: Diagnosis not present

## 2024-04-01 DIAGNOSIS — F331 Major depressive disorder, recurrent, moderate: Secondary | ICD-10-CM | POA: Diagnosis not present

## 2024-04-01 NOTE — Therapy (Signed)
 OUTPATIENT PHYSICAL THERAPY NOTE   Patient Name: Shirley Fleming MRN: [id number] DOB:09/17/1975, 48 y.o., female Today's Date: 04/01/2024  END OF SESSION:  PT End of Session - 04/01/24 1019     Visit Number 15    Number of Visits 18    Date for Recertification  04/23/24    Authorization Type BC[employer]    PT Start Time 1015    PT Stop Time 1100    PT Time Calculation (min) 45 min    Activity Tolerance Patient tolerated treatment well    Behavior During Therapy WFL for tasks assessed/performed                Past Medical History:  Diagnosis Date   Allergy 1993   Seasonal; mold, cats, trees, dust   Anemia    takes iron- heavy menstral cycle   Anxiety    on meds   Asthma    uses inhaler with exercise; Follows w/ PCP.   Depression 1995   Off and on since teens   Empty sella    Partially empty sella per MR brain 05/07/22, Follows w/ neurololgy.   GERD (gastroesophageal reflux disease)    OTC PRN meds   History of COVID-19 12/23/2020   Hypertension Jan 2021   resolved per pt, no meds   Hypoglycemia    Inflamed seborrheic keratosis 2024   Follows w/ [facility].   Migraine with aura    Follows w/ Dr. Venetia Potters, neurologist. See 05/07/22 MRI of brain in Epic.   Palpitations    after having Covid, takes Propranolol  prn, saw cardiologist, Dr. Lynwood Schilling, 06/17/20 Long term monitor in Epic   Pneumonia    mother was a smoker, pt states growing up she had pneumonia and bronchitis several times   Seasonal allergies    Wears glasses    for driving only   Past Surgical History:  Procedure Laterality Date   ABDOMINAL HYSTERECTOMY  Nov 2024   APPENDECTOMY  2003   COLONOSCOPY  02/09/2023   1 - 4mm polyp   CYSTOSCOPY N/A 03/09/2023   Procedure: CYSTOSCOPY;  Surgeon: Glennon Almarie POUR, MD;  Location: [facility];  Service: Gynecology;  Laterality: N/A;   ROBOTIC ASSISTED LAPAROSCOPIC HYSTERECTOMY AND SALPINGECTOMY Bilateral 03/09/2023    Procedure: XI ROBOTIC ASSISTED LAPAROSCOPIC HYSTERECTOMY AND SALPINGECTOMY;  Surgeon: Glennon Almarie POUR, MD;  Location: [facility];  Service: Gynecology;  Laterality: Bilateral;   TONSILLECTOMY AND ADENOIDECTOMY  1993   Patient Active Problem List   Diagnosis Date Noted   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   Multiple family members with Ehlers-Danlos syndrome 12/14/2023   At high risk for breast cancer 10/31/2021   Genetic testing 09/22/2021   Vitamin D deficiency 05/24/2020   Primary hypertension 05/24/2020   Migraine with aura and without status migrainosus, not intractable 05/24/2020   Anxiety 05/24/2020    PCP: Job Lukes PA   REFERRING PROVIDER: Joane Birmingham MD   REFERRING DIAG: [phone] (ICD-10-CM) - Chronic right hip pain  THERAPY DIAG:  Chronic right hip pain  Difficulty walking  Chronic right shoulder pain  Hypermobility syndrome  Rationale for Evaluation and Treatment: Rehabilitation  ONSET DATE: acute on chronic?   SUBJECTIVE:   SUBJECTIVE STATEMENT:  Sat. My hip gave out, I could barely walk was helping with dance and Sunday too.  Today it is better but still feeling sore and painful   Patient is familiar to me as I have treated her children in the recent past.  Shirley Fleming has had therapy about 2 years ago and had to stop due to family issues.  She reports pain off and on since then but she cites that since her hysterectomy in November the pain has gotten a bit worse.  She feels like since she was not as active and then started to do more the pain has been increased in her right hip.  Recently on a trip to [place] she walked and did stairs excessively and she had a severe flareup. I constantly feel like its shifting out of place. I thought she was not able to walk, get home, etc.  The pain was excruciating at that time She notices pain mostly in standing and walking she tries to keep her weight symmetrical but when she does not things get  out of alignment including her knee. The pain radiates down my back of thigh and the groin, stops at the knee.  The whole leg feels like it is not aligned properly.  She started wearing a compressive knee brace and that does seem to help.  She knows that she is generally a hypermobile person or at least she was when she was in her teens and 73s she feel other pain in neck, upper back and knees.  She used to be an avid exerciser doing [employer] and running and is disappointed that she is not able to do anything currently without pain.   PERTINENT HISTORY: History of familial Ehlers-Danlos syndrome  Foot pain, hysterectomy ,migraines depression  PAIN:  Are you having pain? Yes: NPRS scale:  OK /10 Pain location: Rt shoulder Pain description: tight Aggravating factors: using her arm  Relieving factors: Aleve      Mid back stiff and sore    Yes: NPRS scale: 5/10  Pain location: Rt hip Pain description: tight, sore  Aggravating factors: walking, standing  Relieving factors: Aleve , exercise    PRECAUTIONS: None  RED FLAGS: None   WEIGHT BEARING RESTRICTIONS: No  FALLS:  Has patient fallen in last 6 months? 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   NEXT MD VISIT: A few weeks  OBJECTIVE:  Note: Objective measures were completed at Evaluation unless otherwise noted.  DIAGNOSTIC FINDINGS:  Normal XR recently   PATIENT SURVEYS:  LEFS  Extreme difficulty/unable (0), Quite a bit of difficulty (1), Moderate difficulty (2), Little difficulty (3), No  difficulty (4)    COGNITION: Overall cognitive status: Within functional limits for tasks assessed     SENSATION: Tingling Rt > LT. LE knee to feet   EDEMA:    MUSCLE LENGTH: WFL hamstrings and ant hip   POSTURE: knees hyperextended , mild swayback  PALPATION: Pain with palpation to the right anterolateral hip extending into soreness along the gluteus medius and minimus.  Min TTP  noted at the greater trochanter  LOWER EXTREMITY ROM: WNL    (Blank rows = not tested)  LOWER EXTREMITY MMT:  MMT Right eval Left eval  Hip flexion 5 5  Hip extension 5 5  Hip abduction 4+/5 pain  5  Hip adduction 4+ pain  5  Hip internal rotation    Hip external rotation    Knee flexion 5 5  Knee extension 5 5  Ankle dorsiflexion 5 5  Ankle plantarflexion    Ankle inversion    Ankle eversion     (Blank rows = not tested)  LOWER EXTREMITY SPECIAL TESTS:  Hip special tests: Belvie (FABER) test: positive , Trendelenburg test: negative, Hip scouring test: positive , and Anterior hip impingement test: positive   FUNCTIONAL TESTS:  5 times sit to stand: 12 SLS WNL bilateral  Squat Rt hip pinching good form   SENSATION: Occ numbness, tingling   POSTURE: Rt shoulder more anterior   UPPER EXTREMITY ROM:   Active ROM Right 04/01/2024 Left 04/01/2024  Shoulder flexion Full, with Pinch at end range  WNL   Shoulder extension    Shoulder abduction    Shoulder adduction    Shoulder internal rotation FR to Mid back WNL Mid back  WNL   Shoulder external rotation Mid back, end range pain   Equal , WNL no pain     UPPER EXTREMITY MMT:  MMT Right 04/01/2024 Left 04/01/2024  Shoulder flexion 5 pain  5  Shoulder extension    Shoulder abduction 4 inc pain  5  Shoulder adduction    Shoulder internal rotation 5 5  Shoulder external rotation 4+ 4+  Middle trapezius    Lower trapezius    Elbow flexion 5 5  Elbow extension 5 5   SHOULDER SPECIAL TESTS:  Impingement tests: Painful  arc test: positive    JOINT MOBILITY TESTING:  Hypermobile Rt GH joint  PALPATION:  Pain to anterior shoulder, humeral head press posterior                                                                                                                      TREATMENT DATE:    [facility] Adult PT Treatment:                                                DATE: 04/01/24 Therapeutic Exercise: Recumbent bike 6 min L2  Open book x 5  Hip abduction circles  Therapeutic Activity: Quadruped Neutral spine Alternating UE and then LE x 5 each  Bird dog used 2 lbs cuff wgt on lumbar spine  Manual: Prone hip PROM with compression R>L  Thoracic and lumbar light compression  Supine cervical manual- along suboccipital ridge and lateral/post cervicals  Self Care: Discussed possible causes of dizziness  Cervical isometrics- given verbal info , has done in the past     [facility] Adult PT Treatment:  DATE: 03/24/24 Therapeutic Exercise: Recumbent bike 6 min L2  Therapeutic Activity: Pilates Reformer used for LE/core strength, postural strength, lumbopelvic disassociation and core control.  Exercises included: Footwork Double leg Parallel heels, toes narrow and wide Pilates V heels wide   Single leg   SLR with VMO  Bridging with blue band 2 red 1 blue   Feet in Straps 1 red spring single leg in figure 4   Single leg stretch, scissors  Manual therapy:  Soft tissue work to [employer] post/lateral hip, PROM with compression in prone, Gr I-II P/A mobilization to Rt hip   [facility] Adult PT Treatment:                                                DATE: 03/17/24 Therapeutic Exercise: TRX warm up squats TRX curtsy squat  Therapeutic Activity: Reformer Jumpboard intervals 1 min on 1 min off  Double leg and single leg variations Parallel, turn out narrow and wide Alternating positions  Single leg x 15 each x 2  Alternating legs  1 red 1 blue arm arcs  x 10 Sidelying  jumpboard:   single leg jumping 1 red parallel and turn out  x 15 each     PATIENT EDUCATION:  Education details: See above education method: Explanation, Demonstration, and Handouts Education comprehension: verbalized understanding, returned demonstration, and needs further education  HOME EXERCISE PROGRAM: Access Code: [email or web address] URL: [email or web address] Date: 02/21/2024 Prepared by: Delon Norma  Exercises - Supine Bilateral Hip Internal Rotation Stretch  - 1 x daily - 7 x weekly - 2 sets - 10 reps - 5-10 hold - Sidelying Hip Circles  - 1 x daily - 7 x weekly - 2 sets - 10 reps - Supine Bridge with Resistance Band  - 1 x daily - 7 x weekly - 2 sets - 10 reps - 5 hold - Standing Hip Flexor Stretch  - 1 x daily - 7 x weekly - 1 sets - 3 reps - 15-20 hold - Single Straight Leg Hip Hinge With Dowel  - 1 x daily - 7 x weekly - 2 sets - 10 reps - 5 hold - Forward T with Counter Support  - 1 x daily - 7 x weekly - 2 sets - 10 reps - Standing Isometric Shoulder External Rotation with Doorway and Towel Roll  - 1 x daily - 7 x weekly - 2 sets - 10 reps - 5 hold - Standing Isometric Shoulder Extension with Doorway - Arm Bent  - 1 x daily - 7 x weekly - 2 sets - 10 reps - 5 hold  ASSESSMENT:  CLINICAL IMPRESSION:  Patient is in a busy season with the holidays and her daughter's dance schedule, work  She had to do a lot of work, standing, bending over the weekend which likely aggravated her hip, back and neck. She has always had cervical pain and instability.  Offered manual for it today and given verbal info on cervical isometrics to assist in her ability to sit with her neck unsupported. She will cont POC. She continues to feel the benefits of stabilization training and Pilates for both her upper and lower body.    Patient is a 48 y.o. female who was seen today for physical therapy evaluation and treatment for hip pain.  She presents with signs and symptoms consistent with  FAI/impingement  OBJECTIVE IMPAIRMENTS: decreased mobility, difficulty walking, decreased ROM, decreased strength, increased fascial restrictions, impaired flexibility, pain, and ?  Hypermobility.   ACTIVITY LIMITATIONS: carrying, lifting, bending, sitting, standing, squatting, sleeping, transfers, locomotion level, and caring for others  PARTICIPATION LIMITATIONS: cleaning, interpersonal relationship, driving, shopping, community activity, and occupation  PERSONAL FACTORS: Past/current experiences, Time since onset of injury/illness/exacerbation, and 1-2 comorbidities: history of hypermobility, polyarthralgia are also affecting patient's functional outcome.   REHAB POTENTIAL: Excellent  CLINICAL DECISION MAKING: Evolving/moderate complexity  EVALUATION COMPLEXITY: Moderate   GOALS: Goals reviewed with patient? Yes  SHORT TERM GOALS: Target date: 02/15/2024   Patient will be able to show independence for initial HEP to include posture, core and hip strength and stability.   Baseline: Goal status: MET   2.  Patient will be I with concepts of joint protection and stability as it pertains to joint hypermobility. Baseline:  Goal status MET   3.  Patient will report no hip pain with basic squat, sit to stand transfers Baseline: update: pain varies depending on activity.  She can have min pain with squatting  Goal status: ongoing   4.  Patient will be able to walk 20 minutes with minimal increase in pain in the right hip Baseline:  Goal status: MET   LONG TERM GOALS: Target date: 03/14/2024    Patient will improve LEFS score by 9 points or more Baseline: 39/80 Goal status:ongoing   2.  Patient be able to walk 30 minutes for exercise without increased right hip pain 75% of the time Baseline:  Goal status: ongoing   3.  Patient will be independent with home exercise program upon discharge Baseline:  Goal status: ongoing   4.  Patient will be able to carry light items up  and down the stairs without increased hip pain  Baseline:  Goal status: MET   5.  Patient will be able to complete her ADLs , grooming with no increase in shoulder pain for 2 weeks in a row  Baseline: Goal status: NEW    6. Patient will notice less hip discomfort when standing for home tasks    Baseline:     Goal status: NEW   PLAN:  PT FREQUENCY: 1-2x/week  PT DURATION: 6 weeks  PLANNED INTERVENTIONS: 97164- PT Re-evaluation, 97750- Physical Performance Testing, 97110-Therapeutic exercises, 97530- Therapeutic activity, 97112- Neuromuscular re-education, 97535- Self Care, [id number]- Manual therapy, Patient/Family education, Balance training, Taping, Joint mobilization, Cryotherapy, and Moist heat  PLAN FOR NEXT SESSION: progress core stability, improve hip mobility consider using the Pilates reformer or tower for max benefit, functional goal   Mainor Hellmann, PT  04/01/2024, 12:05 PM   Delon Norma, PT 04/01/24 12:05 PM Phone: [phone] Fax: [phone]

## 2024-04-04 NOTE — Therapy (Deleted)
 OUTPATIENT PHYSICAL THERAPY NOTE   Patient Name: Shirley Fleming MRN: [id number] DOB:17-Jul-1975, 48 y.o., female Today's Date: 04/04/2024  END OF SESSION:          Past Medical History:  Diagnosis Date   Allergy 1993   Seasonal; mold, cats, trees, dust   Anemia    takes iron- heavy menstral cycle   Anxiety    on meds   Asthma    uses inhaler with exercise; Follows w/ PCP.   Depression 1995   Off and on since teens   Empty sella    Partially empty sella per MR brain 05/07/22, Follows w/ neurololgy.   GERD (gastroesophageal reflux disease)    OTC PRN meds   History of COVID-19 12/23/2020   Hypertension Jan 2021   resolved per pt, no meds   Hypoglycemia    Inflamed seborrheic keratosis 2024   Follows w/ [facility].   Migraine with aura    Follows w/ Dr. Venetia Potters, neurologist. See 05/07/22 MRI of brain in Epic.   Palpitations    after having Covid, takes Propranolol  prn, saw cardiologist, Dr. Lynwood Schilling, 06/17/20 Long term monitor in Epic   Pneumonia    mother was a smoker, pt states growing up she had pneumonia and bronchitis several times   Seasonal allergies    Wears glasses    for driving only   Past Surgical History:  Procedure Laterality Date   ABDOMINAL HYSTERECTOMY  Nov 2024   APPENDECTOMY  2003   COLONOSCOPY  02/09/2023   1 - 4mm polyp   CYSTOSCOPY N/A 03/09/2023   Procedure: CYSTOSCOPY;  Surgeon: Glennon Almarie POUR, MD;  Location: [facility];  Service: Gynecology;  Laterality: N/A;   ROBOTIC ASSISTED LAPAROSCOPIC HYSTERECTOMY AND SALPINGECTOMY Bilateral 03/09/2023   Procedure: XI ROBOTIC ASSISTED LAPAROSCOPIC HYSTERECTOMY AND SALPINGECTOMY;  Surgeon: Glennon Almarie POUR, MD;  Location: [facility];  Service: Gynecology;  Laterality: Bilateral;   TONSILLECTOMY AND ADENOIDECTOMY  1993   Patient Active Problem List   Diagnosis Date Noted   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  Rationale for Evaluation and Treatment: Rehabilitation  ONSET DATE: acute on chronic?   SUBJECTIVE:   SUBJECTIVE STATEMENT:  Sat. My hip gave out, I could barely walk was helping with dance and Sunday too.  Today it is better but still feeling sore and painful   Patient is familiar to me as I have treated her children in the recent past.  Candise has had therapy about 2 years ago and had to stop due to family issues.  She reports pain off and on since then but she cites that since her hysterectomy in November the pain has gotten a bit worse.  She feels like since she was not as active and then started to do more the pain has been increased in her right hip.  Recently on a trip to [place] she walked and did stairs excessively and she had a severe flareup. I constantly feel like its shifting out of place. I  thought she was not able to walk, get home, etc.  The pain was excruciating at that time She notices pain mostly in standing and walking she tries to keep her weight symmetrical but when she does not things get out of alignment including her knee. The pain radiates down my back of thigh and the groin, stops at the knee.  The whole leg feels like it is not aligned properly.  She started wearing a compressive knee brace and that does seem to help.  She knows that she is generally a hypermobile person or at least she was when she was in her teens and 71s she feel other pain in neck, upper back and knees.  She used to be an avid exerciser doing  [employer] and running and is disappointed that she is not able to do anything currently without pain.   PERTINENT HISTORY: History of familial Ehlers-Danlos syndrome  Foot pain, hysterectomy ,migraines depression  PAIN:  Are you having pain? Yes: NPRS scale:  OK /10 Pain location: Rt shoulder Pain description: tight Aggravating factors: using her arm  Relieving factors: Aleve      Mid back stiff and sore    Yes: NPRS scale: 5/10  Pain location: Rt hip Pain description: tight, sore  Aggravating factors: walking, standing  Relieving factors: Aleve , exercise    PRECAUTIONS: None  RED FLAGS: None   WEIGHT BEARING RESTRICTIONS: No  FALLS:  Has patient fallen in last 6 months? 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   NEXT MD VISIT: A few weeks  OBJECTIVE:  Note: Objective measures were completed at Evaluation unless otherwise noted.  DIAGNOSTIC FINDINGS:  Normal XR recently   PATIENT SURVEYS:  LEFS  Extreme difficulty/unable (0), Quite a bit of difficulty (1), Moderate difficulty (2), Little difficulty (3), No difficulty (4)    COGNITION: Overall cognitive status: Within functional limits for tasks assessed     SENSATION: Tingling Rt > LT. LE knee to feet   EDEMA:    MUSCLE LENGTH: WFL hamstrings and ant hip   POSTURE: knees hyperextended , mild swayback  PALPATION: Pain with palpation to the right anterolateral hip extending into soreness along the gluteus medius and minimus.  Min TTP  noted at the greater trochanter  LOWER  EXTREMITY ROM: WNL    (Blank rows = not tested)  LOWER EXTREMITY MMT:  MMT Right eval Left eval  Hip flexion 5 5  Hip extension 5 5  Hip abduction 4+/5 pain  5  Hip adduction 4+ pain  5  Hip internal rotation    Hip external rotation    Knee flexion 5 5  Knee extension 5 5  Ankle dorsiflexion 5 5  Ankle plantarflexion    Ankle inversion    Ankle eversion     (Blank rows = not tested)  LOWER EXTREMITY SPECIAL TESTS:  Hip special tests: Belvie (FABER) test: positive , Trendelenburg test: negative, Hip scouring test: positive , and Anterior hip impingement test: positive   FUNCTIONAL TESTS:  5 times sit to stand: 12 SLS  WNL bilateral  Squat Rt hip pinching good form   SENSATION: Occ numbness, tingling   POSTURE: Rt shoulder more anterior   UPPER EXTREMITY ROM:   Active ROM Right 04/04/2024 Left 04/04/2024  Shoulder flexion Full, with Pinch at end range  WNL   Shoulder extension    Shoulder abduction    Shoulder adduction    Shoulder internal rotation FR to Mid back WNL Mid back  WNL   Shoulder external rotation Mid back, end range pain   Equal , WNL no pain     UPPER EXTREMITY MMT:  MMT Right 04/04/2024 Left 04/04/2024  Shoulder flexion 5 pain  5  Shoulder extension    Shoulder abduction 4 inc pain  5  Shoulder adduction    Shoulder internal rotation 5 5  Shoulder external rotation 4+ 4+  Middle trapezius    Lower trapezius    Elbow flexion 5 5  Elbow extension 5 5   SHOULDER SPECIAL TESTS:  Impingement tests: Painful arc test: positive    JOINT MOBILITY TESTING:  Hypermobile Rt GH joint  PALPATION:  Pain to anterior shoulder, humeral head press posterior                                                                                                                      TREATMENT DATE:   [facility] Adult PT Treatment:                                                DATE: 04/07/24 Therapeutic Exercise: *** Manual Therapy: *** Neuromuscular  re-ed: *** Therapeutic Activity: *** Modalities: *** Self Care: ***  [facility] Adult PT Treatment:                                                DATE: 04/01/24 Therapeutic Exercise: Recumbent bike 6 min L2  Open book x 5  Hip abduction circles  Therapeutic Activity: Quadruped Neutral spine Alternating UE and then LE x 5 each  Bird dog used 2 lbs cuff wgt on lumbar spine  Manual: Prone hip PROM with compression R>L  Thoracic and lumbar light compression  Supine cervical manual- along suboccipital ridge and lateral/post cervicals  Self Care: Discussed possible causes of dizziness  Cervical isometrics- given verbal info , has done in the past     [facility] Adult PT Treatment:                                                DATE: 03/24/24 Therapeutic Exercise: Recumbent bike 6 min L2  Therapeutic Activity: Pilates Reformer used for LE/core strength,  postural strength, lumbopelvic disassociation and core control.  Exercises included: Footwork Double leg Parallel heels, toes narrow and wide Pilates V heels wide   Single leg   SLR with VMO  Bridging with blue band 2 red 1 blue   Feet in Straps 1 red spring single leg in figure 4   Single leg stretch, scissors  Manual therapy:  Soft tissue work to [employer] post/lateral hip, PROM with compression in prone, Gr I-II P/A mobilization to Rt hip   [facility] Adult PT Treatment:                                                DATE: 03/17/24 Therapeutic Exercise: TRX warm up squats TRX curtsy squat  Therapeutic Activity: Reformer Jumpboard intervals 1 min on 1 min off  Double leg and single leg variations Parallel, turn out narrow and wide Alternating positions  Single leg x 15 each x 2  Alternating legs  1 red 1 blue arm arcs  x 10 Sidelying jumpboard:   single leg jumping 1 red parallel and turn out  x 15 each     PATIENT EDUCATION:  Education details: See above education method: Explanation, Demonstration, and Handouts Education  comprehension: verbalized understanding, returned demonstration, and needs further education  HOME EXERCISE PROGRAM: Access Code: [email or web address] URL: [email or web address] Date: 02/21/2024 Prepared by: Delon Norma  Exercises - Supine Bilateral Hip Internal Rotation Stretch  - 1 x daily - 7 x weekly - 2 sets - 10 reps - 5-10 hold - Sidelying Hip Circles  - 1 x daily - 7 x weekly - 2 sets - 10 reps - Supine Bridge with Resistance Band  - 1 x daily - 7 x weekly - 2 sets - 10 reps - 5 hold - Standing Hip Flexor Stretch  - 1 x daily - 7 x weekly - 1 sets - 3 reps - 15-20 hold - Single Straight Leg Hip Hinge With Dowel  - 1 x daily - 7 x weekly - 2 sets - 10 reps - 5 hold - Forward T with Counter Support  - 1 x daily - 7 x weekly - 2 sets - 10 reps - Standing Isometric Shoulder External Rotation with Doorway and Towel Roll  - 1 x daily - 7 x weekly - 2 sets - 10 reps - 5 hold - Standing Isometric Shoulder Extension with Doorway - Arm Bent  - 1 x daily - 7 x weekly - 2 sets - 10 reps - 5 hold  ASSESSMENT:  CLINICAL IMPRESSION:  Patient is in a busy season with the holidays and her daughter's dance schedule, work  She had to do a lot of work, standing, bending over the weekend which likely aggravated her hip, back and neck. She has always had cervical pain and instability.  Offered manual for it today and given verbal info on cervical isometrics to assist in her ability to sit with her neck unsupported. She will cont POC. She continues to feel the benefits of stabilization training and Pilates for both her upper and lower body.    Patient is a 48 y.o. female who was seen today for physical therapy evaluation and treatment for hip pain.  She presents with signs and symptoms consistent with FAI/impingement  OBJECTIVE IMPAIRMENTS: decreased mobility, difficulty walking, decreased ROM, decreased strength, increased fascial restrictions, impaired flexibility, pain, and ?  Hypermobility.    ACTIVITY LIMITATIONS: carrying, lifting, bending, sitting, standing, squatting, sleeping, transfers, locomotion level, and caring for others  PARTICIPATION LIMITATIONS: cleaning, interpersonal relationship, driving, shopping, community activity, and occupation  PERSONAL FACTORS: Past/current experiences, Time since onset of injury/illness/exacerbation, and 1-2 comorbidities: history of hypermobility, polyarthralgia are also affecting patient's functional outcome.   REHAB POTENTIAL: Excellent  CLINICAL DECISION MAKING: Evolving/moderate complexity  EVALUATION COMPLEXITY: Moderate   GOALS: Goals reviewed with patient? Yes  SHORT TERM GOALS: Target date: 02/15/2024   Patient will be able to show independence for initial HEP to include posture, core and hip strength and stability.   Baseline: Goal status: MET   2.  Patient will be I with concepts of joint protection and stability as it pertains to joint hypermobility. Baseline:  Goal status MET   3.  Patient will report no hip pain with basic squat, sit to stand transfers Baseline: update: pain varies depending on activity.  She can have min pain with squatting  Goal status: ongoing   4.  Patient will be able to walk 20 minutes with minimal increase in pain in the right hip Baseline:  Goal status: MET   LONG TERM GOALS: Target date: 03/14/2024    Patient will improve LEFS score by 9 points or more Baseline: 39/80 Goal status:ongoing   2.  Patient be able to walk 30 minutes for exercise without increased right hip pain 75% of the time Baseline:  Goal status: ongoing   3.  Patient will be independent with home exercise program upon discharge Baseline:  Goal status: ongoing   4.  Patient will be able to carry light items up and down the stairs without increased hip pain  Baseline:  Goal status: MET   5.  Patient will be able to complete her ADLs , grooming with no increase in shoulder pain for 2 weeks in a row   Baseline: Goal status: NEW    6. Patient will notice less hip discomfort when standing for home tasks    Baseline:     Goal status: NEW   PLAN:  PT FREQUENCY: 1-2x/week  PT DURATION: 6 weeks  PLANNED INTERVENTIONS: 97164- PT Re-evaluation, 97750- Physical Performance Testing, 97110-Therapeutic exercises, 97530- Therapeutic activity, 97112- Neuromuscular re-education, 97535- Self Care, [id number]- Manual therapy, Patient/Family education, Balance training, Taping, Joint mobilization, Cryotherapy, and Moist heat  PLAN FOR NEXT SESSION: progress core stability, improve hip mobility consider using the Pilates reformer or tower for max benefit, functional goal   Konnie Noffsinger, PT  04/04/2024, 8:43 AM   Delon Norma, PT 04/04/2024 8:43 AM Phone: [phone] Fax: [phone]

## 2024-04-07 ENCOUNTER — Encounter: Admitting: Physical Therapy

## 2024-04-10 DIAGNOSIS — F331 Major depressive disorder, recurrent, moderate: Secondary | ICD-10-CM | POA: Diagnosis not present

## 2024-04-10 DIAGNOSIS — F419 Anxiety disorder, unspecified: Secondary | ICD-10-CM | POA: Diagnosis not present

## 2024-04-14 ENCOUNTER — Other Ambulatory Visit: Payer: Self-pay | Admitting: Physician Assistant

## 2024-04-14 ENCOUNTER — Encounter: Payer: Self-pay | Admitting: Physician Assistant

## 2024-04-14 DIAGNOSIS — N644 Mastodynia: Secondary | ICD-10-CM

## 2024-04-14 NOTE — Telephone Encounter (Signed)
 Please review and advise.

## 2024-04-15 ENCOUNTER — Other Ambulatory Visit: Payer: Self-pay | Admitting: *Deleted

## 2024-04-15 ENCOUNTER — Encounter: Payer: Self-pay | Admitting: Physician Assistant

## 2024-04-15 MED ORDER — TIRZEPATIDE-WEIGHT MANAGEMENT 7.5 MG/0.5ML ~~LOC~~ SOLN: 7.5000 mg | SUBCUTANEOUS | 0 refills | Status: DC

## 2024-04-18 ENCOUNTER — Encounter: Payer: Self-pay | Admitting: Physical Therapy

## 2024-04-18 ENCOUNTER — Ambulatory Visit: Admitting: Physical Therapy

## 2024-04-18 DIAGNOSIS — G8929 Other chronic pain: Secondary | ICD-10-CM

## 2024-04-18 DIAGNOSIS — R262 Difficulty in walking, not elsewhere classified: Secondary | ICD-10-CM

## 2024-04-18 DIAGNOSIS — M357 Hypermobility syndrome: Secondary | ICD-10-CM

## 2024-04-18 DIAGNOSIS — M25511 Pain in right shoulder: Secondary | ICD-10-CM

## 2024-04-18 DIAGNOSIS — M25551 Pain in right hip: Secondary | ICD-10-CM | POA: Diagnosis not present

## 2024-04-18 NOTE — Therapy (Signed)
 " OUTPATIENT PHYSICAL THERAPY NOTE   Patient Name: Shirley Fleming MRN: [id number] DOB:1975-11-08, 48 y.o., female Today's Date: 04/18/2024  END OF SESSION:  PT End of Session - 04/18/24 1019     Visit Number 16    Number of Visits 18    Date for Recertification  04/23/24    Authorization Type BCBS    PT Start Time 1015    PT Stop Time 1100    PT Time Calculation (min) 45 min    Activity Tolerance Patient tolerated treatment well    Behavior During Therapy WFL for tasks assessed/performed                 Past Medical History:  Diagnosis Date   Allergy 1993   Seasonal; mold, cats, trees, dust   Anemia    takes iron- heavy menstral cycle   Anxiety    on meds   Asthma    uses inhaler with exercise; Follows w/ PCP.   Depression 1995   Off and on since teens   Empty sella    Partially empty sella per MR brain 05/07/22, Follows w/ neurololgy.   GERD (gastroesophageal reflux disease)    OTC PRN meds   History of COVID-19 12/23/2020   Hypertension Jan 2021   resolved per pt, no meds   Hypoglycemia    Inflamed seborrheic keratosis 2024   Follows w/ [facility].   Migraine with aura    Follows w/ Dr. Venetia Potters, neurologist. See 05/07/22 MRI of brain in Epic.   Palpitations    after having Covid, takes Propranolol  prn, saw cardiologist, Dr. Lynwood Schilling, 06/17/20 Long term monitor in Epic   Pneumonia    mother was a smoker, pt states growing up she had pneumonia and bronchitis several times   Seasonal allergies    Wears glasses    for driving only   Past Surgical History:  Procedure Laterality Date   ABDOMINAL HYSTERECTOMY  Nov 2024   APPENDECTOMY  2003   COLONOSCOPY  02/09/2023   1 - 4mm polyp   CYSTOSCOPY N/A 03/09/2023   Procedure: CYSTOSCOPY;  Surgeon: Glennon Almarie POUR, MD;  Location: [facility];  Service: Gynecology;  Laterality: N/A;   ROBOTIC ASSISTED LAPAROSCOPIC HYSTERECTOMY AND SALPINGECTOMY Bilateral 03/09/2023    Procedure: XI ROBOTIC ASSISTED LAPAROSCOPIC HYSTERECTOMY AND SALPINGECTOMY;  Surgeon: Glennon Almarie POUR, MD;  Location: [facility];  Service: Gynecology;  Laterality: Bilateral;   TONSILLECTOMY AND ADENOIDECTOMY  1993   Patient Active Problem List   Diagnosis Date Noted   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   Multiple family members with Ehlers-Danlos syndrome 12/14/2023   At high risk for breast cancer 10/31/2021   Genetic testing 09/22/2021   Vitamin D deficiency 05/24/2020   Primary hypertension 05/24/2020   Migraine with aura and without status migrainosus, not intractable 05/24/2020   Anxiety 05/24/2020    PCP: Job Lukes PA   REFERRING PROVIDER: Joane Birmingham MD   REFERRING DIAG: [phone] (ICD-10-CM) - Chronic right hip pain  THERAPY DIAG:  Chronic right hip pain  Difficulty walking  Chronic right shoulder pain  Hypermobility syndrome  Rationale for Evaluation and Treatment: Rehabilitation  ONSET DATE: acute on chronic?   SUBJECTIVE:   SUBJECTIVE STATEMENT:  Hip pain has been on and off, when I turn the wrong way.  But better since I rested after the Nutcracker.   PERTINENT HISTORY: History of familial Ehlers-Danlos syndrome  Foot pain, hysterectomy ,migraines depression  PAIN:  Are you  having pain? Yes: NPRS scale:  OK /10 Pain location: Rt shoulder Pain description: tight Aggravating factors: using her arm  Relieving factors: Aleve      Mid back stiff and sore  Yes: NPRS scale: 5/10  Pain location: Rt hip Pain description: tight, sore  Aggravating factors: walking, standing  Relieving factors: Aleve , exercise    PRECAUTIONS: None  RED FLAGS: None   WEIGHT BEARING RESTRICTIONS: No  FALLS:  Has patient fallen in last 6 months? 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   NEXT MD VISIT: A few weeks  OBJECTIVE:  Note: Objective measures were completed at Evaluation unless otherwise noted.  DIAGNOSTIC FINDINGS:  Normal XR recently   PATIENT SURVEYS:  LEFS  Extreme difficulty/unable (0), Quite a bit of difficulty (1), Moderate difficulty (2), Little difficulty (3), No difficulty (4)    COGNITION: Overall cognitive status: Within functional limits for tasks assessed     SENSATION: Tingling Rt > LT. LE knee to feet   EDEMA:    MUSCLE LENGTH: WFL hamstrings and ant hip   POSTURE: knees hyperextended , mild swayback  PALPATION: Pain with palpation to the right anterolateral hip extending into soreness along the gluteus medius and minimus.  Min TTP  noted at the greater trochanter  LOWER EXTREMITY ROM: WNL    (Blank rows = not tested)  LOWER EXTREMITY MMT:  MMT Right eval Left eval  Hip flexion 5 5  Hip extension 5 5  Hip abduction 4+/5 pain  5  Hip adduction 4+ pain  5  Hip internal rotation    Hip external rotation    Knee flexion 5 5  Knee extension 5 5  Ankle dorsiflexion 5 5  Ankle plantarflexion    Ankle inversion    Ankle eversion     (Blank rows = not tested)  LOWER EXTREMITY SPECIAL TESTS:  Hip special tests: Belvie (FABER) test: positive , Trendelenburg test: negative, Hip scouring test: positive , and Anterior hip impingement test: positive   FUNCTIONAL TESTS:  5 times sit to stand: 12 SLS WNL bilateral  Squat Rt hip pinching good form   SENSATION: Occ numbness, tingling   POSTURE: Rt shoulder more anterior   UPPER EXTREMITY ROM:   Active ROM Right 04/18/2024 Left 04/18/2024  Shoulder flexion Full, with Pinch at  end range  WNL   Shoulder extension    Shoulder abduction    Shoulder adduction    Shoulder internal rotation FR to Mid back WNL Mid back  WNL   Shoulder external rotation Mid back, end range pain   Equal , WNL no pain     UPPER EXTREMITY MMT:  MMT Right 04/18/2024 Left 04/18/2024  Shoulder flexion 5 pain  5  Shoulder extension    Shoulder abduction 4 inc pain  5  Shoulder adduction    Shoulder internal rotation 5 5  Shoulder external rotation 4+ 4+  Middle trapezius    Lower trapezius    Elbow flexion 5 5  Elbow extension 5 5   SHOULDER SPECIAL TESTS:  Impingement tests: Painful arc test: positive    JOINT MOBILITY TESTING:  Hypermobile Rt GH joint  PALPATION:  Pain to anterior shoulder, humeral head press posterior                                                                                                                      TREATMENT DATE:   [facility] Adult PT Treatment:                                                DATE: 04/18/24 Therapeutic Exercise: Recumbent bike L2 for 5 min  Therapeutic Activity: Footwork  Double leg Parallel heels, toes narrow and wide Pilates V heels and toes narrow and wide  2 red 1 blue 1 yellow  Bridge with black band x 10  SLR x 10 top leg  Hamstring stretch  Single leg  Heel in parallel Double leg heel raise and calf stretch, prancing Supine arms and abs 1 red 1 blue   Arcs with leg extension   Added head lift and coordination  Feet in straps   1 red 1 blue Arcs, circles in inversion, Squats  Sideplank 1 red 1 yellow on knees added press out  Kneeling plank 1 red 1 yellow x 10 Seated single arm twist 1 red pacing tower x 15 each side  [facility] Adult PT Treatment:                                                DATE: 04/01/24 Therapeutic Exercise: Recumbent bike 6 min L2  Open book x 5  Hip abduction circles  Therapeutic Activity: Quadruped Neutral spine Alternating UE and then LE x 5 each  Bird dog used 2 lbs cuff wgt  on lumbar spine  Manual: Prone hip PROM with compression R>L  Thoracic and lumbar light compression  Supine cervical manual- along suboccipital ridge and lateral/post cervicals  Self Care: Discussed possible causes of dizziness  Cervical isometrics- given verbal info , has done in the past     [facility] Adult PT Treatment:                                                DATE: 03/24/24 Therapeutic Exercise: Recumbent bike 6 min L2  Therapeutic Activity: Pilates Reformer used for LE/core strength, postural strength, lumbopelvic disassociation and core control.  Exercises included: Footwork Double leg Parallel heels, toes narrow and wide Pilates V heels wide   Single leg   SLR with VMO  Bridging with blue band 2 red 1 blue   Feet in Straps 1 red spring single  leg in figure 4   Single leg stretch, scissors  Manual therapy:  Soft tissue work to [employer]-pl[employer] post/lateral hip, PROM with compression in prone, Gr I-II P/A mobilization to Rt hip   [facility] Adult PT Treatment:                                                DATE: 03/17/24 Therapeutic Exercise: TRX warm up squats TRX curtsy squat  Therapeutic Activity: Reformer Jumpboard intervals 1 min on 1 min off  Double leg and single leg variations Parallel, turn out narrow and wide Alternating positions  Single leg x 15 each x 2  Alternating legs  1 red 1 blue arm arcs  x 10 Sidelying jumpboard:   single leg jumping 1 red parallel and turn out  x 15 each     PATIENT EDUCATION:  Education details: See above education method: Explanation, Demonstration, and Handouts Education comprehension: verbalized understanding, returned demonstration, and needs further education  HOME EXERCISE PROGRAM: Access Code: [email or web address] URL: [email or web address] Date: 02/21/2024 Prepared by: Delon Norma  Exercises - Supine Bilateral Hip Internal Rotation Stretch  - 1 x daily - 7 x weekly - 2 sets - 10 reps - 5-10 hold - Sidelying Hip Circles  - 1  x daily - 7 x weekly - 2 sets - 10 reps - Supine Bridge with Resistance Band  - 1 x daily - 7 x weekly - 2 sets - 10 reps - 5 hold - Standing Hip Flexor Stretch  - 1 x daily - 7 x weekly - 1 sets - 3 reps - 15-20 hold - Single Straight Leg Hip Hinge With Dowel  - 1 x daily - 7 x weekly - 2 sets - 10 reps - 5 hold - Forward T with Counter Support  - 1 x daily - 7 x weekly - 2 sets - 10 reps - Standing Isometric Shoulder External Rotation with Doorway and Towel Roll  - 1 x daily - 7 x weekly - 2 sets - 10 reps - 5 hold - Standing Isometric Shoulder Extension with Doorway - Arm Bent  - 1 x daily - 7 x weekly - 2 sets - 10 reps - 5 hold  ASSESSMENT:  CLINICAL IMPRESSION: Patient continues to have min to moderate pain in the right hip occasionally the right shoulder and sometimes the neck.  She continues to be active and is getting good recovery from a busy holiday season with her daughter who dances competitively.  She has been able to take Pilates community classes on the weekends and understands that her joints need stabilization and protection.  She continues to feel the benefits of stabilization training and Pilates for both her upper and lower body.  She will have just another couple weeks of PT to reinforce the concepts she has learned thus far.    Patient is a 48 y.o. female who was seen today for physical therapy evaluation and treatment for hip pain.  She presents with signs and symptoms consistent with FAI/impingement  OBJECTIVE IMPAIRMENTS: decreased mobility, difficulty walking, decreased ROM, decreased strength, increased fascial restrictions, impaired flexibility, pain, and ?  Hypermobility.   ACTIVITY LIMITATIONS: carrying, lifting, bending, sitting, standing, squatting, sleeping, transfers, locomotion level, and caring for others  PARTICIPATION LIMITATIONS: cleaning, interpersonal relationship, driving, shopping, community activity, and occupation  PERSONAL FACTORS: Past/current  experiences, Time since  onset of injury/illness/exacerbation, and 1-2 comorbidities: history of hypermobility, polyarthralgia are also affecting patient's functional outcome.   REHAB POTENTIAL: Excellent  CLINICAL DECISION MAKING: Evolving/moderate complexity  EVALUATION COMPLEXITY: Moderate   GOALS: Goals reviewed with patient? Yes  SHORT TERM GOALS: Target date: 02/15/2024   Patient will be able to show independence for initial HEP to include posture, core and hip strength and stability.   Baseline: Goal status: MET   2.  Patient will be I with concepts of joint protection and stability as it pertains to joint hypermobility. Baseline:  Goal status MET   3.  Patient will report no hip pain with basic squat, sit to stand transfers Baseline: update: pain varies depending on activity.  She can have min pain with squatting  Goal status: ongoing   4.  Patient will be able to walk 20 minutes with minimal increase in pain in the right hip Baseline:  Goal status: MET   LONG TERM GOALS: Target date: 03/14/2024    Patient will improve LEFS score by 9 points or more Baseline: 39/80 Goal status:ongoing   2.  Patient be able to walk 30 minutes for exercise without increased right hip pain 75% of the time Baseline:  Goal status: ongoing   3.  Patient will be independent with home exercise program upon discharge Baseline:  Goal status: ongoing   4.  Patient will be able to carry light items up and down the stairs without increased hip pain  Baseline:  Goal status: MET   5.  Patient will be able to complete her ADLs , grooming with no increase in shoulder pain for 2 weeks in a row  Baseline: Goal status: NEW    6. Patient will notice less hip discomfort when standing for home tasks    Baseline:     Goal status: NEW   PLAN:  PT FREQUENCY: 1-2x/week  PT DURATION: 6 weeks  PLANNED INTERVENTIONS: 97164- PT Re-evaluation, 97750- Physical Performance Testing,  97110-Therapeutic exercises, 97530- Therapeutic activity, 97112- Neuromuscular re-education, 97535- Self Care, [id number]- Manual therapy, Patient/Family education, Balance training, Taping, Joint mobilization, Cryotherapy, and Moist heat  PLAN FOR NEXT SESSION: progress core stability, improve hip mobility consider using the Pilates reformer or tower for max benefit, functional goal   Betsie Peckman, PT  04/18/2024, 11:58 AM   Delon Norma, PT 04/18/2024 11:58 AM Phone: [phone] Fax: [phone]  "

## 2024-04-22 ENCOUNTER — Encounter: Admitting: Physical Therapy

## 2024-04-23 MED ORDER — TIRZEPATIDE-WEIGHT MANAGEMENT 5 MG/0.5ML ~~LOC~~ SOLN: 5.0000 mg | SUBCUTANEOUS | 0 refills | Status: DC

## 2024-04-25 ENCOUNTER — Ambulatory Visit: Admitting: Physical Therapy

## 2024-04-25 ENCOUNTER — Encounter: Payer: Self-pay | Admitting: Physical Therapy

## 2024-04-25 DIAGNOSIS — R262 Difficulty in walking, not elsewhere classified: Secondary | ICD-10-CM

## 2024-04-25 DIAGNOSIS — M357 Hypermobility syndrome: Secondary | ICD-10-CM | POA: Diagnosis not present

## 2024-04-25 DIAGNOSIS — M25551 Pain in right hip: Secondary | ICD-10-CM

## 2024-04-25 DIAGNOSIS — M25511 Pain in right shoulder: Secondary | ICD-10-CM | POA: Diagnosis not present

## 2024-04-25 DIAGNOSIS — G8929 Other chronic pain: Secondary | ICD-10-CM

## 2024-04-25 NOTE — Therapy (Addendum)
 " OUTPATIENT PHYSICAL THERAPY NOTE DISCHARGE   Patient Name: Shirley Fleming MRN: [id number] DOB:Jun 30, 1975, 49 y.o., female Today's Date: 04/25/2024    PHYSICAL THERAPY DISCHARGE SUMMARY  Visits from Start of Care: 17  Current functional level related to goals / functional outcomes: See below   Remaining deficits: Diffuse joint pain, functional limitations with walking   Education / Equipment: Extensive information regarding Ehlers-Danlos hypermobility syndrome stabilization Pilates body mechanics lifting and the importance of strength training  Patient agrees to discharge. Patient goals were met. Patient is being discharged due to maximized rehab potential.      END OF SESSION:  PT End of Session - 04/25/24 0851     Visit Number 17    Number of Visits 18    Date for Recertification  04/25/24    Authorization Type BCBS    PT Start Time 0848    PT Stop Time 0930    PT Time Calculation (min) 42 min    Activity Tolerance Patient tolerated treatment well    Behavior During Therapy WFL for tasks assessed/performed              Past Medical History:  Diagnosis Date   Allergy 1993   Seasonal; mold, cats, trees, dust   Anemia    takes iron- heavy menstral cycle   Anxiety    on meds   Asthma    uses inhaler with exercise; Follows w/ PCP.   Depression 1995   Off and on since teens   Empty sella    Partially empty sella per MR brain 05/07/22, Follows w/ neurololgy.   GERD (gastroesophageal reflux disease)    OTC PRN meds   History of COVID-19 12/23/2020   Hypertension Jan 2021   resolved per pt, no meds   Hypoglycemia    Inflamed seborrheic keratosis 2024   Follows w/ [facility].   Migraine with aura    Follows w/ Dr. Venetia Potters, neurologist. See 05/07/22 MRI of brain in Epic.   Palpitations    after having Covid, takes Propranolol  prn, saw cardiologist, Dr. Lynwood Schilling, 06/17/20 Long term monitor in Epic   Pneumonia    mother was a smoker, pt  states growing up she had pneumonia and bronchitis several times   Seasonal allergies    Wears glasses    for driving only   Past Surgical History:  Procedure Laterality Date   ABDOMINAL HYSTERECTOMY  Nov 2024   APPENDECTOMY  2003   COLONOSCOPY  02/09/2023   1 - 4mm polyp   CYSTOSCOPY N/A 03/09/2023   Procedure: CYSTOSCOPY;  Surgeon: Glennon Almarie POUR, MD;  Location: [facility];  Service: Gynecology;  Laterality: N/A;   ROBOTIC ASSISTED LAPAROSCOPIC HYSTERECTOMY AND SALPINGECTOMY Bilateral 03/09/2023   Procedure: XI ROBOTIC ASSISTED LAPAROSCOPIC HYSTERECTOMY AND SALPINGECTOMY;  Surgeon: Glennon Almarie POUR, MD;  Location: [facility];  Service: Gynecology;  Laterality: Bilateral;   TONSILLECTOMY AND ADENOIDECTOMY  1993   Patient Active Problem List   Diagnosis Date Noted   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   Multiple family members with Ehlers-Danlos syndrome 12/14/2023   At high risk for breast cancer 10/31/2021   Genetic testing 09/22/2021   Vitamin D deficiency 05/24/2020   Primary hypertension 05/24/2020   Migraine with aura and without status migrainosus, not intractable 05/24/2020   Anxiety 05/24/2020    PCP: Job Lukes PA   REFERRING PROVIDER: Joane Birmingham MD   REFERRING DIAG: [phone] (ICD-10-CM) - Chronic right hip pain  THERAPY DIAG:  Chronic right hip pain  Difficulty walking  Chronic right shoulder pain  Hypermobility syndrome  Rationale for Evaluation and Treatment: Rehabilitation  ONSET DATE: acute on chronic?   SUBJECTIVE:   SUBJECTIVE STATEMENT:  Pt was in [place] and had some hip pain during the trip. It wasn't severe and she had to rest quite a bit.  It wasn't as bad as it was in [place].     PERTINENT HISTORY: History of familial Ehlers-Danlos syndrome  Foot pain, hysterectomy ,migraines depression  PAIN:  Are you having pain? Yes: NPRS scale:  OK /10 Pain location: Rt shoulder Pain  description: tight Aggravating factors: using her arm  Relieving factors: Aleve      Mid back stiff and sore  Yes: NPRS scale: 5/10  Pain location: Rt hip Pain description: tight, sore  Aggravating factors: walking, standing  Relieving factors: Aleve , exercise    PRECAUTIONS: None  RED FLAGS: None   WEIGHT BEARING RESTRICTIONS: No  FALLS:  Has patient fallen in last 6 months? 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   NEXT MD VISIT: A few weeks  OBJECTIVE:  Note: Objective measures were completed at Evaluation unless otherwise noted.  DIAGNOSTIC FINDINGS:  Normal XR recently   PATIENT SURVEYS:  LEFS  Extreme difficulty/unable (0), Quite a bit of difficulty (1), Moderate difficulty (2), Little difficulty (3), No difficulty (4)    COGNITION: Overall cognitive status: Within functional limits for tasks assessed     SENSATION: Tingling Rt > LT. LE knee to feet   EDEMA:    MUSCLE LENGTH: WFL hamstrings and ant hip   POSTURE: knees hyperextended , mild swayback  PALPATION: Pain with palpation to the right anterolateral hip extending into soreness along the gluteus medius and minimus.  Min TTP  noted at the greater trochanter  LOWER EXTREMITY ROM: WNL    (Blank rows = not tested)  LOWER EXTREMITY MMT:  MMT Right eval Left eval  Hip flexion 5 5  Hip extension 5 5  Hip abduction 4+/5 pain  5  Hip adduction 4+ pain  5  Hip internal rotation    Hip external rotation    Knee flexion 5 5  Knee extension 5 5  Ankle dorsiflexion 5 5   Ankle plantarflexion    Ankle inversion    Ankle eversion     (Blank rows = not tested)  LOWER EXTREMITY SPECIAL TESTS:  Hip special tests: Belvie (FABER) test: positive , Trendelenburg test: negative, Hip scouring test: positive , and Anterior hip impingement test: positive   FUNCTIONAL TESTS:  5 times sit to stand: 12 SLS WNL bilateral  Squat Rt hip pinching good form   SENSATION: Occ numbness, tingling   POSTURE: Rt shoulder more anterior   UPPER EXTREMITY ROM:   Active ROM Right 04/25/2024 Left 04/25/2024  Shoulder flexion Full, with Pinch at end range  WNL   Shoulder extension    Shoulder abduction    Shoulder adduction    Shoulder internal rotation FR to Mid back WNL Mid back  WNL  Shoulder external rotation Mid back, end range pain   Equal , WNL no pain     UPPER EXTREMITY MMT:  MMT Right 04/25/2024 Left 04/25/2024  Shoulder flexion 5 pain  5  Shoulder extension    Shoulder abduction 4 inc pain  5  Shoulder adduction    Shoulder internal rotation 5 5  Shoulder external rotation 4+ 4+  Middle trapezius    Lower trapezius    Elbow flexion 5 5  Elbow extension 5 5   SHOULDER SPECIAL TESTS:  Impingement tests: Painful arc test: positive    JOINT MOBILITY TESTING:  Hypermobile Rt GH joint  PALPATION:  Pain to anterior shoulder, humeral head press posterior                                                                                                                      TREATMENT DATE:    [facility] Adult PT Treatment:                                                DATE: 04/25/24 Therapeutic Exercise: Core routine: Lat pull over 90/90 90/90 hold  + knee extension  Dead bug with compression 8 lbs  Bridging  +march  +SLR  Stretching to hips: 3 way ITB hamstring and adductors  Manual Therapy: Soft tissue compression to Rt glute med and post/lateral  Self Care: Goals, DC and plan of care, core routine     [facility] Adult PT Treatment:                                                 DATE: 04/18/24 Therapeutic Exercise: Recumbent bike L2 for 5 min  Therapeutic Activity: Footwork  Double leg Parallel heels, toes narrow and wide Pilates V heels and toes narrow and wide  2 red 1 blue 1 yellow  Bridge with black band x 10  SLR x 10 top leg  Hamstring stretch  Single leg  Heel in parallel Double leg heel raise and calf stretch, prancing Supine arms and abs 1 red 1 blue   Arcs with leg extension   Added head lift and coordination  Feet in straps   1 red 1 blue Arcs, circles in inversion, Squats  Sideplank 1 red 1 yellow on knees added press out  Kneeling plank 1 red 1 yellow x 10 Seated single arm twist 1 red pacing tower x 15 each side     PATIENT EDUCATION:  Education details: See above education method: Explanation, Demonstration, and Handouts Education comprehension: verbalized understanding, returned demonstration, and needs further education  HOME EXERCISE PROGRAM: Access Code: [email or web address] URL: [email or web address] Date: 04/25/2024 Prepared by: Delon Norma  Exercises - Supine Bilateral Hip Internal Rotation Stretch  -  1 x daily - 7 x weekly - 2 sets - 10 reps - 5-10 hold - Sidelying Hip Circles  - 1 x daily - 7 x weekly - 2 sets - 10 reps - Supine Bridge with Resistance Band  - 1 x daily - 7 x weekly - 2 sets - 10 reps - 5 hold - Standing Hip Flexor Stretch  - 1 x daily - 7 x weekly - 1 sets - 3 reps - 15-20 hold - Single Straight Leg Hip Hinge With Dowel  - 1 x daily - 7 x weekly - 2 sets - 10 reps - 5 hold - Forward T with Counter Support  - 1 x daily - 7 x weekly - 2 sets - 10 reps - Standing Isometric Shoulder External Rotation with Doorway and Towel Roll  - 1 x daily - 7 x weekly - 2 sets - 10 reps - 5 hold - Standing Isometric Shoulder Extension with Doorway - Arm Bent  - 1 x daily - 7 x weekly - 2 sets - 10 reps - 5 hold - Shoulder External Rotation and Scapular Retraction with Resistance  - 1 x daily - 7  x weekly - 2 sets - 10 reps - 5 hold - Isometric Dead Bug  - 1 x daily - 7 x weekly - 2 sets - 5 reps - 30 hold - Supine Dead Bug with Leg Extension  - 1 x daily - 7 x weekly - 2 sets - 10 reps - 5 hold - Single Leg Bridge  - 1 x daily - 7 x weekly - 2 sets - 10 reps - 5 hold   ASSESSMENT:  CLINICAL IMPRESSION:  Shirley Fleming has met or partially met her goals with physical therapy she continues to have hip pain when standing and walking long periods.  Her shoulder has improved a great deal some mild back and neck issue as well overall she has shown improved strength and stability and attends weekly Pilates classes.  She also bikes 8 or 10 miles a few times a week.  At this time we are going to discharge from physical therapy knowing that she is okay to come back at a later date with a new referral she has a good plan in which to continue building up good habits and core stability which will help her long-term patient is agreeable to the plan    Patient is a 49 y.o. female who was seen today for physical therapy evaluation and treatment for hip pain.  She presents with signs and symptoms consistent with FAI/impingement  OBJECTIVE IMPAIRMENTS: decreased mobility, difficulty walking, decreased ROM, decreased strength, increased fascial restrictions, impaired flexibility, pain, and ?  Hypermobility.   ACTIVITY LIMITATIONS: carrying, lifting, bending, sitting, standing, squatting, sleeping, transfers, locomotion level, and caring for others  PARTICIPATION LIMITATIONS: cleaning, interpersonal relationship, driving, shopping, community activity, and occupation  PERSONAL FACTORS: Past/current experiences, Time since onset of injury/illness/exacerbation, and 1-2 comorbidities: history of hypermobility, polyarthralgia are also affecting patient's functional outcome.   REHAB POTENTIAL: Excellent  CLINICAL DECISION MAKING: Evolving/moderate complexity  EVALUATION COMPLEXITY: Moderate   GOALS: Goals  reviewed with patient? Yes  SHORT TERM GOALS: Target date: 02/15/2024   Patient will be able to show independence for initial HEP to include posture, core and hip strength and stability.   Baseline: Goal status: MET   2.  Patient will be I with concepts of joint protection and stability as it pertains to joint hypermobility. Baseline:  Goal status MET  3.  Patient will report no hip pain with basic squat, sit to stand transfers Baseline: update: pain varies depending on activity.  She can have min pain with squatting  Goal status: partially met   4.  Patient will be able to walk 20 minutes with minimal increase in pain in the right hip Baseline:  Goal status: MET   LONG TERM GOALS: Target date: 03/14/2024    Patient will improve LEFS score by 9 points or more Baseline: 39/80 Goal status:MET - scored 64/80!  2.  Patient be able to walk 30 minutes for exercise without increased right hip pain 75% of the time Baseline:  Goal status: MET  3.  Patient will be independent with home exercise program upon discharge Baseline:  Goal status: MET   4.  Patient will be able to carry light items up and down the stairs without increased hip pain  Baseline:  Goal status: MET   5.  Patient will be able to complete her ADLs, grooming with no increase in shoulder pain for 2 weeks in a row  Baseline: Goal status: MET, has some wrist and hand    6. Patient will notice less hip discomfort when standing for home tasks    Baseline:     Goal status: partially met, inconsistent   PLAN:  PT FREQUENCY: 1-2x/week  PT DURATION: 6 weeks  PLANNED INTERVENTIONS: 97164- PT Re-evaluation, 97750- Physical Performance Testing, 97110-Therapeutic exercises, 97530- Therapeutic activity, [id number]- Neuromuscular re-education, 97535- Self Care, [id number]- Manual therapy, Patient/Family education, Balance training, Taping, Joint mobilization, Cryotherapy, and Moist heat  PLAN FOR NEXT SESSION:DC from PT for  now.   Emmajo Bennette, PT  04/25/2024, 10:50 AM   Delon Norma, PT 04/25/2024 10:50 AM Phone: [phone] Fax: [phone]  "

## 2024-05-02 ENCOUNTER — Encounter: Payer: Self-pay | Admitting: Neurology

## 2024-05-02 ENCOUNTER — Other Ambulatory Visit: Payer: Self-pay | Admitting: Neurology

## 2024-05-02 ENCOUNTER — Other Ambulatory Visit: Payer: Self-pay | Admitting: Physician Assistant

## 2024-05-02 ENCOUNTER — Other Ambulatory Visit (HOSPITAL_BASED_OUTPATIENT_CLINIC_OR_DEPARTMENT_OTHER): Payer: Self-pay

## 2024-05-02 DIAGNOSIS — N644 Mastodynia: Secondary | ICD-10-CM

## 2024-05-02 DIAGNOSIS — G43009 Migraine without aura, not intractable, without status migrainosus: Secondary | ICD-10-CM

## 2024-05-02 MED ORDER — UBRELVY 100 MG PO TABS
100.0000 mg | ORAL_TABLET | ORAL | 5 refills | Status: AC | PRN
  Filled 2024-05-02: qty 10, 30d supply, fill #0

## 2024-05-07 ENCOUNTER — Other Ambulatory Visit (HOSPITAL_BASED_OUTPATIENT_CLINIC_OR_DEPARTMENT_OTHER): Payer: Self-pay

## 2024-05-12 ENCOUNTER — Other Ambulatory Visit (HOSPITAL_BASED_OUTPATIENT_CLINIC_OR_DEPARTMENT_OTHER): Payer: Self-pay

## 2024-05-12 ENCOUNTER — Other Ambulatory Visit (HOSPITAL_COMMUNITY): Payer: Self-pay

## 2024-05-12 ENCOUNTER — Telehealth: Payer: Self-pay

## 2024-05-12 ENCOUNTER — Other Ambulatory Visit: Payer: Self-pay | Admitting: Physician Assistant

## 2024-05-13 ENCOUNTER — Telehealth: Payer: Self-pay | Admitting: Pharmacy Technician

## 2024-05-13 ENCOUNTER — Other Ambulatory Visit (HOSPITAL_BASED_OUTPATIENT_CLINIC_OR_DEPARTMENT_OTHER): Payer: Self-pay

## 2024-05-13 ENCOUNTER — Other Ambulatory Visit (HOSPITAL_COMMUNITY): Payer: Self-pay

## 2024-05-13 MED ORDER — PROPRANOLOL HCL 10 MG PO TABS
10.0000 mg | ORAL_TABLET | Freq: Two times a day (BID) | ORAL | 1 refills | Status: AC | PRN
  Filled 2024-05-13: qty 180, 90d supply, fill #0

## 2024-05-13 NOTE — Telephone Encounter (Signed)
 PA has been submitted, and telephone encounter has been created. Please see telephone encounter dated 1.20.26.  Yes, stay warm as well Ms Charlies :-)

## 2024-05-13 NOTE — Telephone Encounter (Signed)
 Pharmacy Patient Advocate Encounter  Received notification from [employer] that Prior Authorization for UBRELVY  100MG  has been APPROVED from 12.20.25 to 1.19.27. Ran test claim, Copay is $0. This test claim was processed through [facility]- copay amounts may vary at other pharmacies due to pharmacy/plan contracts, or as the patient moves through the different stages of their insurance plan.   PA #/Case ID/Reference #: [id number]

## 2024-05-13 NOTE — Telephone Encounter (Signed)
 Called pt and made her aware of approval she understood.

## 2024-05-13 NOTE — Telephone Encounter (Signed)
 Pharmacy Patient Advocate Encounter   Received notification from Pt Calls Messages that prior authorization for UBRELVY  100MG  is required/requested.   Insurance verification completed.   The patient is insured through [employer].   Per test claim: PA required; PA submitted to above mentioned insurance via Latent Key/confirmation #/EOC [id number] Status is pending

## 2024-05-14 ENCOUNTER — Other Ambulatory Visit (HOSPITAL_BASED_OUTPATIENT_CLINIC_OR_DEPARTMENT_OTHER): Payer: Self-pay

## 2024-05-14 ENCOUNTER — Other Ambulatory Visit: Payer: Self-pay | Admitting: Physician Assistant

## 2024-05-23 ENCOUNTER — Ambulatory Visit
Admission: RE | Admit: 2024-05-23 | Discharge: 2024-05-23 | Disposition: A | Source: Ambulatory Visit | Attending: Physician Assistant

## 2024-05-23 ENCOUNTER — Ambulatory Visit

## 2024-05-23 DIAGNOSIS — N644 Mastodynia: Secondary | ICD-10-CM

## 2024-05-29 ENCOUNTER — Other Ambulatory Visit (HOSPITAL_COMMUNITY): Payer: Self-pay

## 2024-10-02 ENCOUNTER — Ambulatory Visit: Admitting: Neurology
